# Patient Record
Sex: Male | Born: 1960 | Race: White | Hispanic: No | Marital: Single | Smoking: Never smoker
Health system: Southern US, Community
[De-identification: ages and names within clinical notes are randomized; demographics above are authoritative.]

## PROBLEM LIST (undated history)

## (undated) DIAGNOSIS — E119 Type 2 diabetes mellitus without complications: Secondary | ICD-10-CM

## (undated) DIAGNOSIS — H409 Unspecified glaucoma: Secondary | ICD-10-CM

## (undated) DIAGNOSIS — E785 Hyperlipidemia, unspecified: Secondary | ICD-10-CM

## (undated) DIAGNOSIS — I10 Essential (primary) hypertension: Secondary | ICD-10-CM

## (undated) HISTORY — PX: REPLACEMENT TOTAL KNEE BILATERAL: SUR1225

## (undated) HISTORY — PX: OTHER SURGICAL HISTORY: SHX169

## (undated) HISTORY — PX: TOTAL SHOULDER REPLACEMENT: SUR1217

---

## 2005-07-19 ENCOUNTER — Emergency Department: Payer: Self-pay | Admitting: Emergency Medicine

## 2017-02-18 ENCOUNTER — Inpatient Hospital Stay
Admission: EM | Admit: 2017-02-18 | Discharge: 2017-02-20 | DRG: 638 | Disposition: A | Discharge status: Home / Self Care | Payer: Medicare Other | Attending: Internal Medicine | Admitting: Internal Medicine

## 2017-02-18 ENCOUNTER — Encounter: Payer: Self-pay | Admitting: Emergency Medicine

## 2017-02-18 ENCOUNTER — Emergency Department: Payer: Medicare Other

## 2017-02-18 DIAGNOSIS — Z794 Long term (current) use of insulin: Secondary | ICD-10-CM

## 2017-02-18 DIAGNOSIS — E785 Hyperlipidemia, unspecified: Secondary | ICD-10-CM | POA: Diagnosis present

## 2017-02-18 DIAGNOSIS — Z96611 Presence of right artificial shoulder joint: Secondary | ICD-10-CM | POA: Diagnosis present

## 2017-02-18 DIAGNOSIS — I998 Other disorder of circulatory system: Secondary | ICD-10-CM | POA: Diagnosis present

## 2017-02-18 DIAGNOSIS — I10 Essential (primary) hypertension: Secondary | ICD-10-CM | POA: Diagnosis present

## 2017-02-18 DIAGNOSIS — L598 Other specified disorders of the skin and subcutaneous tissue related to radiation: Secondary | ICD-10-CM | POA: Diagnosis present

## 2017-02-18 DIAGNOSIS — T68XXXA Hypothermia, initial encounter: Secondary | ICD-10-CM | POA: Diagnosis present

## 2017-02-18 DIAGNOSIS — Y842 Radiological procedure and radiotherapy as the cause of abnormal reaction of the patient, or of later complication, without mention of misadventure at the time of the procedure: Secondary | ICD-10-CM | POA: Diagnosis present

## 2017-02-18 DIAGNOSIS — Z79899 Other long term (current) drug therapy: Secondary | ICD-10-CM

## 2017-02-18 DIAGNOSIS — R634 Abnormal weight loss: Secondary | ICD-10-CM | POA: Diagnosis present

## 2017-02-18 DIAGNOSIS — F1010 Alcohol abuse, uncomplicated: Secondary | ICD-10-CM | POA: Diagnosis present

## 2017-02-18 DIAGNOSIS — E876 Hypokalemia: Secondary | ICD-10-CM | POA: Diagnosis present

## 2017-02-18 DIAGNOSIS — G8929 Other chronic pain: Secondary | ICD-10-CM | POA: Diagnosis present

## 2017-02-18 DIAGNOSIS — I4581 Long QT syndrome: Secondary | ICD-10-CM | POA: Diagnosis present

## 2017-02-18 DIAGNOSIS — E11622 Type 2 diabetes mellitus with other skin ulcer: Secondary | ICD-10-CM | POA: Diagnosis present

## 2017-02-18 DIAGNOSIS — X58XXXA Exposure to other specified factors, initial encounter: Secondary | ICD-10-CM | POA: Diagnosis present

## 2017-02-18 DIAGNOSIS — L97919 Non-pressure chronic ulcer of unspecified part of right lower leg with unspecified severity: Secondary | ICD-10-CM | POA: Diagnosis present

## 2017-02-18 DIAGNOSIS — E162 Hypoglycemia, unspecified: Secondary | ICD-10-CM

## 2017-02-18 DIAGNOSIS — E11649 Type 2 diabetes mellitus with hypoglycemia without coma: Secondary | ICD-10-CM | POA: Diagnosis present

## 2017-02-18 DIAGNOSIS — Z96653 Presence of artificial knee joint, bilateral: Secondary | ICD-10-CM | POA: Diagnosis present

## 2017-02-18 DIAGNOSIS — H409 Unspecified glaucoma: Secondary | ICD-10-CM | POA: Diagnosis present

## 2017-02-18 HISTORY — DX: Unspecified glaucoma: H40.9

## 2017-02-18 HISTORY — DX: Hyperlipidemia, unspecified: E78.5

## 2017-02-18 HISTORY — DX: Essential (primary) hypertension: I10

## 2017-02-18 HISTORY — DX: Type 2 diabetes mellitus without complications: E11.9

## 2017-02-18 LAB — GLUCOSE, CAPILLARY
GLUCOSE-CAPILLARY: 138 mg/dL — AB (ref 65–99)
GLUCOSE-CAPILLARY: 70 mg/dL (ref 65–99)
GLUCOSE-CAPILLARY: 135 mg/dL — AB (ref 65–99)
GLUCOSE-CAPILLARY: 137 mg/dL — AB (ref 65–99)
GLUCOSE-CAPILLARY: 162 mg/dL — AB (ref 65–99)
GLUCOSE-CAPILLARY: 234 mg/dL — AB (ref 65–99)
Glucose-Capillary: 120 mg/dL — ABNORMAL HIGH (ref 65–99)
Glucose-Capillary: 124 mg/dL — ABNORMAL HIGH (ref 65–99)
Glucose-Capillary: 125 mg/dL — ABNORMAL HIGH (ref 65–99)
Glucose-Capillary: 151 mg/dL — ABNORMAL HIGH (ref 65–99)
Glucose-Capillary: 243 mg/dL — ABNORMAL HIGH (ref 65–99)
Glucose-Capillary: 255 mg/dL — ABNORMAL HIGH (ref 65–99)
Glucose-Capillary: 29 mg/dL — CL (ref 65–99)

## 2017-02-18 LAB — CBC
HEMATOCRIT: 50.1 % (ref 40.0–52.0)
Hemoglobin: 17.5 g/dL (ref 13.0–18.0)
MCH: 32 pg (ref 26.0–34.0)
MCHC: 35 g/dL (ref 32.0–36.0)
MCV: 91.4 fL (ref 80.0–100.0)
Platelets: 194 10*3/uL (ref 150–440)
RBC: 5.48 MIL/uL (ref 4.40–5.90)
RDW: 13.8 % (ref 11.5–14.5)
WBC: 9.8 10*3/uL (ref 3.8–10.6)

## 2017-02-18 LAB — URINE DRUG SCREEN, QUALITATIVE (ARMC ONLY)
Amphetamines, Ur Screen: NOT DETECTED
Barbiturates, Ur Screen: NOT DETECTED
Benzodiazepine, Ur Scrn: NOT DETECTED
CANNABINOID 50 NG, UR ~~LOC~~: NOT DETECTED
COCAINE METABOLITE, UR ~~LOC~~: NOT DETECTED
MDMA (ECSTASY) UR SCREEN: NOT DETECTED
Methadone Scn, Ur: NOT DETECTED
OPIATE, UR SCREEN: POSITIVE — AB
PHENCYCLIDINE (PCP) UR S: NOT DETECTED
Tricyclic, Ur Screen: POSITIVE — AB

## 2017-02-18 LAB — COMPREHENSIVE METABOLIC PANEL
ALBUMIN: 4.8 g/dL (ref 3.5–5.0)
ALT: 40 U/L (ref 17–63)
AST: 39 U/L (ref 15–41)
Alkaline Phosphatase: 84 U/L (ref 38–126)
Anion gap: 9 (ref 5–15)
BILIRUBIN TOTAL: 1.2 mg/dL (ref 0.3–1.2)
BUN: 10 mg/dL (ref 6–20)
CO2: 29 mmol/L (ref 22–32)
Calcium: 9.2 mg/dL (ref 8.9–10.3)
Chloride: 100 mmol/L — ABNORMAL LOW (ref 101–111)
Creatinine, Ser: 0.44 mg/dL — ABNORMAL LOW (ref 0.61–1.24)
GFR calc Af Amer: >60 mL/min (ref >60)
GFR calc non Af Amer: >60 mL/min (ref >60)
GLUCOSE: 63 mg/dL — AB (ref 65–99)
POTASSIUM: 2.9 mmol/L — AB (ref 3.5–5.1)
Sodium: 138 mmol/L (ref 135–145)
TOTAL PROTEIN: 8.5 g/dL — AB (ref 6.5–8.1)

## 2017-02-18 LAB — URINALYSIS, COMPLETE (UACMP) WITH MICROSCOPIC
BACTERIA UA: NONE SEEN
Bilirubin Urine: NEGATIVE
Glucose, UA: >=500 mg/dL — AB
Hgb urine dipstick: NEGATIVE
Ketones, ur: NEGATIVE mg/dL
Leukocytes, UA: NEGATIVE
Nitrite: NEGATIVE
Protein, ur: 100 mg/dL — AB
SPECIFIC GRAVITY, URINE: 1.009 (ref 1.005–1.030)
SQUAMOUS EPITHELIAL / LPF: NONE SEEN
pH: 7 (ref 5.0–8.0)

## 2017-02-18 LAB — BLOOD GAS, VENOUS
Acid-base deficit: 0 mmol/L (ref 0.0–2.0)
BICARBONATE: 26 mmol/L (ref 20.0–28.0)
O2 Saturation: 89 %
PH VEN: 7.36 (ref 7.250–7.430)
PO2 VEN: 59 mmHg — AB (ref 32.0–45.0)
Patient temperature: 37
pCO2, Ven: 46 mmHg (ref 44.0–60.0)

## 2017-02-18 LAB — LACTIC ACID, PLASMA: LACTIC ACID, VENOUS: 1.2 mmol/L (ref 0.5–1.9)

## 2017-02-18 LAB — TROPONIN I: Troponin I: <0.03 ng/mL (ref <0.03)

## 2017-02-18 LAB — ETHANOL

## 2017-02-18 MED ORDER — POTASSIUM CHLORIDE CRYS ER 20 MEQ PO TBCR
40.0000 meq | EXTENDED_RELEASE_TABLET | Freq: Once | ORAL | Status: AC
  Administered 2017-02-18: 40 meq via ORAL
  Filled 2017-02-18: qty 2

## 2017-02-18 MED ORDER — DEXTROSE 50 % IV SOLN
1.0000 | Freq: Once | INTRAVENOUS | Status: AC
  Administered 2017-02-18: 50 mL via INTRAVENOUS

## 2017-02-18 MED ORDER — COLLAGENASE 250 UNIT/GM EX OINT
TOPICAL_OINTMENT | Freq: Every day | CUTANEOUS | Status: DC
  Administered 2017-02-18 – 2017-02-19 (×2): via TOPICAL
  Filled 2017-02-18: qty 30

## 2017-02-18 MED ORDER — MAGNESIUM SULFATE 2 GM/50ML IV SOLN
2.0000 g | Freq: Once | INTRAVENOUS | Status: AC
  Administered 2017-02-18: 2 g via INTRAVENOUS

## 2017-02-18 MED ORDER — DEXTROSE 5 % IV SOLN
2.0000 g | Freq: Once | INTRAVENOUS | Status: AC
  Administered 2017-02-18: 2 g via INTRAVENOUS
  Filled 2017-02-18: qty 2

## 2017-02-18 MED ORDER — ACETAMINOPHEN 325 MG PO TABS
650.0000 mg | ORAL_TABLET | Freq: Four times a day (QID) | ORAL | Status: DC | PRN
  Administered 2017-02-18: 12:00:00 650 mg via ORAL
  Filled 2017-02-18: qty 2

## 2017-02-18 MED ORDER — VITAMIN B-1 100 MG PO TABS
100.0000 mg | ORAL_TABLET | Freq: Every day | ORAL | Status: DC
  Administered 2017-02-19 – 2017-02-20 (×2): 100 mg via ORAL
  Filled 2017-02-18 (×2): qty 1

## 2017-02-18 MED ORDER — DORZOLAMIDE HCL-TIMOLOL MAL PF 22.3-6.8 MG/ML OP SOLN
1.0000 [drp] | Freq: Two times a day (BID) | OPHTHALMIC | Status: DC
  Administered 2017-02-18 – 2017-02-20 (×5): 1 [drp] via OPHTHALMIC
  Filled 2017-02-18 (×6): qty 0.1

## 2017-02-18 MED ORDER — POTASSIUM CHLORIDE 2 MEQ/ML IV SOLN
30.0000 meq | Freq: Once | INTRAVENOUS | Status: AC
  Administered 2017-02-18: 30 meq via INTRAVENOUS
  Filled 2017-02-18: qty 15

## 2017-02-18 MED ORDER — LORAZEPAM 1 MG PO TABS
1.0000 mg | ORAL_TABLET | Freq: Four times a day (QID) | ORAL | Status: DC | PRN
  Administered 2017-02-18: 21:00:00 1 mg via ORAL
  Filled 2017-02-18: qty 1

## 2017-02-18 MED ORDER — LORAZEPAM 2 MG/ML IJ SOLN: 1.0000 mg | Freq: Four times a day (QID) | INTRAMUSCULAR | Status: DC | PRN

## 2017-02-18 MED ORDER — DEXTROSE 10 % IV SOLN
INTRAVENOUS | Status: DC
  Administered 2017-02-18: 07:00:00 via INTRAVENOUS

## 2017-02-18 MED ORDER — DEXTROSE 50 % IV SOLN
INTRAVENOUS | Status: AC
  Administered 2017-02-18: 50 mL via INTRAVENOUS
  Filled 2017-02-18: qty 50

## 2017-02-18 MED ORDER — THIAMINE HCL 100 MG/ML IJ SOLN
100.0000 mg | INTRAMUSCULAR | Status: AC
  Administered 2017-02-18: 100 mg via INTRAVENOUS

## 2017-02-18 MED ORDER — MAGNESIUM SULFATE 2 GM/50ML IV SOLN
INTRAVENOUS | Status: AC
  Filled 2017-02-18: qty 50

## 2017-02-18 MED ORDER — PANTOPRAZOLE SODIUM 40 MG PO TBEC
40.0000 mg | DELAYED_RELEASE_TABLET | Freq: Every day | ORAL | Status: DC
  Administered 2017-02-18 – 2017-02-20 (×3): 40 mg via ORAL
  Filled 2017-02-18 (×3): qty 1

## 2017-02-18 MED ORDER — THIAMINE HCL 100 MG/ML IJ SOLN
INTRAMUSCULAR | Status: AC
  Filled 2017-02-18: qty 2

## 2017-02-18 MED ORDER — DEXTROSE 5 % IV SOLN
2.0000 g | Freq: Two times a day (BID) | INTRAVENOUS | Status: DC
  Administered 2017-02-18: 2 g via INTRAVENOUS
  Filled 2017-02-18 (×3): qty 2

## 2017-02-18 MED ORDER — MORPHINE SULFATE 15 MG PO TABS
30.0000 mg | ORAL_TABLET | Freq: Two times a day (BID) | ORAL | Status: DC | PRN
  Administered 2017-02-18 – 2017-02-20 (×2): 30 mg via ORAL
  Filled 2017-02-18 (×2): qty 2

## 2017-02-18 MED ORDER — SENNOSIDES-DOCUSATE SODIUM 8.6-50 MG PO TABS
1.0000 | ORAL_TABLET | Freq: Every day | ORAL | Status: DC
  Administered 2017-02-19 – 2017-02-20 (×2): 1 via ORAL
  Filled 2017-02-18 (×2): qty 1

## 2017-02-18 MED ORDER — VANCOMYCIN HCL 500 MG IV SOLR
500.0000 mg | INTRAVENOUS | Status: AC
  Administered 2017-02-18: 13:00:00 500 mg via INTRAVENOUS
  Filled 2017-02-18: qty 500

## 2017-02-18 MED ORDER — PNEUMOCOCCAL VAC POLYVALENT 25 MCG/0.5ML IJ INJ: 0.5000 mL | INJECTION | INTRAMUSCULAR | Status: DC

## 2017-02-18 MED ORDER — THIAMINE HCL 100 MG/ML IJ SOLN: 100.0000 mg | Freq: Every day | INTRAMUSCULAR | Status: DC

## 2017-02-18 MED ORDER — FOLIC ACID 1 MG PO TABS
1.0000 mg | ORAL_TABLET | Freq: Every day | ORAL | Status: DC
  Administered 2017-02-18 – 2017-02-20 (×3): 1 mg via ORAL
  Filled 2017-02-18 (×3): qty 1

## 2017-02-18 MED ORDER — VANCOMYCIN HCL IN DEXTROSE 1-5 GM/200ML-% IV SOLN
1000.0000 mg | Freq: Three times a day (TID) | INTRAVENOUS | Status: DC
  Administered 2017-02-18 – 2017-02-19 (×2): 1000 mg via INTRAVENOUS
  Filled 2017-02-18 (×5): qty 200

## 2017-02-18 MED ORDER — ACETAMINOPHEN 650 MG RE SUPP: 650.0000 mg | Freq: Four times a day (QID) | RECTAL | Status: DC | PRN

## 2017-02-18 MED ORDER — OXYCODONE HCL 5 MG PO TABS
5.0000 mg | ORAL_TABLET | Freq: Four times a day (QID) | ORAL | Status: DC | PRN
  Administered 2017-02-18 – 2017-02-19 (×4): 5 mg via ORAL
  Filled 2017-02-18 (×4): qty 1

## 2017-02-18 MED ORDER — ENOXAPARIN SODIUM 40 MG/0.4ML ~~LOC~~ SOLN
40.0000 mg | SUBCUTANEOUS | Status: DC
  Administered 2017-02-18 – 2017-02-19 (×2): 40 mg via SUBCUTANEOUS
  Filled 2017-02-18 (×2): qty 0.4

## 2017-02-18 MED ORDER — ONDANSETRON HCL 4 MG/2ML IJ SOLN
4.0000 mg | Freq: Four times a day (QID) | INTRAMUSCULAR | Status: DC | PRN
  Administered 2017-02-18: 11:00:00 4 mg via INTRAVENOUS
  Filled 2017-02-18: qty 2

## 2017-02-18 MED ORDER — VANCOMYCIN HCL 500 MG IV SOLR
500.0000 mg | INTRAVENOUS | Status: DC
  Filled 2017-02-18: qty 500

## 2017-02-18 MED ORDER — ADULT MULTIVITAMIN W/MINERALS CH
1.0000 | ORAL_TABLET | Freq: Every day | ORAL | Status: DC
  Administered 2017-02-18 – 2017-02-20 (×3): 1 via ORAL
  Filled 2017-02-18 (×3): qty 1

## 2017-02-18 MED ORDER — VANCOMYCIN HCL IN DEXTROSE 1-5 GM/200ML-% IV SOLN
1000.0000 mg | Freq: Once | INTRAVENOUS | Status: AC
  Administered 2017-02-18: 11:00:00 1000 mg via INTRAVENOUS
  Filled 2017-02-18: qty 200

## 2017-02-18 NOTE — Progress Notes (Signed)
Complete dressing change done to right lower leg ulceration

## 2017-02-18 NOTE — ED Notes (Signed)
Patient denies pain and is resting comfortably.  

## 2017-02-18 NOTE — Consult Note (Signed)
WOC Nurse wound consult note Reason for Consult:1. Ischemic leg ulcer, with fat layer exposed  2. 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  Wound type:vascular/radionecrosis Pressure Injury POA: n/a Measurement: 6 cm x 3 cm x 0.3 cm  Wound bed: devitalized tissue Drainage (amount, consistency, odor) moderate purulent  Foul odor Periwound: edema, erythema, tenderness Dressing procedure/placement/frequency:[facility]  Not available on formulary.  Due to devitalized tissue, will implement enzymatic debridement.  Receiving IV Maxipime.   Cleanse wound to right lateral lower leg with NS.  Apply Santyl to wound bed.  Cover with NS moist gauze.  Cover with ABD pad and kelrix.  Change daily.  Will not follow at this time.  Please re-consult if needed.  Maple HudsonKaren Tressia Labrum RN BSN CWON Pager [phone]

## 2017-02-18 NOTE — Progress Notes (Signed)
Inpatient Diabetes Program Recommendations  AACE/ADA: New Consensus Statement on Inpatient Glycemic Control (2015)  Target Ranges:  Prepandial:   less than 140 mg/dL      Peak postprandial:   less than 180 mg/dL (1-2 hours)      Critically ill patients:  140 - 180 mg/dL   Lab Results  Component Value Date   GLUCAP 124 (H) 02/18/2017    Review of Glycemic Control: Results for Isaac Morrison, Isaac Morrison (MRN [id number]) as of 02/18/2017 09:39  Ref. Range 02/18/2017 05:54 02/18/2017 07:23 02/18/2017 07:24 02/18/2017 07:52 02/18/2017 09:01  Glucose-Capillary Latest Ref Range: 65 - 99 mg/dL 409138 (H) 26 (LL) 29 (LL) 151 (H) 124 (H)   Results for Isaac Morrison, [name] (MRN [id number]) as of 02/18/2017 14:18  Ref. Range 02/18/2017 09:25 02/18/2017 10:44 02/18/2017 12:12  Glucose-Capillary Latest Ref Range: 65 - 99 mg/dL 956120 (H) 70 213125 (H)    Diabetes history: Type 2 diabetes- Last A1C per chart review was 5.8% indicating average glucose 120 mg/dL Outpatient Diabetes medications: 70/30 66 units bid Current orders for Inpatient glycemic control:  D10 at 50 cc/hr with hourly blood glucose monitoring  Inpatient Diabetes Program Recommendations:    Attempted to speak with patient however he states he is in pain.  I was able to confirm that patient was on 70/30 66 units bid.  When I asked him if he ate yesterday, he stated "I don't know". Stepmother was at the bedside and stated that brother was unable to wake patient up on the couch this morning, so he called 911.  She states that he was in the hospital in February for low blood sugars, however I do not see any visits in "Care Everywhere"?  Based on patient's A1C and admitting diagnosis of "Hypoglycemia", he will need reduction in home insulin.  Further if patient's intake is not consistent, may do better with insulin that is not pre-mixed since these insulins include meal coverage and basal in one injection.  Will follow.  Beryl MeagerJenny Ardis Lawley, RN, BC-ADM Inpatient Diabetes Coordinator Pager  [phone] (8a-5p)

## 2017-02-18 NOTE — ED Provider Notes (Signed)
[facility] Emergency Department Provider Note _   First MD Initiated Contact with Patient 02/18/17 [phone]     (approximate)  I have reviewed the triage vital signs and the nursing notes.   HISTORY  Chief Complaint Hypoglycemia   HPI Isaac Morrison is a 56 y.o. male with history diabetes presents via EMS. Per EMS call was placed secondary to altered mental status. On their arrival the patient's glucose noted to be 31 and a such 2 Amp of D50 was administered glucose 150. EMS also noted that the patient's temperature on arrival was "90". Patient denies any complaints at this time. Patient stepped are noted to be 93.1 on arrival to the emergency department. Glucose on arrival 137   Past Medical History:  Diagnosis Date  . Diabetes mellitus without complication (HCC)     There are no active problems to display for this patient.   History reviewed. No pertinent surgical history.  Prior to Admission medications   Not on File    Allergies Patient has no known allergies.  History reviewed. No pertinent family history.  Social History Social History  Substance Use Topics  . Smoking status: Never Smoker  . Smokeless tobacco: Never Used  . Alcohol use No    Review of Systems Constitutional: No fever/chills Eyes: No visual changes. ENT: No sore throat. Cardiovascular: Denies chest pain. Respiratory: Denies shortness of breath. Gastrointestinal: No abdominal pain.  No nausea, no vomiting.  No diarrhea.  No constipation. Genitourinary: Negative for dysuria. Musculoskeletal: Negative for back pain. Skin: Negative for rash. Neurological: Negative for headaches, focal weakness or numbness. Endocrine:Positive for hypoglycemia  Hematological/Lymphatic:  10-point ROS otherwise negative.  ____________________________________________   PHYSICAL EXAM:  VITAL SIGNS: ED Triage Vitals [02/18/17 0600]  Enc Vitals Group     BP (!) 169/99     Pulse  Rate 77     Resp 17     Temp (!) 93.1 F (33.9 C)     Temp Source Rectal     SpO2 98 %     Weight 189 lb 8 oz (86 kg)     Height      Head Circumference      Peak Flow      Pain Score      Pain Loc      Pain Edu?      Excl. in GC?     Constitutional: Alert and oriented. Well appearing and in no acute distress. Eyes: Conjunctivae are normal. PERRL. EOMI. Head: Atraumatic. Ears:  Healthy appearing ear canals and TMs bilaterally Nose: No congestion/rhinnorhea. Mouth/Throat: Mucous membranes are moist.  Oropharynx non-erythematous. Neck: No stridor.  No meningeal signs.  No cervical spine tenderness to palpation. Cardiovascular: Normal rate, regular rhythm. Good peripheral circulation. Grossly normal heart sounds. Respiratory: Normal respiratory effort.  No retractions. Lungs CTAB. Gastrointestinal: Soft and nontender. No distention.  Musculoskeletal: No lower extremity tenderness nor edema. No gross deformities of extremities. Neurologic:  Normal speech and language. No gross focal neurologic deficits are appreciated.  Skin:  Skin is warm, dry and intact. No rash noted. Psychiatric: Mood and affect are normal. Speech and behavior are normal.  ____________________________________________   LABS (all labs ordered are listed, but only abnormal results are displayed)  Labs Reviewed  COMPREHENSIVE METABOLIC PANEL - Abnormal; Notable for the following:       Result Value   Potassium 2.9 (*)    Chloride 100 (*)    Glucose, Bld 63 (*)  Creatinine, Ser 0.44 (*)    Total Protein 8.5 (*)    All other components within normal limits  URINE DRUG SCREEN, QUALITATIVE ([facility] ONLY) - Abnormal; Notable for the following:    Tricyclic, Ur Screen POSITIVE (*)    Opiate, Ur Screen POSITIVE (*)    All other components within normal limits  URINALYSIS, COMPLETE (UACMP) WITH MICROSCOPIC - Abnormal; Notable for the following:    Color, Urine YELLOW (*)    APPearance CLEAR (*)    Glucose, UA  >=500 (*)    Protein, ur 100 (*)    All other components within normal limits  CULTURE, BLOOD (SINGLE)  CBC  TROPONIN I  ETHANOL  LACTIC ACID, PLASMA  LACTIC ACID, PLASMA  BLOOD GAS, VENOUS  CBG MONITORING, ED   ____________________________________________  EKG  ED ECG REPORT I, Clyde N Landyn Buckalew, the attending physician, personally viewed and interpreted this ECG.   Date: 02/18/2017  EKG Time: 6:02 AM  Rate: 87  Rhythm: Normal sinus rhythm  Axis: Normal  Intervals: Normal  ST&T Change: None  ____________________________________________  RADIOLOGY I, [place] N Ebone Alcivar, personally viewed and evaluated these images (plain radiographs) as part of my medical decision making, as well as reviewing the written report by the radiologist.  Dg Chest Port 1 View  Result Date: 02/18/2017 CLINICAL DATA:  Hypothermia EXAM: PORTABLE CHEST 1 VIEW COMPARISON:  None. FINDINGS: A single AP portable view of the chest demonstrates no focal airspace consolidation or alveolar edema. The lungs are grossly clear. There is no large effusion or pneumothorax. Cardiac and mediastinal contours appear unremarkable. IMPRESSION: No active disease. Electronically Signed   By: Ellery Plunk M.D.   On: 02/18/2017 06:36      Procedures   Critical Care performed:CRITICAL CARE Performed by: Darci Current   Total critical care time: 30 minutes  Critical care time was exclusive of separately billable procedures and treating other patients.  Critical care was necessary to treat or prevent imminent or life-threatening deterioration.  Critical care was time spent personally by me on the following activities: development of treatment plan with patient and/or surrogate as well as nursing, discussions with consultants, evaluation of patient's response to treatment, examination of patient, obtaining history from patient or surrogate, ordering and performing treatments and interventions, ordering and review  of laboratory studies, ordering and review of radiographic studies, pulse oximetry and re-evaluation of patient's condition.  ___________________   INITIAL IMPRESSION / ASSESSMENT AND PLAN / ED COURSE  Pertinent labs & imaging results that were available during my care of the patient were reviewed by me and considered in my medical decision making (see chart for details).  Glucose decreased again the 26 and a such an amp of D50 was given D10 normal saline at per hour started the patient current temperature is 95 degrees bear hugger in place      ____________________________________________  FINAL CLINICAL IMPRESSION(S) / ED DIAGNOSES  Final diagnoses:  Hypoglycemia  Hypothermia, initial encounter     MEDICATIONS GIVEN DURING THIS VISIT:  Medications - No data to display   NEW OUTPATIENT MEDICATIONS STARTED DURING THIS VISIT:  New Prescriptions   No medications on file    Modified Medications   No medications on file    Discontinued Medications   No medications on file     Note:  This document was prepared using Dragon voice recognition software and may include unintentional dictation errors.    Darci Current, MD 02/18/17 [phone]

## 2017-02-18 NOTE — H&P (Signed)
[facility] [place] [facility]   PATIENT NAME: Isaac Morrison    MR#:  [id number]  DATE OF BIRTH:  1961/09/27  DATE OF ADMISSION:  02/18/2017  PRIMARY CARE PHYSICIAN: Gilmer Mor   REQUESTING/REFERRING PHYSICIAN: Dr. Manson Passey  CHIEF COMPLAINT:   Chief Complaint  Patient presents with  . Hypoglycemia    HISTORY OF PRESENT ILLNESS:  Joffre Lucks  is a 56 y.o. male with a known history of Diabetes, chronic pain. Was unresponsive this morning and unable to be woken up by family. He was seen well in the evening last night. At 4:30 AM family so the TV and lites were on in the oven was on. They were unable to wake him up and called 911. Patient was found to be hypothermic and hypoglycemic in the ER and started on a D10 drip for recurrent low sugars. The patient is oriented at this time. 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 Is feeling thirsty and his leg hurts. Patient also drinks 6-7 beers per day. He was cold yesterday and was wearing an electric blanket. He also complains of some weight loss  PAST MEDICAL HISTORY:   Past Medical History:  Diagnosis Date  . Diabetes mellitus without complication (HCC)   . Glaucoma   . Hyperlipidemia   . Hypertension     PAST SURGICAL HISTORY:   Past Surgical History:  Procedure Laterality Date  . REPLACEMENT TOTAL KNEE BILATERAL Bilateral   . Skin grafts    . TOTAL SHOULDER REPLACEMENT Right     SOCIAL HISTORY:   Social History  Substance Use Topics  . Smoking status: Never Smoker  . Smokeless tobacco: Never Used  . Alcohol use 29.4 oz/week    49 Cans of beer per week    FAMILY HISTORY:   Family History  Problem Relation Age of Onset  . Alzheimer's disease Father     DRUG ALLERGIES:  No Known Allergies  REVIEW OF SYSTEMS:  CONSTITUTIONAL: No fever, Cold yesterday wearing an electric blanket. Positive for weight loss. Some fatigue. Some headache. EYES: No blurred or  double vision. Some right eye pain EARS, NOSE, AND THROAT: No tinnitus or ear pain. No sore throat RESPIRATORY: No cough, shortness of breath, wheezing or hemoptysis.  CARDIOVASCULAR: No chest pain, orthopnea, edema.  GASTROINTESTINAL: No nausea, vomiting, diarrhea or abdominal pain. No blood in bowel movements GENITOURINARY: No dysuria, hematuria.  ENDOCRINE: No polyuria, nocturia,  HEMATOLOGY: No anemia, easy bruising or bleeding SKIN: No rash or lesion. MUSCULOSKELETAL: Right leg pain, bilateral knee pain NEUROLOGIC: No tingling, numbness, weakness.  PSYCHIATRY: No anxiety or depression.   MEDICATIONS AT HOME:   Prior to Admission medications   Medication Sig Start Date End Date Taking? Authorizing Provider  benazepril (LOTENSIN) 20 MG tablet Take 20 mg by mouth daily. 04/08/13  Yes Historical Provider, MD  dorzolamide-timolol (COSOPT) 22.3-6.8 MG/ML ophthalmic solution Place 1 drop into both eyes 2 (two) times daily. 01/16/17  Yes Historical Provider, MD  fenofibrate (TRICOR) 145 MG tablet Take 145 mg by mouth daily.   Yes Historical Provider, MD  hydrochlorothiazide (HYDRODIURIL) 25 MG tablet Take 25 mg by mouth daily. 04/08/13  Yes Historical Provider, MD  insulin NPH-regular Human (HUMULIN 70/30) (70-30) 100 UNIT/ML injection Inject 66 Units into the skin 2 (two) times daily with a meal. 04/25/16  Yes Historical Provider, MD  morphine (MSIR) 30 MG tablet Take 60 mg by mouth 2 (two) times daily as needed for pain.  01/29/17  Yes Historical Provider, MD  Omega-3 Fatty Acids (FISH OIL) 1000 MG CAPS Take 2,000 mg by mouth 4 (four) times daily.   Yes Historical Provider, MD  omeprazole (PRILOSEC) 20 MG capsule Take 20 mg by mouth daily. 12/12/16  Yes Historical Provider, MD  pravastatin (PRAVACHOL) 80 MG tablet Take 80 mg by mouth at bedtime. 12/04/16  Yes Historical Provider, MD  senna-docusate (SENOKOT-S) 8.6-50 MG tablet Take 1 tablet by mouth daily. 10/15/16 10/15/17 Yes Historical Provider,  MD  tadalafil (CIALIS) 20 MG tablet Take 20 mg by mouth daily as needed. 09/08/13  Yes Historical Provider, MD      VITAL SIGNS:  Blood pressure (!) 146/99, pulse 77, temperature (!) 94.8 F (34.9 C), resp. rate 15, weight 86 kg (189 lb 8 oz), SpO2 99 %.  PHYSICAL EXAMINATION:  GENERAL:  56 y.o.-year-old patient lying in the bed with no acute distress.  EYES: Pupils equal, round, reactive to light and accommodation. No scleral icterus. Extraocular muscles intact.  HEENT: Head atraumatic, normocephalic. Oropharynx and nasopharynx clear.  NECK:  Supple, no jugular venous distention. No thyroid enlargement, no tenderness.  LUNGS: Normal breath sounds bilaterally, no wheezing, rales,rhonchi or crepitation. No use of accessory muscles of respiration.  CARDIOVASCULAR: S1, S2 normal. No murmurs, rubs, or gallops.  ABDOMEN: Soft, nontender, nondistended. Bowel sounds present. No organomegaly or mass.  EXTREMITIES: No pedal edema, cyanosis, or clubbing.  NEUROLOGIC: Cranial nerves II through XII are intact. Muscle strength 5/5 in all extremities. Sensation intact. Gait not checked.  PSYCHIATRIC: The patient is alert and oriented x 3.  SKIN: Right leg large ulceration with foul-smelling greenish discharge  LABORATORY PANEL:   CBC  Recent Labs Lab 02/18/17 0600  WBC 9.8  HGB 17.5  HCT 50.1  PLT 194   ------------------------------------------------------------------------------------------------------------------  Chemistries   Recent Labs Lab 02/18/17 0600  NA 138  K 2.9*  CL 100*  CO2 29  GLUCOSE 63*  BUN 10  CREATININE 0.44*  CALCIUM 9.2  AST 39  ALT 40  ALKPHOS 84  BILITOT 1.2   ------------------------------------------------------------------------------------------------------------------  Cardiac Enzymes  Recent Labs Lab 02/18/17 0600  TROPONINI <0.03    ------------------------------------------------------------------------------------------------------------------  RADIOLOGY:  Dg Chest Port 1 View  Result Date: 02/18/2017 CLINICAL DATA:  Hypothermia EXAM: PORTABLE CHEST 1 VIEW COMPARISON:  None. FINDINGS: A single AP portable view of the chest demonstrates no focal airspace consolidation or alveolar edema. The lungs are grossly clear. There is no large effusion or pneumothorax. Cardiac and mediastinal contours appear unremarkable. IMPRESSION: No active disease. Electronically Signed   By: Ellery Plunk M.D.   On: 02/18/2017 06:36    EKG:   Normal sinus rhythm 87 bpm  IMPRESSION AND PLAN:   1. Severe hypoglycemia. Patient states he only takes 7030 insulin 60 units twice a day. Check a hemoglobin A1c. Currently on D10 drip. Check fingersticks every 1. 2. Hypothermia. Rule out infection get blood cultures 2. Empiric antibiotics for right leg wound infection. Send off a wound culture. 3. Hypokalemia. Replace potassium orally and IV. Replace magnesium also. 4. Alcohol abuse. Must stop alcohol. CIWA protocol 5. Glaucoma continue eyedrops 6. Essential hypertension hold blood pressure medication at this time 7. Hyperlipidemia unspecified how cholesterol medication at this time 8. Chronic pain decreased pain medication dose 9. Right lower extremity wound. Cover at this time. Send a culture. Empiric antibiotics.   All the records are reviewed and case discussed with ED provider. Management plans discussed with the patient, family and they are  in agreement.  CODE STATUS: Full code  TOTAL TIME TAKING CARE OF THIS PATIENT: 55 minutes.    Alford HighlandWIETING, Deforrest Bogle M.D on 02/18/2017 at 7:59 AM  Between 7am to 6pm - Pager - [phone]  After 6pm call admission pager [phone]  [facility] Office  [phone]  CC: Primary care physician; Gilmer Moronald Fleming

## 2017-02-18 NOTE — ED Notes (Signed)
Bear Hugger and Rectal thermometer probe initiated.

## 2017-02-18 NOTE — ED Notes (Signed)
AAOx3.  Skin warm and dry.  NAD 

## 2017-02-18 NOTE — ED Notes (Signed)
bair hugger removed 

## 2017-02-18 NOTE — ED Triage Notes (Addendum)
Pt arrived to ED by EMS from home where he resides with brother. EMS reports pt found by brother with AMS, CBG on arrival 231, after 2 AMP's of D50 CBG 150. EMS reports pt has "cognitive disabilities," unknown per EMS. Pts family in route to ED. Pt rectal temp 93.72F.

## 2017-02-19 LAB — GLUCOSE, CAPILLARY
GLUCOSE-CAPILLARY: 26 mg/dL — AB (ref 65–99)
GLUCOSE-CAPILLARY: 187 mg/dL — AB (ref 65–99)
Glucose-Capillary: 149 mg/dL — ABNORMAL HIGH (ref 65–99)
Glucose-Capillary: 186 mg/dL — ABNORMAL HIGH (ref 65–99)
Glucose-Capillary: 221 mg/dL — ABNORMAL HIGH (ref 65–99)
Glucose-Capillary: 170 mg/dL — ABNORMAL HIGH (ref 65–99)
Glucose-Capillary: 129 mg/dL — ABNORMAL HIGH (ref 65–99)

## 2017-02-19 LAB — HIV ANTIBODY (ROUTINE TESTING W REFLEX): HIV Screen 4th Generation wRfx: NONREACTIVE

## 2017-02-19 LAB — BASIC METABOLIC PANEL
Anion gap: 6 (ref 5–15)
BUN: 11 mg/dL (ref 6–20)
CO2: 24 mmol/L (ref 22–32)
Calcium: 9 mg/dL (ref 8.9–10.3)
Chloride: 105 mmol/L (ref 101–111)
Creatinine, Ser: 0.69 mg/dL (ref 0.61–1.24)
GFR calc Af Amer: >60 mL/min (ref >60)
GLUCOSE: 188 mg/dL — AB (ref 65–99)
POTASSIUM: 4.2 mmol/L (ref 3.5–5.1)
Sodium: 135 mmol/L (ref 135–145)

## 2017-02-19 LAB — HEMOGLOBIN A1C
HEMOGLOBIN A1C: 5.5 % (ref 4.8–5.6)
MEAN PLASMA GLUCOSE: 111 mg/dL

## 2017-02-19 LAB — MAGNESIUM: MAGNESIUM: 2.2 mg/dL (ref 1.7–2.4)

## 2017-02-19 MED ORDER — INSULIN ASPART 100 UNIT/ML ~~LOC~~ SOLN: 0.0000 [IU] | Freq: Every day | SUBCUTANEOUS | Status: DC

## 2017-02-19 MED ORDER — DICLOXACILLIN SODIUM 500 MG PO CAPS
500.0000 mg | ORAL_CAPSULE | Freq: Three times a day (TID) | ORAL | Status: DC
  Administered 2017-02-19 – 2017-02-20 (×3): 500 mg via ORAL
  Filled 2017-02-19 (×4): qty 1

## 2017-02-19 MED ORDER — ENSURE ENLIVE PO LIQD
237.0000 mL | Freq: Two times a day (BID) | ORAL | Status: DC
  Administered 2017-02-20: 237 mL via ORAL

## 2017-02-19 MED ORDER — INSULIN ASPART 100 UNIT/ML ~~LOC~~ SOLN
0.0000 [IU] | Freq: Three times a day (TID) | SUBCUTANEOUS | Status: DC
  Administered 2017-02-19: 2 [IU] via SUBCUTANEOUS
  Filled 2017-02-19: qty 2

## 2017-02-19 NOTE — Progress Notes (Signed)
Responded to bed alarm. Pt. Educated Reason asked to call before walking, effects of hypoglycemia. Asked to Call before getting up and walking. Patient Stated. I'm not going to" When asked the reason. He said "cause Im going home, I'm walking on out of here if they don't let me. I explained to patient he was a fall risk if he were to fall he would be here even longer patient still refuse to call be getting up out of bed.

## 2017-02-19 NOTE — Progress Notes (Signed)
Patient ID: Isaac Morrison, male   DOB: 1961-07-15, 56 y.o.   MRN: [id number]  [facility] PROGRESS NOTE  Isaac Morrison WUJ:[id number] DOB: 08-02-1961 DOA: 02/18/2017 PCP: No PCP Per Patient  HPI/Subjective: Patient felt fine and wanted to go home. Patient's hemoglobin A1c was 5.5. Obviously the patient does not need 66 units twice a day of 70/30 insulin. So far just watching his sugars off insulin, I'm not quite sure what yet to prescribe.  Objective: Vitals:   02/19/17 0359 02/19/17 1337  BP: (!) 113/59 122/71  Pulse: 90 81  Resp: (!) 22 18  Temp: 98.7 F (37.1 C) 98.8 F (37.1 C)    Filed Weights   02/18/17 0600 02/18/17 1007  Weight: 86 kg (189 lb 8 oz) 85.2 kg (187 lb 14.4 oz)    ROS: Review of Systems  Constitutional: Negative for chills and fever.  Eyes: Negative for blurred vision.  Respiratory: Negative for cough and shortness of breath.   Cardiovascular: Negative for chest pain.  Gastrointestinal: Negative for abdominal pain, constipation, diarrhea, nausea and vomiting.  Genitourinary: Negative for dysuria.  Musculoskeletal: Negative for joint pain.  Neurological: Negative for dizziness and headaches.   Exam: Physical Exam  Constitutional: He is oriented to person, place, and time.  HENT:  Nose: No mucosal edema.  Mouth/Throat: No oropharyngeal exudate or posterior oropharyngeal edema.  Eyes: Conjunctivae, EOM and lids are normal. Pupils are equal, round, and reactive to light.  Neck: No JVD present. Carotid bruit is not present. No edema present. No thyroid mass and no thyromegaly present.  Cardiovascular: S1 normal and S2 normal.  Exam reveals no gallop.   No murmur heard. Pulses:      Dorsalis pedis pulses are 2+ on the right side, and 2+ on the left side.  Respiratory: No respiratory distress. He has no wheezes. He has no rhonchi. He has no rales.  GI: Soft. Bowel sounds are normal. There is no tenderness.  Musculoskeletal:       Right ankle: He  exhibits no swelling.       Left ankle: He exhibits no swelling.  Lymphadenopathy:    He has no cervical adenopathy.  Neurological: He is alert and oriented to person, place, and time. No cranial nerve deficit.  Skin: Skin is warm. Nails show no clubbing.  Large ulcer left leg with some drainage  Psychiatric: He has a normal mood and affect.      Data Reviewed: Basic Metabolic Panel:  Recent Labs Lab 02/18/17 0600 02/19/17 0435  NA 138 135  K 2.9* 4.2  CL 100* 105  CO2 29 24  GLUCOSE 63* 188*  BUN 10 11  CREATININE 0.44* 0.69  CALCIUM 9.2 9.0  MG  --  2.2   Liver Function Tests:  Recent Labs Lab 02/18/17 0600  AST 39  ALT 40  ALKPHOS 84  BILITOT 1.2  PROT 8.5*  ALBUMIN 4.8   CBC:  Recent Labs Lab 02/18/17 0600  WBC 9.8  HGB 17.5  HCT 50.1  MCV 91.4  PLT 194   Cardiac Enzymes:  Recent Labs Lab 02/18/17 0600  TROPONINI <0.03    CBG:  Recent Labs Lab 02/18/17 2156 02/18/17 2353 02/19/17 0401 02/19/17 0737 02/19/17 1153  GLUCAP 234* 255* 187* 149* 186*    Recent Results (from the past 240 hour(s))  CULTURE, BLOOD (ROUTINE X 2) w Reflex to ID Panel     Status: None (Preliminary result)   Collection Time: 02/18/17  6:13 AM  Result Value  Ref Range Status   Specimen Description BLOOD L AC  Final   Special Requests BOTTLES DRAWN AEROBIC AND ANAEROBIC BCAV  Final   Culture NO GROWTH < 24 HOURS  Final   Report Status PENDING  Incomplete  CULTURE, BLOOD (ROUTINE X 2) w Reflex to ID Panel     Status: None (Preliminary result)   Collection Time: 02/18/17  6:13 AM  Result Value Ref Range Status   Specimen Description BLOOD R AC  Final   Special Requests BOTTLES DRAWN AEROBIC AND ANAEROBIC BCAV  Final   Culture NO GROWTH < 24 HOURS  Final   Report Status PENDING  Incomplete  Aerobic/Anaerobic Culture (surgical/deep wound)     Status: None (Preliminary result)   Collection Time: 02/18/17 11:31 AM  Result Value Ref Range Status   Specimen  Description LEG RIGHT LOWER LEG  Final   Special Requests NONE  Final   Gram Stain   Final    ABUNDANT WBC PRESENT,BOTH PMN AND MONONUCLEAR ABUNDANT GRAM VARIABLE ROD ABUNDANT GRAM POSITIVE COCCI IN PAIRS Performed at [facility], [address] [place], [place] [address]    Culture PENDING  Incomplete   Report Status PENDING  Incomplete     Studies: Dg Chest Port 1 View  Result Date: 02/18/2017 CLINICAL DATA:  Hypothermia EXAM: PORTABLE CHEST 1 VIEW COMPARISON:  None. FINDINGS: A single AP portable view of the chest demonstrates no focal airspace consolidation or alveolar edema. The lungs are grossly clear. There is no large effusion or pneumothorax. Cardiac and mediastinal contours appear unremarkable. IMPRESSION: No active disease. Electronically Signed   By: Ellery Plunk M.D.   On: 02/18/2017 06:36    Scheduled Meds: . collagenase   Topical Daily  . dicloxacillin  500 mg Oral Q8H  . dorzolamidel-timolol  1 drop Both Eyes BID  . enoxaparin (LOVENOX) injection  40 mg Subcutaneous Q24H  . feeding supplement (ENSURE ENLIVE)  237 mL Oral BID WC  . folic acid  1 mg Oral Daily  . multivitamin with minerals  1 tablet Oral Daily  . pantoprazole  40 mg Oral Daily  . pneumococcal 23 valent vaccine  0.5 mL Intramuscular Tomorrow-1000  . senna-docusate  1 tablet Oral Daily  . thiamine  100 mg Oral Daily   Or  . thiamine  100 mg Intravenous Daily    Assessment/Plan:  1. Hypoglycemia which was persistent required D10 drip. Right now sugars are acceptable off insulin. Hemoglobin A1c 5.5. Obviously he does not need 66 units of 70/30 insulin twice a day. He potentially can use a small dose of Lantus with short acting insulin prior to meals. But I want to watch a few more sugars first. Potential disposition tomorrow. 2. Hypothermia resolved 3. Hypokalemia improved with replacement 4. Alcohol abuse. Must stop alcohol. No signs of withdrawal today 5. Essential hypertension. Current  medications still on hold 6. Hyperlipidemia unspecified. Hold cholesterol medication 7. Chronic pain. Continue decreased pain medication dose 8. Right lower extremity wound. Change IV antibiotics to dicloxacillin.  Code Status:     Code Status Orders        Start     Ordered   02/18/17 0754  Full code  Continuous     02/18/17 0754    Code Status History    Date Active Date Inactive Code Status Order ID Comments User Context   This patient has a current code status but no historical code status.     Family Communication: Brother on  phone Disposition Plan: Home tomorrow  Antibiotics:  dicloxacillin  Time spent: 25minutes  Alford HighlandWIETING, Jessee Mezera  [employer]

## 2017-02-19 NOTE — Progress Notes (Signed)
Initial Nutrition Assessment  DOCUMENTATION CODES:   Not applicable  INTERVENTION:  Provide Ensure Enlive po BID, each supplement provides 350 kcal and 20 grams of protein.   Continue multivitamin with minerals daily.  NUTRITION DIAGNOSIS:   Increased nutrient needs related to wound healing as evidenced by estimated needs.  GOAL:   Patient will meet greater than or equal to 90% of their needs  MONITOR:   PO intake, Supplement acceptance, Labs, Weight trends, I & O's  REASON FOR ASSESSMENT:   Malnutrition Screening Tool    ASSESSMENT:   56 year old male with PMHx of DM type 2, HTN, HLD who presents after being unresponsive and unable to be woken up by family. Per chart patient drinks 6-7 beers per day.    Spoke with patient at bedside. He was eating his breakfast on assessment. Patient seemed to be alert and oriented, but question if patient is a good historian as he was unable to provide many details. Patient reports his appetite is "improving" but when asked when his appetite was poor, he reports it was not poor PTA. Patient reports he eats 2-3 meals per day. Reports a typical meal may be spaghetti. He reports he has been trying to lose some weight lately so he has been eating less than normal lately, but did not provide any further details.   Patient reports UBW 200 lbs and he has lost weight in the past 6 months. This would be a weight loss of 12.1 lbs (6.1% body weight) over 6 months, which is not significant for time frame.  Medications reviewed and include: folic acid 1 mg daily, MVI daily, pantoprazole, senna, thiamine 100 mg daily, vancomycin.  Labs reviewed: CBG 26-255 since admission yesterday.   Nutrition-Focused physical exam completed. Findings are no fat depletion, no muscle depletion, and no edema.   Discussed with RN.   Patient does not meet criteria for malnutrition at this time. Patient is at risk for malnutrition due to EtOH abuse - will continue to  monitor during admission.  Diet Order:  Diet heart healthy/carb modified Room service appropriate? Yes; Fluid consistency: Thin  Skin:  Wound (see comment) (diabetic ulcer right leg)  Last BM:  02/18/2017  Height:   Ht Readings from Last 1 Encounters:  02/18/17 5\' 8"  (1.727 m)    Weight:   Wt Readings from Last 1 Encounters:  02/18/17 187 lb 14.4 oz (85.2 kg)    Ideal Body Weight:  70 kg  BMI:  Body mass index is 28.57 kg/m.  Estimated Nutritional Needs:   Kcal:  2000-2340 (MSJ x 1.2-1.4)  Protein:  85-100 grams (1-1.2 grams/kg)  Fluid:  2-2.3 L/day  EDUCATION NEEDS:   Education needs no appropriate at this time  Helane RimaLeanne Shunsuke Granzow, MS, RD, LDN Pager: [phone] After Hours Pager: [phone]

## 2017-02-19 NOTE — Progress Notes (Signed)
Pharmacy Antibiotic Note  Isaac Morrison is a 56 y.o. male admitted on 02/18/2017 with wound infection.  Pharmacy has been consulted for vancomycin and cefepime dosing.  Plan: Continue cefepime 2 g IV q12h  Patient is currently receiving vancomycin 1000 mg IV q8h. Goal vancomycin trough 15-20 mcg/mL for wound Vancomycin trough scheduled for 3/8 @ 1730 which is prior to 5th dose and should represent steady state  Height: 5\' 8"  (172.7 cm) Weight: 187 lb 14.4 oz (85.2 kg) IBW/kg (Calculated) : 68.4  Temp (24hrs), Avg:98.2 F (36.8 C), Min:97.6 F (36.4 C), Max:98.7 F (37.1 C)   Recent Labs Lab 02/18/17 0600 02/18/17 0613 02/19/17 0435  WBC 9.8  --   --   CREATININE 0.44*  --  0.69  LATICACIDVEN  --  1.2  --     Estimated Creatinine Clearance: 112.1 mL/min (by C-G formula based on SCr of 0.69 mg/dL).    No Known Allergies  Antimicrobials this admission: cefepime 3/7 >>  vancomycin 3/7 >>   Dose adjustments this admission:  Microbiology results: 3/7 BCx: No growth < 24 hours  Thank you for allowing pharmacy to be a part of this patient's care.  Cindi CarbonMary M Leiann Sporer, PharmD, BCPS Clinical Pharmacist 02/19/2017 9:19 AM

## 2017-02-19 NOTE — Evaluation (Signed)
Physical Therapy Evaluation Patient Details Name: Isaac Morrison MRN: [id number] DOB: Nov 10, 1961 Today's Date: 02/19/2017   History of Present Illness  56 y/o male here with hypoglycemia.  He has been dealing with a R LE ulcer with wound care needs.   Clinical Impression  Pt did well with PT and despite being impulsive and eager to get out of the hospital he showed good safety, confidence and speed with ambulation around the nurses' station and negotiation of stairs.  He reports that his R LE limp has been his normal since age 5 and does not need (nor is he interested in) further PT intervention.  Pt safe to go home from PT stand point.     Follow Up Recommendations No PT follow up    Equipment Recommendations       Recommendations for Other Services       Precautions / Restrictions Restrictions Weight Bearing Restrictions: No      Mobility  Bed Mobility Overal bed mobility: Independent                Transfers Overall transfer level: Independent Equipment used: None             General transfer comment: Pt able to essentially jump out of bed and maintain balance, etc w/o issue  Ambulation/Gait Ambulation/Gait assistance: Independent Ambulation Distance (Feet): 250 Feet Assistive device: None       General Gait Details: Pt with baseline limp with R LE, but ultimately is safe, confidence and at his baseline regarding ambulation.   Stairs Stairs: Yes Stairs assistance: Independent Stair Management: One rail Right Number of Stairs: 8 General stair comments: Pt negotiates up/down steps w/o issue  Wheelchair Mobility    Modified Rankin (Stroke Patients Only)       Balance Overall balance assessment: No apparent balance deficits (not formally assessed)                                           Pertinent Vitals/Pain Pain Assessment: No/denies pain    Home Living Family/patient expects to be discharged to:: Private  residence Living Arrangements: Other relatives     Home Access: Stairs to enter Entrance Stairs-Rails: Right Entrance Stairs-Number of Steps: 6          Prior Function Level of Independence: Independent         Comments: Pt reports he regularly gets out of the house a few times a week     Hand Dominance        Extremity/Trunk Assessment   Upper Extremity Assessment Upper Extremity Assessment: Overall WFL for tasks assessed    Lower Extremity Assessment Lower Extremity Assessment: Overall WFL for tasks assessed;RLE deficits/detail (R LE atrophied, grossly weak but functional) RLE Deficits / Details: reports that he had had R LE weakness, etc since age 28       Communication   Communication: No difficulties  Cognition Arousal/Alertness: Awake/alert Behavior During Therapy: Impulsive Overall Cognitive Status: Within Functional Limits for tasks assessed                      General Comments      Exercises     Assessment/Plan    PT Assessment Patent does not need any further PT services  PT Problem List         PT Treatment Interventions  PT Goals (Current goals can be found in the Care Plan section)  Acute Rehab PT Goals Patient Stated Goal: get home right now PT Goal Formulation: All assessment and education complete, DC therapy    Frequency     Barriers to discharge        Co-evaluation               End of Session Equipment Utilized During Treatment: Gait belt Activity Tolerance: Patient tolerated treatment well Patient left: with bed alarm set;with call bell/phone within reach        Functional Assessment Tool Used: AM-PAC 6 Clicks Basic Mobility Functional Limitation: Mobility: Walking and moving around Mobility: Walking and Moving Around Current Status (G8978): 0 percent impaired, limited or restricted Mobility:[phone] Walking and Moving Around Goal Status [phone](G8979): 0 percent impaired, limited or restricted Mobility:  Walking and Moving Around Discharge Status [phone](G8980): 0 percent impaired, limited or restricted    Time: [id number] PT Time Calculation (min) (ACUTE ONLY): 14 min   Charges:   PT Evaluation $PT Eval Low Complexity: 1 Procedure     PT G Codes:   PT G-Codes **NOT FOR INPATIENT CLASS** Functional Assessment Tool Used: AM-PAC 6 Clicks Basic Mobility Functional Limitation: Mobility: Walking and moving around Mobility: Walking and Moving Around Current Status ([id number](G8978): 0 percent impaired, limited or restricted Mobility: Walking and Moving Around Goal Status ([id number](G8979): 0 percent impaired, limited or restricted Mobility: Walking and Moving Around Discharge Status ([id number](G8980): 0 percent impaired, limited or restricted     Malachi ProGalen R Ernie Sagrero, DPT 02/19/2017, 12:37 PM

## 2017-02-20 LAB — GLUCOSE, CAPILLARY: Glucose-Capillary: 92 mg/dL (ref 65–99)

## 2017-02-20 MED ORDER — ENSURE ENLIVE PO LIQD: 237.0000 mL | Freq: Two times a day (BID) | ORAL | 0 refills | Status: AC

## 2017-02-20 MED ORDER — THIAMINE HCL 100 MG PO TABS: 100.0000 mg | ORAL_TABLET | Freq: Every day | ORAL | 0 refills | Status: DC

## 2017-02-20 MED ORDER — DICLOXACILLIN SODIUM 500 MG PO CAPS: 500.0000 mg | ORAL_CAPSULE | Freq: Three times a day (TID) | ORAL | 0 refills | Status: DC

## 2017-02-20 MED ORDER — COLLAGENASE 250 UNIT/GM EX OINT: TOPICAL_OINTMENT | Freq: Every day | CUTANEOUS | 0 refills | Status: DC

## 2017-02-20 MED ORDER — MORPHINE SULFATE 30 MG PO TABS: 30.0000 mg | ORAL_TABLET | Freq: Two times a day (BID) | ORAL | 0 refills | Status: DC | PRN

## 2017-02-20 MED ORDER — INSULIN ASPART 100 UNIT/ML ~~LOC~~ SOLN: SUBCUTANEOUS | 0 refills | Status: DC

## 2017-02-20 NOTE — Care Management Important Message (Signed)
Important Message  Patient Details  Name: Rebeca AlertBilly Rosenberry MRN: [id number] Date of Birth: [birth year]   Medicare Important Message Given:  Yes    Gwenette GreetBrenda S Janeva Peaster, RN 02/20/2017, 8:39 AM

## 2017-02-20 NOTE — Progress Notes (Signed)
Attempted to redress pt wound this am but pt refused. Pt reported that staff did not use the correct medicine he told them so he would wait until he goes home to redress the wound. No signs of distress noted.

## 2017-02-20 NOTE — Discharge Summary (Signed)
[facility] - [place] at [facility]   PATIENT NAME: Isaac Morrison    MR#:  [id number]  DATE OF BIRTH:  06-Apr-1961  DATE OF ADMISSION:  02/18/2017 ADMITTING PHYSICIAN: Alford Highland, MD  DATE OF DISCHARGE: 02/20/2017  9:30 AM  PRIMARY CARE PHYSICIAN: Craig Staggers   ADMISSION DIAGNOSIS:  Hypoglycemia [E16.2] Hypothermia, initial encounter [T68.XXXA]  DISCHARGE DIAGNOSIS:  Active Problems:   Hypoglycemia   SECONDARY DIAGNOSIS:   Past Medical History:  Diagnosis Date  . Diabetes mellitus without complication (HCC)   . Glaucoma   . Hyperlipidemia   . Hypertension     HOSPITAL COURSE:   1. Hypoglycemia. This was persistent. He required D10 drip initially. Hemoglobin A1c 5.5. The patient does not need 66 units of 70/30 insulin twice a day.  I sent the patient home on 4 units of NovoLog prior to meals. If the patient goes back to drinking alcohol he may have higher sugars and needs some Lantus insulin. 2. Hypothermia this has resolved 3. Hypokalemia improved with replacement 4. Alcohol abuse. Patient must stop alcohol. No signs of withdrawal today. 5. Essential hypertension. Can go back on lisinopril as outpatient 6. Hyperlipidemia unspecified can restart cholesterol medication 7. Chronic pain. I recommended decreasing his pain medication to half dose. 8. Right lower extremity wound with some drainage. The patient was placed on IV vancomycin and cefepime initially. I changed the antibiotics over dicloxacillin upon discharge home. Santyl ointment and keep wound covered. Will need follow-up on wound culture results as outpatient  DISCHARGE CONDITIONS:   Satisfactory  CONSULTS OBTAINED:   none  DRUG ALLERGIES:  No Known Allergies  DISCHARGE MEDICATIONS:   Discharge Medication List as of 02/20/2017  9:08 AM    START taking these medications   Details  collagenase (SANTYL) ointment Apply topically daily., Starting Fri 02/20/2017, Print    dicloxacillin  (DYNAPEN) 500 MG capsule Take 1 capsule (500 mg total) by mouth every 8 (eight) hours., Starting Fri 02/20/2017, Print    feeding supplement, ENSURE ENLIVE, (ENSURE ENLIVE) LIQD Take 237 mLs by mouth 2 (two) times daily with a meal., Starting Fri 02/20/2017, Print    thiamine 100 MG tablet Take 1 tablet (100 mg total) by mouth daily., Starting Fri 02/20/2017, Print      CONTINUE these medications which have CHANGED   Details  insulin aspart (NOVOLOG) 100 UNIT/ML injection 4 units subcutaneous injection prior to meals. (food must be in front of you), Print    morphine (MSIR) 30 MG tablet Take 1 tablet (30 mg total) by mouth 2 (two) times daily as needed., Starting Fri 02/20/2017, No Print      CONTINUE these medications which have NOT CHANGED   Details  benazepril (LOTENSIN) 20 MG tablet Take 20 mg by mouth daily., Starting Fri 04/08/2013, Historical Med    dorzolamide-timolol (COSOPT) 22.3-6.8 MG/ML ophthalmic solution Place 1 drop into both eyes 2 (two) times daily., Starting Fri 01/16/2017, Historical Med    fenofibrate (TRICOR) 145 MG tablet Take 145 mg by mouth daily., Historical Med    Omega-3 Fatty Acids (FISH OIL) 1000 MG CAPS Take 2,000 mg by mouth 4 (four) times daily., Historical Med    omeprazole (PRILOSEC) 20 MG capsule Take 20 mg by mouth daily., Starting Fri 12/12/2016, Historical Med    pravastatin (PRAVACHOL) 80 MG tablet Take 80 mg by mouth at bedtime., Starting Thu 12/04/2016, Historical Med    senna-docusate (SENOKOT-S) 8.6-50 MG tablet Take 1 tablet by mouth daily., Starting Wed 10/15/2016, Until  Thu 10/15/2017, Historical Med    tadalafil (CIALIS) 20 MG tablet Take 20 mg by mouth daily as needed., Starting Thu 09/08/2013, Historical Med      STOP taking these medications     hydrochlorothiazide (HYDRODIURIL) 25 MG tablet      insulin NPH-regular Human (HUMULIN 70/30) (70-30) 100 UNIT/ML injection          DISCHARGE INSTRUCTIONS:   Follow-up with PMD one week  If  you experience worsening of your admission symptoms, develop shortness of breath, life threatening emergency, suicidal or homicidal thoughts you must seek medical attention immediately by calling 911 or calling your MD immediately  if symptoms less severe.  You Must read complete instructions/literature along with all the possible adverse reactions/side effects for all the Medicines you take and that have been prescribed to you. Take any new Medicines after you have completely understood and accept all the possible adverse reactions/side effects.   Please note  You were cared for by a hospitalist during your hospital stay. If you have any questions about your discharge medications or the care you received while you were in the hospital after you are discharged, you can call the unit and asked to speak with the hospitalist on call if the hospitalist that took care of you is not available. Once you are discharged, your primary care physician will handle any further medical issues. Please note that NO REFILLS for any discharge medications will be authorized once you are discharged, as it is imperative that you return to your primary care physician (or establish a relationship with a primary care physician if you do not have one) for your aftercare needs so that they can reassess your need for medications and monitor your lab values.    Today   CHIEF COMPLAINT:   Chief Complaint  Patient presents with  . Hypoglycemia    HISTORY OF PRESENT ILLNESS:  Isaac Morrison  is a 56 y.o. male brought in with altered mental status and found to have a low sugar   VITAL SIGNS:  Blood pressure (!) 147/80, pulse 83, temperature 98.2 F (36.8 C), temperature source Oral, resp. rate (!) 22, height 5\' 8"  (1.727 m), weight 85.2 kg (187 lb 14.4 oz), SpO2 97 %.    PHYSICAL EXAMINATION:  GENERAL:  56 y.o.-year-old patient lying in the bed with no acute distress.  EYES: Pupils equal, round, reactive to light and  accommodation. No scleral icterus. Extraocular muscles intact.  HEENT: Head atraumatic, normocephalic. Oropharynx and nasopharynx clear.  NECK:  Supple, no jugular venous distention. No thyroid enlargement, no tenderness.  LUNGS: Normal breath sounds bilaterally, no wheezing, rales,rhonchi or crepitation. No use of accessory muscles of respiration.  CARDIOVASCULAR: S1, S2 normal. No murmurs, rubs, or gallops.  ABDOMEN: Soft, non-tender, non-distended. Bowel sounds present. No organomegaly or mass.  EXTREMITIES: No pedal edema, cyanosis, or clubbing.  NEUROLOGIC: Cranial nerves II through XII are intact. Muscle strength 5/5 in all extremities. Sensation intact. Gait not checked.  PSYCHIATRIC: The patient is alert and oriented x 3.  SKIN: Large ulcer right leg with greenish discharge  DATA REVIEW:   CBC  Recent Labs Lab 02/18/17 0600  WBC 9.8  HGB 17.5  HCT 50.1  PLT 194    Chemistries   Recent Labs Lab 02/18/17 0600 02/19/17 0435  NA 138 135  K 2.9* 4.2  CL 100* 105  CO2 29 24  GLUCOSE 63* 188*  BUN 10 11  CREATININE 0.44* 0.69  CALCIUM 9.2 9.0  MG  --  2.2  AST 39  --   ALT 40  --   ALKPHOS 84  --   BILITOT 1.2  --     Cardiac Enzymes  Recent Labs Lab 02/18/17 0600  TROPONINI <0.03    Microbiology Results  Results for orders placed or performed during the hospital encounter of 02/18/17  CULTURE, BLOOD (ROUTINE X 2) w Reflex to ID Panel     Status: None (Preliminary result)   Collection Time: 02/18/17  6:13 AM  Result Value Ref Range Status   Specimen Description BLOOD L AC  Final   Special Requests BOTTLES DRAWN AEROBIC AND ANAEROBIC BCAV  Final   Culture NO GROWTH 2 DAYS  Final   Report Status PENDING  Incomplete  CULTURE, BLOOD (ROUTINE X 2) w Reflex to ID Panel     Status: None (Preliminary result)   Collection Time: 02/18/17  6:13 AM  Result Value Ref Range Status   Specimen Description BLOOD R AC  Final   Special Requests BOTTLES DRAWN AEROBIC  AND ANAEROBIC BCAV  Final   Culture NO GROWTH 2 DAYS  Final   Report Status PENDING  Incomplete  Aerobic/Anaerobic Culture (surgical/deep wound)     Status: None (Preliminary result)   Collection Time: 02/18/17 11:31 AM  Result Value Ref Range Status   Specimen Description LEG RIGHT LOWER LEG  Final   Special Requests NONE  Final   Gram Stain   Final    ABUNDANT WBC PRESENT,BOTH PMN AND MONONUCLEAR ABUNDANT GRAM VARIABLE ROD ABUNDANT GRAM POSITIVE COCCI IN PAIRS    Culture   Final    MODERATE STAPHYLOCOCCUS AUREUS SUSCEPTIBILITIES TO FOLLOW WITHIN MIXED CULTURE HOLDING FOR POSSIBLE ANAEROBE Performed at [facility], [address] [place], [place] [address]    Report Status PENDING  Incomplete      Management plans discussed with the patient, family and they are in agreement.  CODE STATUS:  Code Status History    Date Active Date Inactive Code Status Order ID Comments User Context   02/18/2017  7:54 AM 02/20/2017 12:46 PM Full Code [id number]  Alford Highlandichard Antoinetta Berrones, MD ED      TOTAL TIME TAKING CARE OF THIS PATIENT: 35 minutes.    Alford HighlandWIETING, Kasondra Junod M.D on 02/20/2017 at 3:40 PM  Between 7am to 6pm - Pager - [phone]  After 6pm go to [email or web address] - Social research officer, governmentpassword [email or web address] [email or web address]  [facility] Office  [phone]  CC: Primary care physician; Craig Staggerson Fleming

## 2017-02-20 NOTE — Progress Notes (Signed)
Pt being discharged home, discharge instructions reviewed with pt and niece, states understanding, pt with no complaints at discharge, refused wheelchiar, pt ambulated to exit

## 2017-02-20 NOTE — Discharge Instructions (Signed)
Hypoglycemia ° Hypoglycemia is when the sugar (glucose) level in the blood is too low. Symptoms of low blood sugar may include: °· Feeling: °¨ Hungry. °¨ Worried or nervous (anxious). °¨ Sweaty and clammy. °¨ Confused. °¨ Dizzy. °¨ Sleepy. °¨ Sick to your stomach (nauseous). °· Having: °¨ A fast heartbeat. °¨ A headache. °¨ A change in your vision. °¨ Jerky movements that you cannot control (seizure). °¨ Nightmares. °¨ Tingling or no feeling (numbness) around the mouth, lips, or tongue. °· Having trouble with: °¨ Talking. °¨ Paying attention (concentrating). °¨ Moving (coordination). °¨ Sleeping. °· Shaking. °· Passing out (fainting). °· Getting upset easily (irritability). °Low blood sugar can happen to people who have diabetes and people who do not have diabetes. Low blood sugar can happen quickly, and it can be an emergency. °Treating Low Blood Sugar  °Low blood sugar is often treated by eating or drinking something sugary right away. If you can think clearly and swallow safely, follow the 15:15 rule: °· Take 15 grams of a fast-acting carb (carbohydrate). Some fast-acting carbs are: °¨ 1 tube of glucose gel. °¨ 3 sugar tablets (glucose pills). °¨ 6-8 pieces of hard candy. °¨ 4 oz (120 mL) of fruit juice. °¨ 4 oz (120 mL) of regular (not diet) soda. °· Check your blood sugar 15 minutes after you take the carb. °· If your blood sugar is still at or below 70 mg/dL (3.9 mmol/L), take 15 grams of a carb again. °· If your blood sugar does not go above 70 mg/dL (3.9 mmol/L) after 3 tries, get help right away. °· After your blood sugar goes back to normal, eat a meal or a snack within 1 hour. °Treating Very Low Blood Sugar  °If your blood sugar is at or below 54 mg/dL (3 mmol/L), you have very low blood sugar (severe hypoglycemia). This is an emergency. Do not wait to see if the symptoms will go away. Get medical help right away. Call your local emergency services (911 in the [place] Do not drive yourself to the  hospital. °If you have very low blood sugar and you cannot eat or drink, you may need a glucagon shot (injection). A family member or friend should learn how to check your blood sugar and how to give you a glucagon shot. Ask your doctor if you need to have a glucagon shot kit at home. °Follow these instructions at home: °General instructions °· Avoid any diets that cause you to not eat enough food. Talk with your doctor before you start any new diet. °· Take over-the-counter and prescription medicines only as told by your doctor. °· Limit alcohol to no more than 1 drink per day for nonpregnant women and 2 drinks per day for men. One drink equals 12 oz of beer, 5 oz of wine, or 1½ oz of hard liquor. °· Keep all follow-up visits as told by your doctor. This is important. °If You Have Diabetes:  °· Make sure you know the symptoms of low blood sugar. °· Always keep a source of sugar with you, such as: °¨ Sugar. °¨ Sugar tablets. °¨ Glucose gel. °¨ Fruit juice. °¨ Regular soda (not diet soda). °¨ Milk. °¨ Hard candy. °¨ Honey. °· Take your medicines as told. °· Follow your exercise and meal plan. °¨ Eat on time. Do not skip meals. °¨ Follow your sick day plan when you cannot eat or drink normally. Make this plan ahead of time with your doctor. °· Check your blood sugar as   often as told by your doctor. Always check before and after exercise. °· Share your diabetes care plan with: °¨ Your work or school. °¨ People you live with. °· Check your pee (urine) for ketones: °¨ When you are sick. °¨ As told by your doctor. °· Carry a card or wear jewelry that says you have diabetes. °If You Have Low Blood Sugar From Other Causes:  °· Check your blood sugar as often as told by your doctor. °· Follow instructions from your doctor about what you cannot eat or drink. °Contact a doctor if: °· You have trouble keeping your blood sugar in your target range. °· You have low blood sugar often. °Get help right away if: °· You still have  symptoms after you eat or drink something sugary. °· Your blood sugar is at or below 54 mg/dL (3 mmol/L). °· You have jerky movements that you cannot control. °· You pass out. °These symptoms may be an emergency. Do not wait to see if the symptoms will go away. Get medical help right away. Call your local emergency services (911 in the [place] Do not drive yourself to the hospital.  °This information is not intended to replace advice given to you by your health care provider. Make sure you discuss any questions you have with your health care provider. °Document Released: 02/25/2010 Document Revised: 05/08/2016 Document Reviewed: 01/04/2016 °Elsevier Interactive Patient Education © 2017 [employer] ° °

## 2017-02-23 LAB — AEROBIC/ANAEROBIC CULTURE W GRAM STAIN (SURGICAL/DEEP WOUND)

## 2017-02-23 LAB — AEROBIC/ANAEROBIC CULTURE (SURGICAL/DEEP WOUND)

## 2017-02-23 LAB — CULTURE, BLOOD (ROUTINE X 2)
CULTURE: NO GROWTH
Culture: NO GROWTH

## 2019-05-24 ENCOUNTER — Other Ambulatory Visit: Payer: Self-pay

## 2019-05-24 ENCOUNTER — Encounter: Payer: Self-pay | Admitting: Physical Therapy

## 2019-05-24 ENCOUNTER — Ambulatory Visit: Payer: Medicare Other | Attending: Physical Medicine & Rehabilitation | Admitting: Physical Therapy

## 2019-05-24 DIAGNOSIS — M25562 Pain in left knee: Secondary | ICD-10-CM | POA: Diagnosis present

## 2019-05-24 DIAGNOSIS — G8929 Other chronic pain: Secondary | ICD-10-CM | POA: Diagnosis present

## 2019-05-24 DIAGNOSIS — R2689 Other abnormalities of gait and mobility: Secondary | ICD-10-CM | POA: Diagnosis not present

## 2019-05-24 DIAGNOSIS — M545 Low back pain, unspecified: Secondary | ICD-10-CM

## 2019-05-24 DIAGNOSIS — R293 Abnormal posture: Secondary | ICD-10-CM

## 2019-05-24 DIAGNOSIS — M25651 Stiffness of right hip, not elsewhere classified: Secondary | ICD-10-CM

## 2019-05-24 DIAGNOSIS — R2681 Unsteadiness on feet: Secondary | ICD-10-CM | POA: Diagnosis present

## 2019-05-24 DIAGNOSIS — M6281 Muscle weakness (generalized): Secondary | ICD-10-CM | POA: Diagnosis present

## 2019-05-24 DIAGNOSIS — Z9181 History of falling: Secondary | ICD-10-CM | POA: Diagnosis present

## 2019-05-25 NOTE — Therapy (Signed)
[facility] [address] [place], [place], [address] Phone: [phone]   Fax:  [phone]  Physical Therapy Evaluation  Patient Details  Name: Isaac Morrison MRN: [id number] Date of Birth: 11-Jan-1961 Referring Provider (PT): Nelida MeuseBryan Kozak, [place]   Encounter Date: 05/24/2019   CLINIC OPERATION CHANGES: Outpatient Neuro Rehab is open at lower capacity following universal masking, social distancing, and patient screening.  The patient's COVID risk of complications score is 1.  PT End of Session - 05/25/19 0943    Visit Number  1    Number of Visits  25    Date for PT Re-Evaluation  08/19/19    Authorization Type  [employer] Medicare    Authorization Time Period  Copays, oop, deductables & coinsurance has been waived 04/25/2019 - 09/14/2019    PT Start Time  1355    PT Stop Time  1439    PT Time Calculation (min)  44 min    Equipment Utilized During Treatment  Gait belt    Activity Tolerance  Patient tolerated treatment well    Behavior During Therapy  WFL for tasks assessed/performed       Past Medical History:  Diagnosis Date  . Diabetes mellitus without complication (HCC)   . Glaucoma   . Hyperlipidemia   . Hypertension     Past Surgical History:  Procedure Laterality Date  . REPLACEMENT TOTAL KNEE BILATERAL Bilateral   . Skin grafts    . TOTAL SHOULDER REPLACEMENT Right     There were no vitals filed for this visit.   Subjective Assessment - 05/24/19 1359    Subjective  This 57yo was refered to PT for Microprocessor Knee Prosthesis on 05/05/2019 from [facility] [place]. 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 He had bone cancer at 58yo and he had recurrent wounds on right leg. In 2019 a wound that would not heal partly due to radation resulted in need for amputation. He received prosthesis on 04/21/2019.      Pertinent History  TFA, HTN, Glaucoma, DM, alcohol abuse,     Limitations   Lifting;Standing;Walking;House hold activities    Patient Stated Goals  To use prosthesis to walk better in community. Get on exercise bike.     Currently in Pain?  Yes    Pain Score  5    in last week, worst 8/10 & best 5/10   Pain Location  Back    Pain Orientation  Left;Lower    Pain Descriptors / Indicators  Aching    Pain Type  Chronic pain    Pain Onset  More than a month ago    Pain Frequency  Constant    Aggravating Factors   sitting too long,     Pain Relieving Factors  pain meds.     Multiple Pain Sites  Yes    Pain Score  4   in last week, worst 7/10 & best 3/10   Pain Location  Knee    Pain Orientation  Left    Pain Descriptors / Indicators  Aching    Pain Type  Chronic pain;Other (Comment)   arthritis   Pain Onset  More than a month ago    Pain Frequency  Constant    Aggravating Factors   standing or walking too long, hopping with prosthesis    Pain Relieving Factors  pain meds         [facility] PT Assessment - 05/24/19 1400      Assessment  Medical Diagnosis  Left Transfemoral Amputation    Referring Provider (PT)  Nelida MeuseBryan Kozak, [place]    Onset Date/Surgical Date  04/21/19   Prosthesis delivery   Hand Dominance  Right    Prior Therapy  none      Precautions   Precautions  Fall      Balance Screen   Has the patient fallen in the past 6 months  Yes    How many times?  ~10 times since amputation with 1 time since prosthesis    Has the patient had a decrease in activity level because of a fear of falling?   No    Is the patient reluctant to leave their home because of a fear of falling?   No      Home Environment   Living Environment  Private residence    Living Arrangements  Other relatives   brother   Type of Home  Mobile home    Home Access  Stairs to enter    Entrance Stairs-Number of Steps  4    Entrance Stairs-Rails  None    Home Layout  One level    Home Equipment  Crutches;Walker - 2 wheels;Cane - single point      Prior Function   Level of  Independence  Independent;Independent with household mobility without device;Independent with community mobility without device    Vocation  On disability    Leisure  fishing, bowling, hunting      Posture/Postural Control   Posture/Postural Control  Postural limitations    Postural Limitations  Rounded Shoulders;Forward head;Flexed trunk;Weight shift left      ROM / Strength   AROM / PROM / Strength  AROM;Strength      AROM   Overall AROM   Deficits    Overall AROM Comments  Right hip extension ~-10* standing,  left knee extension -15*      Strength   Overall Strength  Deficits    Strength Assessment Site  Hip;Knee;Ankle    Right Hip Flexion  4/5    Right Hip Extension  3-/5    Right Hip ABduction  3+/5    Left Hip Flexion  5/5    Right/Left Knee  Left    Left Knee Flexion  4/5    Left Knee Extension  4/5    Left Ankle Dorsiflexion  4/5      Transfers   Transfers  Sit to Stand;Stand to Sit    Sit to Stand  5: Supervision;With upper extremity assist;With armrests;From chair/3-in-1   needs to touch crutch or back of legs against chair   Sit to Stand Details (indicate cue type and reason)  Minimal prosthesis engagement    Stand to Sit  5: Supervision;With upper extremity assist;With armrests;To chair/3-in-1   uses back of legs against chair   Stand to Sit Details  Minimal prosthesis engagement      Ambulation/Gait   Ambulation/Gait  Yes    Ambulation/Gait Assistance  4: Min assist;2: Max assist;5: Supervision   MaxA for balance loss, MinA with scanning,    Ambulation/Gait Assistance Details  Minimal to no prosthetic knee flexion in terminal stance / swing,  excessive UE weight bearing on crutch & partial weight on prosthesis    Ambulation Distance (Feet)  200 Feet    Assistive device  L Forearm Crutch;Prosthesis    Gait Pattern  Step-through pattern;Decreased arm swing - right;Decreased step length - left;Decreased stance time - right;Decreased stride length;Decreased  hip/knee flexion - right;Decreased weight  shift to right;Right circumduction;Right hip hike;Right flexed knee in stance;Antalgic;Lateral hip instability;Trunk flexed;Abducted- right;Poor foot clearance - right    Ambulation Surface  Indoor;Level    Gait velocity  1.71 ft/sec    Stairs  Yes    Stairs Assistance  5: Supervision    Stair Management Technique  One rail Left;With cane;Step to pattern;Forwards   modified technique with no prostheis engagement descending   Number of Stairs  4      Standardized Balance Assessment   Standardized Balance Assessment  Berg Balance Test;Dynamic Gait Index      Berg Balance Test   Sit to Stand  Needs minimal aid to stand or to stabilize    Standing Unsupported  Able to stand 2 minutes with supervision    Sitting with Back Unsupported but Feet Supported on Floor or Stool  Able to sit safely and securely 2 minutes    Stand to Sit  Controls descent by using hands    Transfers  Able to transfer safely, definite need of hands    Standing Unsupported with Eyes Closed  Unable to keep eyes closed 3 seconds but stays steady    Standing Unsupported with Feet Together  Needs help to attain position but able to stand for 30 seconds with feet together    From Standing, Reach Forward with Outstretched Arm  Reaches forward but needs supervision    From Standing Position, Pick up Object from Floor  Able to pick up shoe, needs supervision    From Standing Position, Turn to Look Behind Over each Shoulder  Needs supervision when turning    Turn 360 Degrees  Needs assistance while turning    Standing Unsupported, Alternately Place Feet on Step/Stool  Needs assistance to keep from falling or unable to try    Standing Unsupported, One Foot in Front  Needs help to step but can hold 15 seconds    Standing on One Leg  Tries to lift leg/unable to hold 3 seconds but remains standing independently    Total Score  23      Dynamic Gait Index   Level Surface  Moderate Impairment     Change in Gait Speed  Severe Impairment    Gait with Horizontal Head Turns  Severe Impairment    Gait with Vertical Head Turns  Severe Impairment    Gait and Pivot Turn  Severe Impairment    Step Over Obstacle  Severe Impairment    Step Around Obstacles  Severe Impairment    Steps  Moderate Impairment    Total Score  2      Functional Gait  Assessment   Gait assessed   Yes    Gait Level Surface  Cannot walk 20 ft without assistance, severe gait deviations or imbalance deviates greater than 15 in outside of the 12 in walkway width or reaches and touches the wall    Change in Gait Speed  Cannot change speeds, deviates greater than 15 in outside 12 in walkway width, or loses balance and has to reach for wall or be caught.    Gait with Horizontal Head Turns  Performs head turns with moderate changes in gait velocity, slows down, deviates 10-15 in outside 12 in walkway width but recovers, can continue to walk.    Gait with Vertical Head Turns  Performs task with severe disruption of gait (eg, staggers 15 in outside 12 in walkway width, loses balance, stops, reaches for wall).    Gait and Pivot Turn  Cannot turn safely, requires assistance to turn and stop.    Step Over Obstacle  Cannot perform without assistance.    Gait with Narrow Base of Support  Ambulates less than 4 steps heel to toe or cannot perform without assistance.    Gait with Eyes Closed  Cannot walk 20 ft without assistance, severe gait deviations or imbalance, deviates greater than 15 in outside 12 in walkway width or will not attempt task.    Ambulating Backwards  Cannot walk 20 ft without assistance, severe gait deviations or imbalance, deviates greater than 15 in outside 12 in walkway width or will not attempt task.    Steps  Two feet to a stair, must use rail.    Total Score  1      Prosthetics Assessment - 05/24/19 1400      Prosthetics   Prosthetic Care Dependent with  Skin check;Residual limb care;Care of non-amputated  limb;Prosthetic cleaning;Ply sock cleaning;Correct ply sock adjustment;Proper wear schedule/adjustment;Proper weight-bearing schedule/adjustment    Donning prosthesis   Supervision    Doffing prosthesis   Supervision    Current prosthetic wear tolerance (days/week)   daily    Current prosthetic wear tolerance (#hours/day)   4-6 hours    Current prosthetic weight-bearing tolerance (hours/day)   Patient tolerated standing 5 minutes with partial weight on prosthesis with no c/o limb discomfort. He has LBP & left knee pain that increases with overactivity.     Edema  none    Residual limb condition   No open areas, red spots from "heat rash", >3" distal redunant tissue without bone, conical/cylinderical shape,  hair growth, normal temperature    K code/activity level with prosthetic use   K3 potential for full community with variable cadence,  Transfemoral Microprocessor Prosthesis: Orion3 knee, ischial containment socket, silicon liner with shuttle pin lock               Objective measurements completed on examination: See above findings.      [facility] Adult PT Treatment/Exercise - 05/24/19 1400      Prosthetics   Prosthetic Care Comments   use of antiperspirant on limb at night for sweat managment.  Wear 3-4 hrs 2x/day.     Education Provided  Skin check;Residual limb care;Prosthetic cleaning;Correct ply sock adjustment;Proper Donning;Proper wear schedule/adjustment;Other (comment)   see prosthetic care comments   Person(s) Educated  Patient    Education Method  Explanation;Demonstration;Tactile cues;Verbal cues    Education Method  Verbalized understanding;Verbal cues required;Needs further instruction               PT Short Term Goals - 05/25/19 1207      PT SHORT TERM GOAL #1   Title  Patient demonstrates proper donning & verbalizes proper cleaning of prosthesis. (All STGs Target Date: 07/01/2019)    Time  1    Period  Months    Status  New    Target Date  07/01/19       PT SHORT TERM GOAL #2   Title  Patient tolerates prosthesis wear >8 hrs total/day without skin or limb pain issues.     Time  1    Period  Months    Status  New    Target Date  07/01/19      PT SHORT TERM GOAL #3   Title  Patient reaches 7" anteriorly & to floor without loss of balance without UE support with supervision.     Time  1    Period  Months    Status  New    Target Date  07/01/19      PT SHORT TERM GOAL #4   Title  Patient ambulates 400' with bilateral forearm crutches & prosthesis engaging prosthetic knee >50% of steps with supervision.     Time  1    Period  Months    Status  New    Target Date  07/01/19      PT SHORT TERM GOAL #5   Title  Patient negotiates ramps, curbs with 2 crutches & stairs with 1 rail/1 crutch step-to with prosthesis with supervision.     Time  1    Period  Months    Status  New    Target Date  07/01/19      PT SHORT TERM GOAL #6   Title  Patient descends stairs with 2 rails engaging prosthesis with step-to pattern.     Time  1    Period  Months    Status  New    Target Date  07/01/19        PT Long Term Goals - 05/25/19 1200      PT LONG TERM GOAL #1   Title  Patient verbalizes & demonstrates proper prosthetic care to enable safe use of prosthesis.  (All LTGs Target Date: 08/19/2019)    Time  3    Period  Months    Status  New    Target Date  08/19/19      PT LONG TERM GOAL #2   Title  Patient tolerates prosthesis wear >90% of awake hours without skin or limb pain issues to enable function throughout his day.     Time  3    Period  Months    Status  New    Target Date  08/19/19      PT LONG TERM GOAL #3   Title  Patient reports chronic low back & left knee pain increases <2 increments on 0-10 scale with standing & gait activities.     Time  3    Period  Months    Status  New    Target Date  08/19/19      PT LONG TERM GOAL #4   Title  Berg Balance >45/56 to indicate lower fall risk.      Time  3    Period  Months     Status  New    Target Date  08/19/19      PT LONG TERM GOAL #5   Title  Functional Gait Assessment with prosthesis only >/= 15/30 to indicate lower fall risk.     Time  3    Period  Months    Status  New    Target Date  08/19/19      PT LONG TERM GOAL #6   Title  Patient ambulates 1000' outdoors including grass with cane or less and prosthesis modified independent to enable community mobility.     Time  3    Period  Months    Status  New    Target Date  08/19/19      PT LONG TERM GOAL #7   Title  Patient negotiates ramps, curbs and stairs with single rail and cane or less with prosthesis modified independent for community access.     Time  3    Period  Months    Status  New    Target Date  08/19/19  Plan - 05/25/19 0949    Clinical Impression Statement  Patient underwent a right Transfemoral Amputation on 01/05/2019 due to nonhealing recurring wound and he recieved his first prosthesis on 04/21/2019.  Patient is dependent in use, wear & care of his new prosthesis which is microprocessor.  He wears prosthesis daily for 4-6 hours but has small rash that appears to be heat rash from nonbreathing liner.  Berg Balance 23/56 indicates high fall risk and dependency in ADLs. He has prosthetic gait with significant deviations including catching foot with MaxA to prevent a fall. Gait Velocity of 1.71 ft/sec, Dynamic Gait Index 2/24 and Functional Gait Assessment 1/30 indicate high fall risk and gait dependency.  Patient would benefit from skilled PT to improve mobility & safety.     Personal Factors and Comorbidities  Comorbidity 3+;Fitness;Past/Current Experience;Time since onset of injury/illness/exacerbation    Comorbidities  TFA, HTN, Glaucoma, DM, alcohol abuse, left knee replacement, bone CA right lower leg 58yo, 2007 right rotator cuff tear. OA    Examination-Activity Limitations  Bend;Carry;Lift;Locomotion Level;Squat;Stairs;Stand;Transfers    Examination-Participation  Restrictions  Community Activity;Yard Work    Conservation officer, historic buildings  Evolving/Moderate complexity    Clinical Decision Making  Moderate    Rehab Potential  Good    PT Frequency  2x / week    PT Duration  12 weeks    PT Treatment/Interventions  ADLs/Self Care Home Management;DME Instruction;Gait training;Stair training;Functional mobility training;Therapeutic activities;Therapeutic exercise;Balance training;Neuromuscular re-education;Patient/family education;Prosthetic Training;Vestibular    PT Next Visit Plan  review prosthetic care, HEP at sink, prosthetic gait with bilateral forearm crutches    Consulted and Agree with Plan of Care  Patient       Patient will benefit from skilled therapeutic intervention in order to improve the following deficits and impairments:  Abnormal gait, Decreased activity tolerance, Decreased balance, Decreased endurance, Decreased knowledge of use of DME, Decreased mobility, Decreased range of motion, Decreased strength, Dizziness, Impaired flexibility, Postural dysfunction, Prosthetic Dependency, Pain  Visit Diagnosis: Other abnormalities of gait and mobility  Unsteadiness on feet  Abnormal posture  Chronic left-sided low back pain without sciatica  Chronic pain of left knee  Stiffness of right hip, not elsewhere classified  History of falling  Muscle weakness (generalized)     Problem List Patient Active Problem List   Diagnosis Date Noted  . Hypoglycemia 02/18/2017    Farren Nelles PT, DPT 05/25/2019, 12:18 PM  [facility] [address] [place], [place], [address] Phone: [phone]   Fax:  [phone]  Name: Isaac Morrison MRN: [id number] Date of Birth: December 06, 1961

## 2019-06-07 ENCOUNTER — Encounter: Payer: Self-pay | Admitting: Physical Therapy

## 2019-06-07 ENCOUNTER — Other Ambulatory Visit: Payer: Self-pay

## 2019-06-07 ENCOUNTER — Ambulatory Visit: Payer: Medicare Other | Admitting: Physical Therapy

## 2019-06-07 DIAGNOSIS — R2689 Other abnormalities of gait and mobility: Secondary | ICD-10-CM

## 2019-06-07 DIAGNOSIS — R293 Abnormal posture: Secondary | ICD-10-CM

## 2019-06-07 DIAGNOSIS — R2681 Unsteadiness on feet: Secondary | ICD-10-CM

## 2019-06-07 NOTE — Patient Instructions (Signed)

## 2019-06-07 NOTE — Therapy (Signed)
[facility] [address] [place], [place], [address] Phone: [phone]   Fax:  [phone]  Physical Therapy Treatment  Patient Details  Name: Isaac Morrison MRN: [id number] Date of Birth: August 01, 1961 Referring Provider (PT): Nelida MeuseBryan Kozak, [place]   Encounter Date: 06/07/2019  PT End of Session - 06/07/19 [id number]    Visit Number  2    Number of Visits  25    Date for PT Re-Evaluation  08/19/19    Authorization Type  UHC Medicare    Authorization Time Period  Copays, oop, deductables & coinsurance has been waived 04/25/2019 - 09/14/2019    PT Start Time  0900    PT Stop Time  0947    PT Time Calculation (min)  47 min    Equipment Utilized During Treatment  Gait belt    Activity Tolerance  Patient tolerated treatment well    Behavior During Therapy  [facility] for tasks assessed/performed       Past Medical History:  Diagnosis Date  . Diabetes mellitus without complication (HCC)   . Glaucoma   . Hyperlipidemia   . Hypertension     Past Surgical History:  Procedure Laterality Date  . REPLACEMENT TOTAL KNEE BILATERAL Bilateral   . Skin grafts    . TOTAL SHOULDER REPLACEMENT Right     There were no vitals filed for this visit.  Subjective Assessment - 06/07/19 0901    Subjective  No new complaints. No falls to report.    Pertinent History  TFA, HTN, Glaucoma, DM, alcohol abuse,     Limitations  Lifting;Standing;Walking;House hold activities    Patient Stated Goals  To use prosthesis to walk better in community. Get on exercise bike.     Currently in Pain?  Yes    Pain Score  5     Pain Location  Back    Pain Orientation  Left;Lower    Pain Descriptors / Indicators  Aching;Sore    Pain Type  Chronic pain    Pain Onset  More than a month ago    Pain Frequency  Constant    Aggravating Factors   sitting too long    Pain Relieving Factors  pain medication    Pain Score  5    Pain Location  Knee    Pain Orientation  Left     Pain Descriptors / Indicators  Aching    Pain Type  Chronic pain   OA   Pain Onset  More than a month ago    Aggravating Factors   standing or walking too long, hopping before getting prothesis    Pain Relieving Factors  pain meds           [facility] Adult PT Treatment/Exercise - 06/07/19 0904      Transfers   Transfers  Sit to Stand;Stand to Sit    Sit to Stand  5: Supervision;With upper extremity assist;With armrests;From chair/3-in-1    Stand to Sit  5: Supervision;With upper extremity assist;With armrests;To chair/3-in-1      Ambulation/Gait   Ambulation/Gait  Yes    Ambulation/Gait Assistance  4: Min guard;5: Supervision    Ambulation/Gait Assistance Details  pt to clinci with single forearm crutch, used this with barriers/sink HEP at supervision level. added second crutch for gait training on track with empahsis placed on prosthetic knee flexion with swing phase and engagement of hydraulics of prosthsis, min guard to min assist for balance.      Ambulation Distance (Feet)  345 Feet   x1 bil forearm crutches; around gym with single crutch   Assistive device  L Forearm Crutch;Prosthesis;Lofstrands    Gait Pattern  Step-through pattern;Decreased arm swing - right;Decreased step length - left;Decreased stance time - right;Decreased stride length;Decreased hip/knee flexion - right;Decreased weight shift to right;Right circumduction;Right hip hike;Right flexed knee in stance;Antalgic;Lateral hip instability;Trunk flexed;Abducted- right;Poor foot clearance - right    Ambulation Surface  Level;Indoor    Stairs  Yes    Stairs Assistance  5: Supervision;4: Min guard;4: Min assist    Stairs Assistance Details (indicate cue type and reason)  1 reps with left rail/crutch with supervision, remainder cues on weight shifting; block practice of using prosthesis and riding hydraulics down to lower left LE with bil UE support, min assist downgrading to min guard assist for balance.     Stair  Management Technique  One rail Left;Two rails;Step to pattern;Forwards;With crutches    Number of Stairs  4   bloc practice   Pre-Gait Activities  in parallel bars working on engaging prosthetic hydrualics- with 4 inch step in center and declines on both sides, bloc practice of stepping on step with left foot and riding prosthesis down. cues/faciliation needed on technique, weight shifting and pelvic movements to assist with this process.       Neuro Re-ed    Neuro Re-ed Details   pt educated on and issued sink HEP for balance and proprioception.       Prosthetics   Prosthetic Care Comments   using antiperspirant for sweat management.     Current prosthetic wear tolerance (days/week)   daily    Current prosthetic wear tolerance (#hours/day)   10-12 hours, drying as needed    Residual limb condition   reports no issues. states the heat rash is gone "i didnt' even see it the last time"    Education Provided  Skin check;Residual limb care;Correct ply sock adjustment;Proper Donning;Proper wear schedule/adjustment    Person(s) Educated  Patient    Education Method  Explanation;Demonstration;Verbal cues    Education Method  Verbalized understanding;Returned demonstration;Verbal cues required;Needs further instruction          PT Education - 06/07/19 0913    Education Details  sink HEP for balance and proprioception    Person(s) Educated  Patient    Methods  Explanation;Demonstration;Verbal cues;Handout    Comprehension  Verbalized understanding;Returned demonstration       PT Short Term Goals - 05/25/19 1207      PT SHORT TERM GOAL #1   Title  Patient demonstrates proper donning & verbalizes proper cleaning of prosthesis. (All STGs Target Date: 07/01/2019)    Time  1    Period  Months    Status  New    Target Date  07/01/19      PT SHORT TERM GOAL #2   Title  Patient tolerates prosthesis wear >8 hrs total/day without skin or limb pain issues.     Time  1    Period  Months     Status  New    Target Date  07/01/19      PT SHORT TERM GOAL #3   Title  Patient reaches 7" anteriorly & to floor without loss of balance without UE support with supervision.     Time  1    Period  Months    Status  New    Target Date  07/01/19      PT SHORT TERM GOAL #4   Title  Patient ambulates 400' with bilateral forearm crutches & prosthesis engaging prosthetic knee >50% of steps with supervision.     Time  1    Period  Months    Status  New    Target Date  07/01/19      PT SHORT TERM GOAL #5   Title  Patient negotiates ramps, curbs with 2 crutches & stairs with 1 rail/1 crutch step-to with prosthesis with supervision.     Time  1    Period  Months    Status  New    Target Date  07/01/19      PT SHORT TERM GOAL #6   Title  Patient descends stairs with 2 rails engaging prosthesis with step-to pattern.     Time  1    Period  Months    Status  New    Target Date  07/01/19        PT Long Term Goals - 05/25/19 1200      PT LONG TERM GOAL #1   Title  Patient verbalizes & demonstrates proper prosthetic care to enable safe use of prosthesis.  (All LTGs Target Date: 08/19/2019)    Time  3    Period  Months    Status  New    Target Date  08/19/19      PT LONG TERM GOAL #2   Title  Patient tolerates prosthesis wear >90% of awake hours without skin or limb pain issues to enable function throughout his day.     Time  3    Period  Months    Status  New    Target Date  08/19/19      PT LONG TERM GOAL #3   Title  Patient reports chronic low back & left knee pain increases <2 increments on 0-10 scale with standing & gait activities.     Time  3    Period  Months    Status  New    Target Date  08/19/19      PT LONG TERM GOAL #4   Title  Berg Balance >45/56 to indicate lower fall risk.      Time  3    Period  Months    Status  New    Target Date  08/19/19      PT LONG TERM GOAL #5   Title  Functional Gait Assessment with prosthesis only >/= 15/30 to indicate lower  fall risk.     Time  3    Period  Months    Status  New    Target Date  08/19/19      PT LONG TERM GOAL #6   Title  Patient ambulates 1000' outdoors including grass with cane or less and prosthesis modified independent to enable community mobility.     Time  3    Period  Months    Status  New    Target Date  08/19/19      PT LONG TERM GOAL #7   Title  Patient negotiates ramps, curbs and stairs with single rail and cane or less with prosthesis modified independent for community access.     Time  3    Period  Months    Status  New    Target Date  08/19/19            Plan - 06/07/19 0904    Clinical Impression Statement  Today's skilled session initially focused on education on and issuing of the sink  HEP for balance/propriception. Remainder of session focused on gait and barriers with emphasis on engagement of hydraulics of prosthesis. Pt will need further work on this.    Personal Factors and Comorbidities  Comorbidity 3+;Fitness;Past/Current Experience;Time since onset of injury/illness/exacerbation    Comorbidities  TFA, HTN, Glaucoma, DM, alcohol abuse, left knee replacement, bone CA right lower leg 58yo, 2007 right rotator cuff tear. OA    Examination-Activity Limitations  Bend;Carry;Lift;Locomotion Level;Squat;Stairs;Stand;Transfers    Examination-Participation Restrictions  Community Activity;Yard Work    Conservation officer, historic buildingstability/Clinical Decision Making  Evolving/Moderate complexity    Rehab Potential  Good    PT Frequency  2x / week    PT Duration  12 weeks    PT Treatment/Interventions  ADLs/Self Care Home Management;DME Instruction;Gait training;Stair training;Functional mobility training;Therapeutic activities;Therapeutic exercise;Balance training;Neuromuscular re-education;Patient/family education;Prosthetic Training;Vestibular    PT Next Visit Plan  prosthetic gait with bilateral forearm crutches woirking on increased prosthetic knee flexion, standing balance activities, stairs  using prosthesis to lower pt down    Consulted and Agree with Plan of Care  Patient       Patient will benefit from skilled therapeutic intervention in order to improve the following deficits and impairments:  Abnormal gait, Decreased activity tolerance, Decreased balance, Decreased endurance, Decreased knowledge of use of DME, Decreased mobility, Decreased range of motion, Decreased strength, Dizziness, Impaired flexibility, Postural dysfunction, Prosthetic Dependency, Pain  Visit Diagnosis: 1. Other abnormalities of gait and mobility   2. Unsteadiness on feet   3. Abnormal posture        Problem List Patient Active Problem List   Diagnosis Date Noted  . Hypoglycemia 02/18/2017    Sallyanne KusterKathy Clarkson Rosselli, PTA, [facility] [address], [address] [place], [place] [address] [phone] 06/07/19, 3:01 PM   Name: Isaac Morrison MRN: [id number] Date of Birth: 1961/10/25

## 2019-06-09 ENCOUNTER — Encounter: Payer: Self-pay | Admitting: Physical Therapy

## 2019-06-09 ENCOUNTER — Ambulatory Visit: Payer: Medicare Other | Admitting: Physical Therapy

## 2019-06-09 ENCOUNTER — Other Ambulatory Visit: Payer: Self-pay

## 2019-06-09 DIAGNOSIS — R2681 Unsteadiness on feet: Secondary | ICD-10-CM

## 2019-06-09 DIAGNOSIS — R2689 Other abnormalities of gait and mobility: Secondary | ICD-10-CM

## 2019-06-09 DIAGNOSIS — R293 Abnormal posture: Secondary | ICD-10-CM

## 2019-06-10 NOTE — Therapy (Signed)
[facility] [address] [place], [place], [address] Phone: [phone]   Fax:  [phone]  Physical Therapy Treatment  Patient Details  Name: Isaac Morrison MRN: [id number] Date of Birth: January 23, 1961 Referring Provider (PT): Nelida MeuseBryan Kozak, [place]   Encounter Date: 06/09/2019  PT End of Session - 06/09/19 0905    Visit Number  3    Number of Visits  25    Date for PT Re-Evaluation  08/19/19    Authorization Type  UHC Medicare    Authorization Time Period  Copays, oop, deductables & coinsurance has been waived 04/25/2019 - 09/14/2019    PT Start Time  0902    PT Stop Time  0948    PT Time Calculation (min)  46 min    Equipment Utilized During Treatment  Gait belt    Activity Tolerance  Patient tolerated treatment well    Behavior During Therapy  [facility] for tasks assessed/performed       Past Medical History:  Diagnosis Date  . Diabetes mellitus without complication (HCC)   . Glaucoma   . Hyperlipidemia   . Hypertension     Past Surgical History:  Procedure Laterality Date  . REPLACEMENT TOTAL KNEE BILATERAL Bilateral   . Skin grafts    . TOTAL SHOULDER REPLACEMENT Right     There were no vitals filed for this visit.  Subjective Assessment - 06/09/19 0903    Subjective  No new complaints. No falls to report. Was planning to bring his other forearm crutch, however it broke last night.    Pertinent History  TFA, HTN, Glaucoma, DM, alcohol abuse,     Limitations  Lifting;Standing;Walking;House hold activities    Patient Stated Goals  To use prosthesis to walk better in community. Get on exercise bike.     Currently in Pain?  No/denies    Pain Score  7     Pain Location  Back    Pain Orientation  Left;Lower    Pain Descriptors / Indicators  Aching;Sore    Pain Type  Chronic pain    Pain Onset  More than a month ago    Pain Frequency  Constant    Aggravating Factors   sitting too long    Pain Relieving Factors   pain medication    Pain Score  5    Pain Location  Knee    Pain Orientation  Left    Pain Descriptors / Indicators  Aching    Pain Type  Chronic pain    Pain Onset  More than a month ago    Pain Frequency  Constant    Aggravating Factors   standing or walking too long, hopping on one leg prior ot getting prosthesis    Pain Relieving Factors  pain meds            [facility] Adult PT Treatment/Exercise - 06/09/19 0906      Transfers   Transfers  Sit to Stand;Stand to Sit    Sit to Stand  5: Supervision;With upper extremity assist;With armrests;From chair/3-in-1    Stand to Sit  5: Supervision;With upper extremity assist;With armrests;To chair/3-in-1    Number of Reps  1 set;Other reps (comment)   5 reps   Comments  working on equal LE weight bearing and engagement of prosthetic knee hydraulics with transfers. increased difficulty getting knee to flex, then with quick flexion when it did start to bend despite having weight on limb/prosthesis.  Ambulation/Gait   Ambulation/Gait  Yes    Ambulation/Gait Assistance  4: Min guard;5: Supervision    Ambulation/Gait Assistance Details  into/out of clinic with single crutch. use of bil forearm crutches in session to work on engagement of prosthetich knee hydrualics with swing phase. pt needing to purposly catch his toes on floor to get knee to bend. noted to be tight when engaged in other tasks as well. call placed to [employer] with answering service picking up call.     Ambulation Distance (Feet)  230 Feet   x2, plus around gym   Assistive device  L Forearm Crutch;Prosthesis;Lofstrands    Gait Pattern  Step-through pattern;Decreased arm swing - right;Decreased step length - left;Decreased stance time - right;Decreased stride length;Decreased hip/knee flexion - right;Decreased weight shift to right;Right circumduction;Right hip hike;Right flexed knee in stance;Antalgic;Lateral hip instability;Trunk flexed;Abducted- right;Poor foot clearance -  right    Ambulation Surface  Level;Indoor      Exercises   Exercises  Knee/Hip      Knee/Hip Exercises: Aerobic   Other Aerobic  Scifit UE/LE'S level 4 with goal >/= 60 rpm for 10 minutes for strengthening and activity tolerance      Prosthetics   Prosthetic Care Comments   pt's prosthetic knee hydraulics appear to be too tight at this time, causing pt to have to exert increased effort to get knee to bend. PTA called [employer] with no answer (service picked up). Pt to try calling again later today.     Current prosthetic wear tolerance (days/week)   daily    Current prosthetic wear tolerance (#hours/day)   10-12 hours, drying as needed    Residual limb condition   no issues per pt report    Education Provided  Residual limb care;Correct ply sock adjustment;Proper weight-bearing schedule/adjustment    Person(s) Educated  Patient    Education Method  Explanation;Demonstration;Verbal cues    Education Method  Verbalized understanding;Tactile cues required;Verbal cues required;Needs further instruction    Donning Prosthesis  Supervision    Doffing Prosthesis  Supervision          PT Short Term Goals - 05/25/19 1207      PT SHORT TERM GOAL #1   Title  Patient demonstrates proper donning & verbalizes proper cleaning of prosthesis. (All STGs Target Date: 07/01/2019)    Time  1    Period  Months    Status  New    Target Date  07/01/19      PT SHORT TERM GOAL #2   Title  Patient tolerates prosthesis wear >8 hrs total/day without skin or limb pain issues.     Time  1    Period  Months    Status  New    Target Date  07/01/19      PT SHORT TERM GOAL #3   Title  Patient reaches 7" anteriorly & to floor without loss of balance without UE support with supervision.     Time  1    Period  Months    Status  New    Target Date  07/01/19      PT SHORT TERM GOAL #4   Title  Patient ambulates 400' with bilateral forearm crutches & prosthesis engaging prosthetic knee >50% of steps with  supervision.     Time  1    Period  Months    Status  New    Target Date  07/01/19      PT SHORT TERM GOAL #5  Title  Patient negotiates ramps, curbs with 2 crutches & stairs with 1 rail/1 crutch step-to with prosthesis with supervision.     Time  1    Period  Months    Status  New    Target Date  07/01/19      PT SHORT TERM GOAL #6   Title  Patient descends stairs with 2 rails engaging prosthesis with step-to pattern.     Time  1    Period  Months    Status  New    Target Date  07/01/19        PT Long Term Goals - 05/25/19 1200      PT LONG TERM GOAL #1   Title  Patient verbalizes & demonstrates proper prosthetic care to enable safe use of prosthesis.  (All LTGs Target Date: 08/19/2019)    Time  3    Period  Months    Status  New    Target Date  08/19/19      PT LONG TERM GOAL #2   Title  Patient tolerates prosthesis wear >90% of awake hours without skin or limb pain issues to enable function throughout his day.     Time  3    Period  Months    Status  New    Target Date  08/19/19      PT LONG TERM GOAL #3   Title  Patient reports chronic low back & left knee pain increases <2 increments on 0-10 scale with standing & gait activities.     Time  3    Period  Months    Status  New    Target Date  08/19/19      PT LONG TERM GOAL #4   Title  Berg Balance >45/56 to indicate lower fall risk.      Time  3    Period  Months    Status  New    Target Date  08/19/19      PT LONG TERM GOAL #5   Title  Functional Gait Assessment with prosthesis only >/= 15/30 to indicate lower fall risk.     Time  3    Period  Months    Status  New    Target Date  08/19/19      PT LONG TERM GOAL #6   Title  Patient ambulates 1000' outdoors including grass with cane or less and prosthesis modified independent to enable community mobility.     Time  3    Period  Months    Status  New    Target Date  08/19/19      PT LONG TERM GOAL #7   Title  Patient negotiates ramps, curbs and  stairs with single rail and cane or less with prosthesis modified independent for community access.     Time  3    Period  Months    Status  New    Target Date  08/19/19            Plan - 06/09/19 0905    Clinical Impression Statement  Today's skilled session continued to address prosthetic education, gait with empahsis on prosthetic knee flexion and education on use of recumbant bike to work on strengthening/activity tolerance. Pt is to call prosthetist for adjustment. The pt is progressing toward goals and should benefit from continued PT to progress toward unmet goals.    Personal Factors and Comorbidities  Comorbidity 3+;Fitness;Past/Current Experience;Time since onset of injury/illness/exacerbation  Comorbidities  TFA, HTN, Glaucoma, DM, alcohol abuse, left knee replacement, bone CA right lower leg 58yo, 2007 right rotator cuff tear. OA    Examination-Activity Limitations  Bend;Carry;Lift;Locomotion Level;Squat;Stairs;Stand;Transfers    Examination-Participation Restrictions  Community Activity;Yard Work    Conservation officer, historic buildingstability/Clinical Decision Making  Evolving/Moderate complexity    Rehab Potential  Good    PT Frequency  2x / week    PT Duration  12 weeks    PT Treatment/Interventions  ADLs/Self Care Home Management;DME Instruction;Gait training;Stair training;Functional mobility training;Therapeutic activities;Therapeutic exercise;Balance training;Neuromuscular re-education;Patient/family education;Prosthetic Training;Vestibular    PT Next Visit Plan  prosthetic gait with bilateral forearm crutches woirking on increased prosthetic knee flexion, standing balance activities, stairs using prosthesis to lower pt down    Consulted and Agree with Plan of Care  Patient       Patient will benefit from skilled therapeutic intervention in order to improve the following deficits and impairments:  Abnormal gait, Decreased activity tolerance, Decreased balance, Decreased endurance, Decreased knowledge  of use of DME, Decreased mobility, Decreased range of motion, Decreased strength, Dizziness, Impaired flexibility, Postural dysfunction, Prosthetic Dependency, Pain  Visit Diagnosis: 1. Unsteadiness on feet   2. Other abnormalities of gait and mobility   3. Abnormal posture        Problem List Patient Active Problem List   Diagnosis Date Noted  . Hypoglycemia 02/18/2017    Sallyanne KusterKathy Herndon Grill, PTA, [facility] [address], [address] [place], [place] [address] [phone] 06/10/19, 12:50 PM   Name: Isaac AlertBilly Morrison MRN: [id number] Date of Birth: [birth year]

## 2019-06-14 ENCOUNTER — Other Ambulatory Visit: Payer: Self-pay

## 2019-06-14 ENCOUNTER — Encounter: Payer: Self-pay | Admitting: Physical Therapy

## 2019-06-14 ENCOUNTER — Ambulatory Visit: Payer: Medicare Other | Admitting: Physical Therapy

## 2019-06-14 DIAGNOSIS — R293 Abnormal posture: Secondary | ICD-10-CM

## 2019-06-14 DIAGNOSIS — R2689 Other abnormalities of gait and mobility: Secondary | ICD-10-CM

## 2019-06-14 DIAGNOSIS — R2681 Unsteadiness on feet: Secondary | ICD-10-CM

## 2019-06-14 NOTE — Patient Instructions (Signed)
Stand at [employer] (on your right).  Keep hips fairly close to counter top (like 2" at most) and cane in right hand.    Stand with sound limb forward step. Draw imaginary line in front of prosthesis & place right foot over the line.  Step 1: Start with weight on prosthesis. Weight shift forward to right leg keeping right knee straight without locking it (don't squat!!). Prosthetic knee should bend and keep prosthetic toe on ground.  Repeat step 1 for 10 reps.  Step 2: Bring prosthesis forward with pelvic rotation & hip flexion (moving leg straight forward-not out to the side-think don't hit the counter top) "placing heel" on floor. Repeat step 1 + step 2 for 10 reps  Step 3: Weight shift forward so pelvis (hips) over prosthesis & right knee is flexing (keep leg relaxed). Maintain right toe on ground. Repeat step 1 + 2 + 3 for 10 reps  Step 4: Step right foot forward stepping over imaginary line (pass the toe of your left foot with the heel of your right foot) in front of prosthesis. Repeat step 1 + 2 + 3 + 4 for 10 reps  *Keep an eye on your foot placement and adjust back to starting position as needed.

## 2019-06-14 NOTE — Therapy (Signed)
[facility] [address] [place], [place], [address] Phone: [phone]   Fax:  [phone]  Physical Therapy Treatment  Patient Details  Name: Isaac Morrison MRN: [id number] Date of Birth: March 23, 1961 Referring Provider (PT): Nelida MeuseBryan Kozak, [place]   Encounter Date: 06/14/2019  PT End of Session - 06/14/19 1206    Visit Number  4    Number of Visits  25    Date for PT Re-Evaluation  08/19/19    Authorization Type  UHC Medicare    Authorization Time Period  Copays, oop, deductables & coinsurance has been waived 04/25/2019 - 09/14/2019    PT Start Time  1200    PT Stop Time  1248    PT Time Calculation (min)  48 min    Equipment Utilized During Treatment  Gait belt    Activity Tolerance  Patient tolerated treatment well    Behavior During Therapy  [facility] for tasks assessed/performed       Past Medical History:  Diagnosis Date  . Diabetes mellitus without complication (HCC)   . Glaucoma   . Hyperlipidemia   . Hypertension     Past Surgical History:  Procedure Laterality Date  . REPLACEMENT TOTAL KNEE BILATERAL Bilateral   . Skin grafts    . TOTAL SHOULDER REPLACEMENT Right     There were no vitals filed for this visit.  Subjective Assessment - 06/14/19 1202    Subjective  See's Brett CanalesSteve on Thursday to have apdjustments made.    Pertinent History  TFA, HTN, Glaucoma, DM, alcohol abuse,     Limitations  Lifting;Standing;Walking;House hold activities    Patient Stated Goals  To use prosthesis to walk better in community. Get on exercise bike.     Currently in Pain?  Yes    Pain Score  7     Pain Location  Back    Pain Orientation  Left;Lower    Pain Descriptors / Indicators  Aching;Sore    Pain Type  Chronic pain    Pain Onset  More than a month ago    Aggravating Factors   sitting too long, immobility    Pain Relieving Factors  pain medication    Pain Score  5    Pain Location  Generalized   knee and shoulder   Pain Orientation  Left;Right   right shoulder, left knee   Pain Descriptors / Indicators  Aching;Sore    Pain Type  Chronic pain    Pain Onset  More than a month ago    Pain Frequency  Constant    Aggravating Factors   standing or walking too long, hopping on one leg prior to getting prosthesis          [facility] Adult PT Treatment/Exercise - 06/14/19 1207      Transfers   Transfers  Sit to Stand;Stand to Sit    Sit to Stand  5: Supervision;With upper extremity assist;With armrests;From chair/3-in-1    Stand to Sit  5: Supervision;With upper extremity assist;With armrests;To chair/3-in-1      Ambulation/Gait   Ambulation/Gait  Yes    Ambulation/Gait Assistance  4: Min guard;5: Supervision    Ambulation/Gait Assistance Details  in session: initiated gait on track with pt continuing to have difficulty advancing prosthesis. Height checked with prosthesis short. removed other shoe with less difficulty noted with prosthesis advancement. pt still with issues flexing knee for swing phase. at counter had pt work on 4 step proccess for proper flexion of prosthetic  knee. Issued this to HEP. worked on carryover with remainder of gait on track.     Ambulation Distance (Feet)  230 Feet   x1, 115 x1, plus around gym with activity   Assistive device  L Forearm Crutch;Prosthesis;Lofstrands    Gait Pattern  Step-through pattern;Decreased arm swing - right;Decreased step length - left;Decreased stance time - right;Decreased stride length;Decreased hip/knee flexion - right;Decreased weight shift to right;Right circumduction;Right hip hike;Right flexed knee in stance;Antalgic;Lateral hip instability;Trunk flexed;Abducted- right;Poor foot clearance - right    Ambulation Surface  Level;Indoor      Prosthetics   Prosthetic Care Comments   see's Brett CanalesSteve at [facility] on Thursday.     Current prosthetic wear tolerance (days/week)   daily    Current prosthetic wear tolerance (#hours/day)   10-12 hours, drying as needed     Residual limb condition   no issues per pt report    Education Provided  Residual limb care;Proper weight-bearing schedule/adjustment   new HEP for prosthetic flexion with gait   Person(s) Educated  Patient    Education Method  Explanation;Demonstration;Tactile cues;Verbal cues;Handout    Education Method  Verbalized understanding;Tactile cues required;Returned demonstration;Verbal cues required;Needs further instruction    Donning Prosthesis  Supervision    Doffing Prosthesis  Supervision             PT Education - 06/14/19 1715    Education Details  new HEP at counter for engaging prosthetic knee hydrualics wtih gait.    Person(s) Educated  Patient    Methods  Explanation;Demonstration;Tactile cues;Verbal cues;Handout    Comprehension  Verbalized understanding;Returned demonstration;Verbal cues required;Tactile cues required;Need further instruction       PT Short Term Goals - 05/25/19 1207      PT SHORT TERM GOAL #1   Title  Patient demonstrates proper donning & verbalizes proper cleaning of prosthesis. (All STGs Target Date: 07/01/2019)    Time  1    Period  Months    Status  New    Target Date  07/01/19      PT SHORT TERM GOAL #2   Title  Patient tolerates prosthesis wear >8 hrs total/day without skin or limb pain issues.     Time  1    Period  Months    Status  New    Target Date  07/01/19      PT SHORT TERM GOAL #3   Title  Patient reaches 7" anteriorly & to floor without loss of balance without UE support with supervision.     Time  1    Period  Months    Status  New    Target Date  07/01/19      PT SHORT TERM GOAL #4   Title  Patient ambulates 400' with bilateral forearm crutches & prosthesis engaging prosthetic knee >50% of steps with supervision.     Time  1    Period  Months    Status  New    Target Date  07/01/19      PT SHORT TERM GOAL #5   Title  Patient negotiates ramps, curbs with 2 crutches & stairs with 1 rail/1 crutch step-to with  prosthesis with supervision.     Time  1    Period  Months    Status  New    Target Date  07/01/19      PT SHORT TERM GOAL #6   Title  Patient descends stairs with 2 rails engaging prosthesis with step-to pattern.  Time  1    Period  Months    Status  New    Target Date  07/01/19        PT Long Term Goals - 05/25/19 1200      PT LONG TERM GOAL #1   Title  Patient verbalizes & demonstrates proper prosthetic care to enable safe use of prosthesis.  (All LTGs Target Date: 08/19/2019)    Time  3    Period  Months    Status  New    Target Date  08/19/19      PT LONG TERM GOAL #2   Title  Patient tolerates prosthesis wear >90% of awake hours without skin or limb pain issues to enable function throughout his day.     Time  3    Period  Months    Status  New    Target Date  08/19/19      PT LONG TERM GOAL #3   Title  Patient reports chronic low back & left knee pain increases <2 increments on 0-10 scale with standing & gait activities.     Time  3    Period  Months    Status  New    Target Date  08/19/19      PT LONG TERM GOAL #4   Title  Berg Balance >45/56 to indicate lower fall risk.      Time  3    Period  Months    Status  New    Target Date  08/19/19      PT LONG TERM GOAL #5   Title  Functional Gait Assessment with prosthesis only >/= 15/30 to indicate lower fall risk.     Time  3    Period  Months    Status  New    Target Date  08/19/19      PT LONG TERM GOAL #6   Title  Patient ambulates 1000' outdoors including grass with cane or less and prosthesis modified independent to enable community mobility.     Time  3    Period  Months    Status  New    Target Date  08/19/19      PT LONG TERM GOAL #7   Title  Patient negotiates ramps, curbs and stairs with single rail and cane or less with prosthesis modified independent for community access.     Time  3    Period  Months    Status  New    Target Date  08/19/19            Plan - 06/14/19 1206     Clinical Impression Statement  Today's skilled session continued to address use of prosthesis and crutches with gait with emphasis on knee flexion with swing phase and extension in stance phase. Improved technique noted after ex's at counter which were issued to HEP. The pt is progressing toward goals and should benefit from continued PT to progress toward unmet goals.    Personal Factors and Comorbidities  Comorbidity 3+;Fitness;Past/Current Experience;Time since onset of injury/illness/exacerbation    Comorbidities  TFA, HTN, Glaucoma, DM, alcohol abuse, left knee replacement, bone CA right lower leg 58yo, 2007 right rotator cuff tear. OA    Examination-Activity Limitations  Bend;Carry;Lift;Locomotion Level;Squat;Stairs;Stand;Transfers    Examination-Participation Restrictions  Community Activity;Yard Work    Stability/Clinical Decision Making  Evolving/Moderate complexity    Rehab Potential  Good    PT Frequency  2x / week    PT Duration  12 weeks    PT Treatment/Interventions  ADLs/Self Care Home Management;DME Instruction;Gait training;Stair training;Functional mobility training;Therapeutic activities;Therapeutic exercise;Balance training;Neuromuscular re-education;Patient/family education;Prosthetic Training;Vestibular    PT Next Visit Plan  prosthetic gait with bilateral forearm crutches woirking on increased prosthetic knee flexion, standing balance activities, stairs using prosthesis to lower pt down    Consulted and Agree with Plan of Care  Patient       Patient will benefit from skilled therapeutic intervention in order to improve the following deficits and impairments:  Abnormal gait, Decreased activity tolerance, Decreased balance, Decreased endurance, Decreased knowledge of use of DME, Decreased mobility, Decreased range of motion, Decreased strength, Dizziness, Impaired flexibility, Postural dysfunction, Prosthetic Dependency, Pain  Visit Diagnosis: 1. Unsteadiness on feet   2.  Other abnormalities of gait and mobility   3. Abnormal posture        Problem List Patient Active Problem List   Diagnosis Date Noted  . Hypoglycemia 02/18/2017    Willow Ora, PTA, [facility] [address] [place], [place] [address] [phone] 06/14/19, 5:17 PM   Name: Isaac Morrison MRN: [id number] Date of Birth: Jan 14, 1961

## 2019-06-28 ENCOUNTER — Encounter: Payer: Self-pay | Admitting: Physical Therapy

## 2019-06-28 ENCOUNTER — Other Ambulatory Visit: Payer: Self-pay

## 2019-06-28 ENCOUNTER — Ambulatory Visit: Payer: Medicare Other | Attending: Physical Medicine & Rehabilitation | Admitting: Physical Therapy

## 2019-06-28 DIAGNOSIS — G8929 Other chronic pain: Secondary | ICD-10-CM | POA: Insufficient documentation

## 2019-06-28 DIAGNOSIS — R2689 Other abnormalities of gait and mobility: Secondary | ICD-10-CM | POA: Insufficient documentation

## 2019-06-28 DIAGNOSIS — R2681 Unsteadiness on feet: Secondary | ICD-10-CM | POA: Diagnosis not present

## 2019-06-28 DIAGNOSIS — R293 Abnormal posture: Secondary | ICD-10-CM | POA: Diagnosis present

## 2019-06-28 DIAGNOSIS — M25651 Stiffness of right hip, not elsewhere classified: Secondary | ICD-10-CM | POA: Diagnosis present

## 2019-06-28 DIAGNOSIS — M25562 Pain in left knee: Secondary | ICD-10-CM | POA: Insufficient documentation

## 2019-06-28 DIAGNOSIS — M545 Low back pain: Secondary | ICD-10-CM | POA: Insufficient documentation

## 2019-06-28 NOTE — Therapy (Addendum)
[facility] [address] [place], [place], [address] Phone: [phone]   Fax:  [phone]  Physical Therapy Treatment  Patient Details  Name: Isaac Morrison MRN: [id number] Date of Birth: May 07, 1961 Referring Provider (PT): Carmin [place]rat, [place]   Encounter Date: 06/28/2019   CLINIC OPERATION CHANGES: Outpatient Neuro Rehab is open at lower capacity following universal masking, social distancing, and patient screening.   PT End of Session - 06/28/19 1004    Visit Number  5    Number of Visits  25    Date for PT Re-Evaluation  08/19/19    Authorization Type  UHC Medicare    Authorization Time Period  Copays, oop, deductables & coinsurance has been waived 04/25/2019 - 09/14/2019    PT Start Time  1001    PT Stop Time  1045    PT Time Calculation (min)  44 min    Equipment Utilized During Treatment  Gait belt    Activity Tolerance  Patient tolerated treatment well    Behavior During Therapy  WFL for tasks assessed/performed       Past Medical History:  Diagnosis Date  . Diabetes mellitus without complication ([place])   . Glaucoma   . Hyperlipidemia   . Hypertension     Past Surgical History:  Procedure Laterality Date  . REPLACEMENT TOTAL KNEE BILATERAL Bilateral   . Skin grafts    . TOTAL SHOULDER REPLACEMENT Right     There were no vitals filed for this visit.  Subjective Assessment - 06/28/19 1004    Subjective  No new complaints. No falls to report.    Pertinent History  TFA, HTN, Glaucoma, DM, alcohol abuse,     Limitations  Lifting;Standing;Walking;House hold activities    Patient Stated Goals  To use prosthesis to walk better in community. Get on exercise bike.             [facility] Adult PT Treatment/Exercise - 06/28/19 1005      Transfers   Transfers  Sit to Stand;Stand to Sit    Sit to Stand  5: Supervision;With upper extremity assist;With armrests;From chair/3-in-1    Stand to Sit  5:  Supervision;With upper extremity assist;With armrests;To chair/3-in-1      Ambulation/Gait   Ambulation/Gait  Yes    Ambulation/Gait Assistance  4: Min guard;5: Supervision    Ambulation/Gait Assistance Details  pt bending knee >/=80% of steps with cues for weight shifting and step length with gait.     Ambulation Distance (Feet)  625 Feet   x1, plus around gym with activity   Assistive device  L Forearm Crutch;Prosthesis;Lofstrands    Gait Pattern  Step-through pattern;Decreased arm swing - right;Decreased step length - left;Decreased stance time - right;Decreased stride length;Decreased hip/knee flexion - right;Decreased weight shift to right;Right circumduction;Right hip hike;Right flexed knee in stance;Antalgic;Lateral hip instability;Trunk flexed;Abducted- right;Poor foot clearance - right    Ambulation Surface  Level;Indoor    Stairs  Yes    Stairs Assistance  5: Supervision;4: Min guard    Stairs Assistance Details (indicate cue type and reason)  cues for increased weight shifting and prosthetic foot placement with descending as he tends to place it too far forward on step.     Stair Management Technique  One rail Right;One rail Left;Step to pattern;Forwards;With crutches    Number of Stairs  4    Height of Stairs  6    Ramp  Other (comment)   min guard assist   Ramp  Details (indicate cue type and reason)  with prosthesis/bil forearm crutches- bloc practice working on sequencing and engagement of prosthetic hydrualic knee. cues for step length, crutch placement/sequencing and weight shiting.     Curb  5: Supervision    Curb Details (indicate cue type and reason)  with prosthesis/bil forearm crutches, cues on sequencing and technique      Therapeutic Activites    Therapeutic Activities  Lifting;Other Therapeutic Activities    Lifting  pt able to engage knee with cues to pick up light object from floor x 2 reps with supervision/min guard assist.  cues on technique to engage prosthe     Other Therapeutic Activities  standing with no UE support- pt able to reach >/=8 inches out of base of support without loss of balance, with supervision.       Prosthetics   Prosthetic Care Comments   Richardson Landry made adjustments to prosthesis, not the computer. Feels better since seeing him.     Current prosthetic wear tolerance (days/week)   daily    Current prosthetic wear tolerance (#hours/day)   10-12 hours, drying as needed    Residual limb condition   no issues per pt report    Education Provided  Residual limb care;Proper weight-bearing schedule/adjustment    Person(s) Educated  Patient    Education Method  Explanation;Demonstration;Verbal cues    Education Method  Verbalized understanding;Returned demonstration;Verbal cues required;Needs further instruction    Donning Prosthesis  Supervision    Doffing Prosthesis  Supervision            PT Short Term Goals - 06/28/19 1006      PT SHORT TERM GOAL #1   Title  Patient demonstrates proper donning & verbalizes proper cleaning of prosthesis. (All STGs Target Date: 07/01/2019)    Baseline  06/28/19: met today    Status  Achieved    Target Date  07/01/19      PT SHORT TERM GOAL #2   Title  Patient tolerates prosthesis wear >8 hrs total/day without skin or limb pain issues.     Baseline  06/28/2019: met to date (wearing up to 10-12 hours total a day)    Status  Achieved    Target Date  07/01/19      PT SHORT TERM GOAL #3   Title  Patient reaches 7" anteriorly & to floor without loss of balance without UE support with supervision.     Baseline  06/28/19: met today in session    Time  --    Period  --    Status  Achieved    Target Date  07/01/19      PT SHORT TERM GOAL #4   Title  Patient ambulates 400' with bilateral forearm crutches & prosthesis engaging prosthetic knee >50% of steps with supervision.     Baseline  06/28/19: met today in session    Time  --    Period  --    Status  Achieved    Target Date  07/01/19      PT  SHORT TERM GOAL #5   Title  Patient negotiates ramps, curbs with 2 crutches & stairs with 1 rail/1 crutch step-to with prosthesis with supervision.     Baseline  06/28/19: met today in session    Time  --    Period  --    Status  Achieved    Target Date  07/01/19      PT SHORT TERM GOAL #6   Title  Patient descends stairs with 2 rails engaging prosthesis with step-to pattern.     Baseline  06/28/19: met today in session with rail/crutch    Time  --    Period  --    Status  Achieved    Target Date  07/01/19        PT Long Term Goals - 05/25/19 1200      PT LONG TERM GOAL #1   Title  Patient verbalizes & demonstrates proper prosthetic care to enable safe use of prosthesis.  (All LTGs Target Date: 08/19/2019)    Time  3    Period  Months    Status  New    Target Date  08/19/19      PT LONG TERM GOAL #2   Title  Patient tolerates prosthesis wear >90% of awake hours without skin or limb pain issues to enable function throughout his day.     Time  3    Period  Months    Status  New    Target Date  08/19/19      PT LONG TERM GOAL #3   Title  Patient reports chronic low back & left knee pain increases <2 increments on 0-10 scale with standing & gait activities.     Time  3    Period  Months    Status  New    Target Date  08/19/19      PT LONG TERM GOAL #4   Title  Berg Balance >45/56 to indicate lower fall risk.      Time  3    Period  Months    Status  New    Target Date  08/19/19      PT LONG TERM GOAL #5   Title  Functional Gait Assessment with prosthesis only >/= 15/30 to indicate lower fall risk.     Time  3    Period  Months    Status  New    Target Date  08/19/19      PT LONG TERM GOAL #6   Title  Patient ambulates 1000' outdoors including grass with cane or less and prosthesis modified independent to enable community mobility.     Time  3    Period  Months    Status  New    Target Date  08/19/19      PT LONG TERM GOAL #7   Title  Patient negotiates ramps,  curbs and stairs with single rail and cane or less with prosthesis modified independent for community access.     Time  3    Period  Months    Status  New    Target Date  08/19/19            Plan - 06/28/19 1005    Clinical Impression Statement  Today's skilled session focused on progress toward STGs with all goals met. Pt continues to need cues to fully weight shifting and engage micoproccessor components of prosthesis. The pt will benefit from contiued PT to progress toward unmet goals.    Personal Factors and Comorbidities  Comorbidity 3+;Fitness;Past/Current Experience;Time since onset of injury/illness/exacerbation    Comorbidities  TFA, HTN, Glaucoma, DM, alcohol abuse, left knee replacement, bone CA right lower leg 58yo, 2007 right rotator cuff tear. OA    Examination-Activity Limitations  Bend;Carry;Lift;Locomotion Level;Squat;Stairs;Stand;Transfers    Examination-Participation Restrictions  Community Activity;Yard Work    [employer]  Evolving/Moderate complexity    Rehab Potential  Good  PT Frequency  2x / week    PT Duration  12 weeks    PT Treatment/Interventions  ADLs/Self Care Home Management;DME Instruction;Gait training;Stair training;Functional mobility training;Therapeutic activities;Therapeutic exercise;Balance training;Neuromuscular re-education;Patient/family education;Prosthetic Training;Vestibular    PT Next Visit Plan  continue with ramp/stairs with using prosthesis to lower; work on turning with prosthesis; work on lifting with prosthesis engaged    Consulted and Agree with Plan of Care  Patient       Patient will benefit from skilled therapeutic intervention in order to improve the following deficits and impairments:  Abnormal gait, Decreased activity tolerance, Decreased balance, Decreased endurance, Decreased knowledge of use of DME, Decreased mobility, Decreased range of motion, Decreased strength, Dizziness, Impaired flexibility,  Postural dysfunction, Prosthetic Dependency, Pain  Visit Diagnosis: 1. Unsteadiness on feet   2. Other abnormalities of gait and mobility   3. Abnormal posture        Problem List Patient Active Problem List   Diagnosis Date Noted  . Hypoglycemia 02/18/2017    Willow Ora, PTA, [facility] [address], [place], [place] [address] [phone] 06/28/19, 12:08 PM   Name: Kevante Lunt MRN: [id number] Date of Birth: 1961/03/31   PT Short Term Goals - 06/28/19 1225      PT SHORT TERM GOAL #1   Title  Patient reports low back pain & left knee pain increases </= 3 increments on 0-10 scale with standing & gait activities with prosthesis. (All STGs Target Date: 07/29/2019)    Time  1    Period  Months    Status  New    Target Date  07/29/19      PT SHORT TERM GOAL #2   Title  Patient tolerates prosthesis wear >14 hrs total/day without skin or limb pain issues.    Time  1    Period  Months    Status  Revised    Target Date  07/28/19      PT SHORT TERM GOAL #3   Title  Patient lifts 10# crate to/from floor engaging prosthesis with supervision.    Time  1    Period  Months    Status  New    Target Date  07/28/19      PT SHORT TERM GOAL #4   Title  Patient ambulates 500' outdoors on paved surfaces with cane & prosthesis engaging prosthetic knee >50% of steps with supervision.    Time  1    Period  Months    Status  Revised    Target Date  07/28/19      PT SHORT TERM GOAL #5   Title  Patient negotiates ramps, curbs with 2 cane & stairs with 1 rail/1 cane step-to with prosthesis with supervision.    Time  1    Period  Months    Status  Revised    Target Date  07/28/19      PT SHORT TERM GOAL #6   Title  Patient descends stairs with 2 rails engaging prosthesis with alternating pattern.    Time  1    Period  Months    Status  Revised    Target Date  07/28/19      Jamey Reas, PT, DPT PT Specializing in [place] 06/28/19 12:34 PM Phone:  [phone]  Fax:  [phone] [facility] [address] [place], [place] [address]

## 2019-06-30 ENCOUNTER — Ambulatory Visit: Payer: Medicare Other | Admitting: Physical Therapy

## 2019-06-30 ENCOUNTER — Encounter: Payer: Self-pay | Admitting: Physical Therapy

## 2019-06-30 ENCOUNTER — Other Ambulatory Visit: Payer: Self-pay

## 2019-06-30 DIAGNOSIS — R2681 Unsteadiness on feet: Secondary | ICD-10-CM | POA: Diagnosis not present

## 2019-06-30 DIAGNOSIS — R2689 Other abnormalities of gait and mobility: Secondary | ICD-10-CM

## 2019-06-30 DIAGNOSIS — R293 Abnormal posture: Secondary | ICD-10-CM

## 2019-07-01 NOTE — Therapy (Signed)
[facility] [address] [place], [place], [address] Phone: [phone]   Fax:  [phone]  Physical Therapy Treatment  Patient Details  Name: Isaac Morrison MRN: [id number] Date of Birth: [birth year] Referring Provider (PT): Nelida MeuseBryan Kozak, [place]   Encounter Date: 06/30/2019   CLINIC OPERATION CHANGES: Outpatient Neuro Rehab is open at lower capacity following universal masking, social distancing, and patient screening.   PT End of Session - 06/30/19 1103    Visit Number  6    Number of Visits  25    Date for PT Re-Evaluation  08/19/19    Authorization Type  [employer]HC Medicare    Authorization Time Period  Copays, oop, deductables & coinsurance has been waived 04/25/2019 - 09/14/2019    PT Start Time  1100    PT Stop Time  1145    PT Time Calculation (min)  45 min    Equipment Utilized During Treatment  Gait belt    Activity Tolerance  Patient tolerated treatment well    Behavior During Therapy  WFL for tasks assessed/performed       Past Medical History:  Diagnosis Date  . Diabetes mellitus without complication (HCC)   . Glaucoma   . Hyperlipidemia   . Hypertension     Past Surgical History:  Procedure Laterality Date  . REPLACEMENT TOTAL KNEE BILATERAL Bilateral   . Skin grafts    . TOTAL SHOULDER REPLACEMENT Right     There were no vitals filed for this visit.  Subjective Assessment - 06/30/19 1102    Subjective  No new complaints. No falls to report.    Pertinent History  TFA, HTN, Glaucoma, DM, alcohol abuse,     Limitations  Lifting;Standing;Walking;House hold activities    Patient Stated Goals  To use prosthesis to walk better in community. Get on exercise bike.     Currently in Pain?  Yes    Pain Score  5     Pain Location  Back    Pain Orientation  Left;Lower    Pain Descriptors / Indicators  Aching;Sore    Pain Type  Chronic pain    Pain Onset  More than a month ago    Pain Frequency  Constant    Aggravating Factors   sitting too log, immobility    Pain Relieving Factors  pain medication    Pain Score  4    Pain Location  Shoulder    Pain Orientation  Right    Pain Descriptors / Indicators  Aching;Sore    Pain Type  Chronic pain    Pain Onset  More than a month ago    Pain Frequency  Constant    Aggravating Factors   certain movements    Pain Relieving Factors  pain meds            [facility] Adult PT Treatment/Exercise - 06/30/19 1104      Transfers   Transfers  Sit to Stand;Stand to Sit    Sit to Stand  5: Supervision;With upper extremity assist;With armrests;From chair/3-in-1    Stand to Sit  5: Supervision;With upper extremity assist;With armrests;To chair/3-in-1      Ambulation/Gait   Ambulation/Gait  Yes    Ambulation/Gait Assistance  4: Min guard;5: Supervision    Ambulation/Gait Assistance Details  use of bil forearm crutches in session- no balance issues with gait outdoors with reminder cues to flex prosthetic knee with swing phase. same cues on less needed basis with gait indoors on  level surfaces as well.     Ambulation Distance (Feet)  500 Feet   x1 in/outdoors; around gym with activities   Assistive device  L Forearm Crutch;Prosthesis;Lofstrands    Gait Pattern  Step-through pattern;Decreased arm swing - right;Decreased step length - left;Decreased stance time - right;Decreased stride length;Decreased hip/knee flexion - right;Decreased weight shift to right;Right circumduction;Right hip hike;Right flexed knee in stance;Antalgic;Lateral hip instability;Trunk flexed;Abducted- right;Poor foot clearance - right    Ambulation Surface  Level;Unlevel;Indoor;Outdoor;Paved;Gravel;Grass    Ramp  Other (comment)    Ramp Details (indicate cue type and reason)  with prosthesis/bil forearm crutches: cues for technique and step length. cues for weight shifting to assist with engaging prosthetic knee with descending ramp. worked on this in [employer] practice.     Gait Comments  use of  treadmill to work on consistent gait speed with engagement of prosthetic knee. bil UE support with min guard assist except for mod assist with prosthetic knee buckling for one episode to assist with balance recovery. on treadmill for total of 8-9 minutes with pt self adjusting speed (max speed 1.5 mph), self pausing (to readjust prosthesis due to rotation) and self starting/stopping treadmill. pt able to step on/off correctly after instruction by PTA.       Therapeutic Activites    Therapeutic Activities  Lifting    Lifting  began with mini squats with UE support, to no UE support. progressing to lifting 10# crate from/to floor for 10 reps, then 7 reps. cues on stance position, technique and how to engage prosthetic knee.                 PT Short Term Goals - 06/28/19 1225      PT SHORT TERM GOAL #1   Title  Patient reports low back pain & left knee pain increases </= 3 increments on 0-10 scale with standing & gait activities with prosthesis. (All STGs Target Date: 07/29/2019)    Time  1    Period  Months    Status  New    Target Date  07/29/19      PT SHORT TERM GOAL #2   Title  Patient tolerates prosthesis wear >14 hrs total/day without skin or limb pain issues.    Time  1    Period  Months    Status  Revised    Target Date  07/28/19      PT SHORT TERM GOAL #3   Title  Patient lifts 10# crate to/from floor engaging prosthesis with supervision.    Time  1    Period  Months    Status  New    Target Date  07/28/19      PT SHORT TERM GOAL #4   Title  Patient ambulates 500' outdoors on paved surfaces with cane & prosthesis engaging prosthetic knee >50% of steps with supervision.    Time  1    Period  Months    Status  Revised    Target Date  07/28/19      PT SHORT TERM GOAL #5   Title  Patient negotiates ramps, curbs with 2 cane & stairs with 1 rail/1 cane step-to with prosthesis with supervision.    Time  1    Period  Months    Status  Revised    Target Date  07/28/19       PT SHORT TERM GOAL #6   Title  Patient descends stairs with 2 rails engaging prosthesis with alternating pattern.  Time  1    Period  Months    Status  Revised    Target Date  07/28/19        PT Long Term Goals - 05/25/19 1200      PT LONG TERM GOAL #1   Title  Patient verbalizes & demonstrates proper prosthetic care to enable safe use of prosthesis.  (All LTGs Target Date: 08/19/2019)    Time  3    Period  Months    Status  New    Target Date  08/19/19      PT LONG TERM GOAL #2   Title  Patient tolerates prosthesis wear >90% of awake hours without skin or limb pain issues to enable function throughout his day.     Time  3    Period  Months    Status  New    Target Date  08/19/19      PT LONG TERM GOAL #3   Title  Patient reports chronic low back & left knee pain increases <2 increments on 0-10 scale with standing & gait activities.     Time  3    Period  Months    Status  New    Target Date  08/19/19      PT LONG TERM GOAL #4   Title  Berg Balance >45/56 to indicate lower fall risk.      Time  3    Period  Months    Status  New    Target Date  08/19/19      PT LONG TERM GOAL #5   Title  Functional Gait Assessment with prosthesis only >/= 15/30 to indicate lower fall risk.     Time  3    Period  Months    Status  New    Target Date  08/19/19      PT LONG TERM GOAL #6   Title  Patient ambulates 1000' outdoors including grass with cane or less and prosthesis modified independent to enable community mobility.     Time  3    Period  Months    Status  New    Target Date  08/19/19      PT LONG TERM GOAL #7   Title  Patient negotiates ramps, curbs and stairs with single rail and cane or less with prosthesis modified independent for community access.     Time  3    Period  Months    Status  New    Target Date  08/19/19            Plan - 06/30/19 1103    Clinical Impression Statement  Today's skilled session continued to focus on use of prosthesis  with gait and balance activities. Educated pt on use of treadmill with prosthesis with on episode of knee buckling when prosthesis rotated in. No additional issues once prosthesis re-aligned. The pt is making steady progress toward goals and should benefit from continued PT to progress toward unmet goals.    Personal Factors and Comorbidities  Comorbidity 3+;Fitness;Past/Current Experience;Time since onset of injury/illness/exacerbation    Comorbidities  TFA, HTN, Glaucoma, DM, alcohol abuse, left knee replacement, bone CA right lower leg 58yo, 2007 right rotator cuff tear. OA    Examination-Activity Limitations  Bend;Carry;Lift;Locomotion Level;Squat;Stairs;Stand;Transfers    Examination-Participation Restrictions  Community Activity;Yard Work    Stability/Clinical Decision Making  Evolving/Moderate complexity    Rehab Potential  Good    PT Frequency  2x / week  PT Duration  12 weeks    PT Treatment/Interventions  ADLs/Self Care Home Management;DME Instruction;Gait training;Stair training;Functional mobility training;Therapeutic activities;Therapeutic exercise;Balance training;Neuromuscular re-education;Patient/family education;Prosthetic Training;Vestibular    PT Next Visit Plan  continue with ramp/stairs with using prosthesis to lower; work on turning with prosthesis; work on lifting with prosthesis engaged    Consulted and Agree with Plan of Care  Patient       Patient will benefit from skilled therapeutic intervention in order to improve the following deficits and impairments:  Abnormal gait, Decreased activity tolerance, Decreased balance, Decreased endurance, Decreased knowledge of use of DME, Decreased mobility, Decreased range of motion, Decreased strength, Dizziness, Impaired flexibility, Postural dysfunction, Prosthetic Dependency, Pain  Visit Diagnosis: 1. Unsteadiness on feet   2. Other abnormalities of gait and mobility   3. Abnormal posture        Problem List Patient  Active Problem List   Diagnosis Date Noted  . Hypoglycemia 02/18/2017    Sallyanne KusterKathy Eastyn Skalla, PTA, [facility] [address], [address] [place], [place] [address] [phone] 07/01/19, 1:11 PM   Name: Isaac Morrison MRN: [id number] Date of Birth: 1961-10-20

## 2019-07-05 ENCOUNTER — Encounter: Payer: Self-pay | Admitting: Physical Therapy

## 2019-07-05 ENCOUNTER — Ambulatory Visit: Payer: Medicare Other | Admitting: Physical Therapy

## 2019-07-05 ENCOUNTER — Other Ambulatory Visit: Payer: Self-pay

## 2019-07-05 DIAGNOSIS — R293 Abnormal posture: Secondary | ICD-10-CM

## 2019-07-05 DIAGNOSIS — R2681 Unsteadiness on feet: Secondary | ICD-10-CM | POA: Diagnosis not present

## 2019-07-05 DIAGNOSIS — G8929 Other chronic pain: Secondary | ICD-10-CM

## 2019-07-05 DIAGNOSIS — M25651 Stiffness of right hip, not elsewhere classified: Secondary | ICD-10-CM

## 2019-07-05 DIAGNOSIS — M545 Low back pain, unspecified: Secondary | ICD-10-CM

## 2019-07-05 DIAGNOSIS — M25562 Pain in left knee: Secondary | ICD-10-CM

## 2019-07-05 DIAGNOSIS — R2689 Other abnormalities of gait and mobility: Secondary | ICD-10-CM

## 2019-07-05 NOTE — Therapy (Signed)
[facility] [address] [place], [place], [address] Phone: [phone]   Fax:  [phone]  Physical Therapy Treatment  Patient Details  Name: Isaac Morrison MRN: [id number] Date of Birth: 1961-02-13 Referring Provider (PT): Nelida MeuseBryan Kozak, [place]   Encounter Date: 07/05/2019   CLINIC OPERATION CHANGES: Outpatient Neuro Rehab is open at lower capacity following universal masking, social distancing, and patient screening.  The patient's COVID risk of complications score is 1.   PT End of Session - 07/05/19 1049    Visit Number  7    Number of Visits  25    Date for PT Re-Evaluation  08/19/19    Authorization Type  [employer]HC Medicare    Authorization Time Period  Copays, oop, deductables & coinsurance has been waived 04/25/2019 - 09/14/2019    PT Start Time  1002    PT Stop Time  1044    PT Time Calculation (min)  42 min    Equipment Utilized During Treatment  Gait belt    Activity Tolerance  Patient tolerated treatment well    Behavior During Therapy  WFL for tasks assessed/performed       Past Medical History:  Diagnosis Date  . Diabetes mellitus without complication (HCC)   . Glaucoma   . Hyperlipidemia   . Hypertension     Past Surgical History:  Procedure Laterality Date  . REPLACEMENT TOTAL KNEE BILATERAL Bilateral   . Skin grafts    . TOTAL SHOULDER REPLACEMENT Right     There were no vitals filed for this visit.  Subjective Assessment - 07/05/19 1004    Subjective  No falls. Isaac has been working on his walking with single crutch and also on lifting things.    Pertinent History  TFA, HTN, Glaucoma, DM, alcohol abuse,     Limitations  Lifting;Standing;Walking;House hold activities    Patient Stated Goals  To use prosthesis to walk better in community. Get on exercise bike.     Currently in Pain?  Yes    Pain Score  4     Pain Location  Back    Pain Orientation  Left    Pain Descriptors / Indicators   Aching;Tightness    Pain Type  Chronic pain    Pain Onset  More than a month ago    Pain Frequency  Constant    Aggravating Factors   standing too long , walking too long & sitting too long    Pain Relieving Factors  meds & resting    Pain Score  4    Pain Location  Knee    Pain Orientation  Left    Pain Descriptors / Indicators  Aching    Pain Type  Chronic pain    Pain Onset  More than a month ago    Pain Frequency  Constant    Aggravating Factors   overworking / overuse    Pain Relieving Factors  resting it                       [facility] Adult PT Treatment/Exercise - 07/05/19 1004      Transfers   Transfers  Sit to Stand;Stand to Sit    Sit to Stand  5: Supervision;With upper extremity assist;With armrests;From chair/3-in-1    Sit to Stand Details  Visual cues for safe use of DME/AE;Verbal cues for safe use of DME/AE    Stand to Sit  5: Supervision;With upper extremity assist;With armrests;To  chair/3-in-1    Stand to Sit Details (indicate cue type and reason)  Visual cues for safe use of DME/AE;Verbal cues for safe use of DME/AE      Ambulation/Gait   Ambulation/Gait  Yes    Ambulation/Gait Assistance  5: Supervision    Ambulation/Gait Assistance Details  visual, tactile & verbal cues on step width, wt shift over prosthesis (cue to pt is belly button shifts to prosthetic big toe) and upright posture.     Ambulation Distance (Feet)  500 Feet    Assistive device  L Forearm Crutch;Prosthesis;Straight cane    Gait Pattern  Step-through pattern;Decreased arm swing - right;Decreased step length - left;Decreased stance time - right;Decreased stride length;Decreased hip/knee flexion - right;Decreased weight shift to right;Right circumduction;Right hip hike;Right flexed knee in stance;Antalgic;Lateral hip instability;Trunk flexed;Abducted- right;Poor foot clearance - right    Ambulation Surface  Level;Indoor    Ramp  --    Pre-Gait Activities  parallel bars with mirror for  visual feedback, tactile & verbal cues on step width (not abducting), wt shift over prosthesis and upright posture.     Gait Comments  use of treadmill up to 1.6mph for 2min to work on fluency of gait.       High Level Balance   High Level Balance Activities  Turns   turning 90* rt & lt from stationary position   High Level Balance Comments  counter support intermittently: demo, verbal & tactile cues on pelvic motion, wt shift & technique with turning with TFA prosthesis      Therapeutic Activites    Therapeutic Activities  Lifting    Lifting  using stand to sit technique to lower towards ground & sit to stand to raise back up.  Lifting towel from floor and progressed to 5# crate with modifications to foot position to accomodate box      Prosthetics   Prosthetic Care Comments   need to check liner for slippage and dry limb/liner.     Current prosthetic wear tolerance (days/week)   daily    Current prosthetic wear tolerance (#hours/day)   10-12 hours, drying as needed    Residual limb condition   no issues per pt report    Education Provided  Skin check;Residual limb care    Person(s) Educated  Patient    Education Method  Explanation;Verbal cues    Education Method  Verbalized understanding    Donning Prosthesis  Supervision    Doffing Prosthesis  Supervision               PT Short Term Goals - 06/28/19 1225      PT SHORT TERM GOAL #1   Title  Patient reports low back pain & left knee pain increases </= 3 increments on 0-10 scale with standing & gait activities with prosthesis. (All STGs Target Date: 07/29/2019)    Time  1    Period  Months    Status  New    Target Date  07/29/19      PT SHORT TERM GOAL #2   Title  Patient tolerates prosthesis wear >14 hrs total/day without skin or limb pain issues.    Time  1    Period  Months    Status  Revised    Target Date  07/28/19      PT SHORT TERM GOAL #3   Title  Patient lifts 10# crate to/from floor engaging prosthesis  with supervision.    Time  1    Period  Months    Status  New    Target Date  07/28/19      PT SHORT TERM GOAL #4   Title  Patient ambulates 500' outdoors on paved surfaces with cane & prosthesis engaging prosthetic knee >50% of steps with supervision.    Time  1    Period  Months    Status  Revised    Target Date  07/28/19      PT SHORT TERM GOAL #5   Title  Patient negotiates ramps, curbs with 2 cane & stairs with 1 rail/1 cane step-to with prosthesis with supervision.    Time  1    Period  Months    Status  Revised    Target Date  07/28/19      PT SHORT TERM GOAL #6   Title  Patient descends stairs with 2 rails engaging prosthesis with alternating pattern.    Time  1    Period  Months    Status  Revised    Target Date  07/28/19        PT Long Term Goals - 05/25/19 1200      PT LONG TERM GOAL #1   Title  Patient verbalizes & demonstrates proper prosthetic care to enable safe use of prosthesis.  (All LTGs Target Date: 08/19/2019)    Time  3    Period  Months    Status  New    Target Date  08/19/19      PT LONG TERM GOAL #2   Title  Patient tolerates prosthesis wear >90% of awake hours without skin or limb pain issues to enable function throughout his day.     Time  3    Period  Months    Status  New    Target Date  08/19/19      PT LONG TERM GOAL #3   Title  Patient reports chronic low back & left knee pain increases <2 increments on 0-10 scale with standing & gait activities.     Time  3    Period  Months    Status  New    Target Date  08/19/19      PT LONG TERM GOAL #4   Title  Berg Balance >45/56 to indicate lower fall risk.      Time  3    Period  Months    Status  New    Target Date  08/19/19      PT LONG TERM GOAL #5   Title  Functional Gait Assessment with prosthesis only >/= 15/30 to indicate lower fall risk.     Time  3    Period  Months    Status  New    Target Date  08/19/19      PT LONG TERM GOAL #6   Title  Patient ambulates 1000'  outdoors including grass with cane or less and prosthesis modified independent to enable community mobility.     Time  3    Period  Months    Status  New    Target Date  08/19/19      PT LONG TERM GOAL #7   Title  Patient negotiates ramps, curbs and stairs with single rail and cane or less with prosthesis modified independent for community access.     Time  3    Period  Months    Status  New    Target Date  08/19/19  Plan - 07/05/19 1238    Clinical Impression Statement  Today's skilled session focused on prosthetic gait decreasing abduction, improving wt shift over prosthesis and progressed to cane. Patient improved ability to engage prosthesis for lifting.    Personal Factors and Comorbidities  Comorbidity 3+;Fitness;Past/Current Experience;Time since onset of injury/illness/exacerbation    Comorbidities  TFA, HTN, Glaucoma, DM, alcohol abuse, left knee replacement, bone CA right lower leg 58yo, 2007 right rotator cuff tear. OA    Examination-Activity Limitations  Bend;Carry;Lift;Locomotion Level;Squat;Stairs;Stand;Transfers    Examination-Participation Restrictions  Community Activity;Yard Work    [employer] officer  Evolving/Moderate complexity    Rehab Potential  Good    PT Frequency  2x / week    PT Duration  12 weeks    PT Treatment/Interventions  ADLs/Self Care Home Management;DME Instruction;Gait training;Stair training;Functional mobility training;Therapeutic activities;Therapeutic exercise;Balance training;Neuromuscular re-education;Patient/family education;Prosthetic Training;Vestibular    PT Next Visit Plan  continue with ramp/stairs with using prosthesis to lower; work on turning with prosthesis; work on lifting with prosthesis engaged    Consulted and Agree with Plan of Care  Patient       Patient will benefit from skilled therapeutic intervention in order to improve the following deficits and impairments:  Abnormal gait, Decreased activity  tolerance, Decreased balance, Decreased endurance, Decreased knowledge of use of DME, Decreased mobility, Decreased range of motion, Decreased strength, Dizziness, Impaired flexibility, Postural dysfunction, Prosthetic Dependency, Pain  Visit Diagnosis: 1. Other abnormalities of gait and mobility   2. Unsteadiness on feet   3. Abnormal posture   4. Chronic left-sided low back pain without sciatica   5. Chronic pain of left knee   6. Stiffness of right hip, not elsewhere classified        Problem List Patient Active Problem List   Diagnosis Date Noted  . Hypoglycemia 02/18/2017    Isaac Morrison PT, DPT 07/05/2019, 12:42 PM  [facility] [address] [place], [place], [address] Phone: [phone]   Fax:  [phone]  Name: Kole Hilyard MRN: [id number] Date of Birth: 1961-12-15

## 2019-07-07 ENCOUNTER — Encounter: Payer: Self-pay | Admitting: Physical Therapy

## 2019-07-07 ENCOUNTER — Other Ambulatory Visit: Payer: Self-pay

## 2019-07-07 ENCOUNTER — Ambulatory Visit: Payer: Medicare Other | Admitting: Physical Therapy

## 2019-07-07 DIAGNOSIS — R2681 Unsteadiness on feet: Secondary | ICD-10-CM | POA: Diagnosis not present

## 2019-07-07 DIAGNOSIS — R2689 Other abnormalities of gait and mobility: Secondary | ICD-10-CM

## 2019-07-07 DIAGNOSIS — R293 Abnormal posture: Secondary | ICD-10-CM

## 2019-07-07 NOTE — Therapy (Signed)
[facility] [address] [place], [place], [address] Phone: [phone]   Fax:  [phone]  Physical Therapy Treatment  Patient Details  Name: Isaac Morrison MRN: [id number] Date of Birth: 05/24/1961 Referring Provider (PT): Nelida MeuseBryan Kozak, [place]   Encounter Date: 07/07/2019   CLINIC OPERATION CHANGES: Outpatient Neuro Rehab is open at lower capacity following universal masking, social distancing, and patient screening.   PT End of Session - 07/07/19 1004    Visit Number  8    Number of Visits  25    Date for PT Re-Evaluation  08/19/19    Authorization Type  [employer] Medicare    Authorization Time Period  Copays, oop, deductables & coinsurance has been waived 04/25/2019 - 09/14/2019    PT Start Time  1000    PT Stop Time  1039    PT Time Calculation (min)  39 min    Equipment Utilized During Treatment  Gait belt    Activity Tolerance  Patient tolerated treatment well    Behavior During Therapy  WFL for tasks assessed/performed       Past Medical History:  Diagnosis Date  . Diabetes mellitus without complication (HCC)   . Glaucoma   . Hyperlipidemia   . Hypertension     Past Surgical History:  Procedure Laterality Date  . REPLACEMENT TOTAL KNEE BILATERAL Bilateral   . Skin grafts    . TOTAL SHOULDER REPLACEMENT Right     There were no vitals filed for this visit.  Subjective Assessment - 07/07/19 1002    Subjective  No falls. Using cane today when arriving to therapy, crutch is in car. No change in pain.    Pertinent History  TFA, HTN, Glaucoma, DM, alcohol abuse,     Patient Stated Goals  To use prosthesis to walk better in community. Get on exercise bike.     Pain Score  4     Pain Location  Back    Pain Orientation  Left    Pain Descriptors / Indicators  Aching;Tightness    Pain Type  Chronic pain    Pain Onset  More than a month ago    Pain Frequency  Constant    Aggravating Factors   standing too long,  walking too long and sitting too long    Pain Relieving Factors  meds and resting    Pain Score  4    Pain Location  Knee    Pain Orientation  Left    Pain Descriptors / Indicators  Aching    Pain Type  Chronic pain    Pain Onset  More than a month ago    Pain Frequency  Constant    Aggravating Factors   over use/increased activity    Pain Relieving Factors  resting it           [facility] Adult PT Treatment/Exercise - 07/07/19 1005      Transfers   Transfers  Sit to Stand;Stand to Sit    Sit to Stand  5: Supervision;With upper extremity assist;With armrests;From chair/3-in-1    Stand to Sit  5: Supervision;With upper extremity assist;With armrests;To chair/3-in-1      Ambulation/Gait   Ambulation/Gait  Yes    Ambulation/Gait Assistance  5: Supervision;4: Min guard;4: Min assist    Ambulation/Gait Assistance Details  supervision with use of cane around gym with cues to keep prosthesis in more and engage knee with swing phase with every step; min guard to min assist for ~  12 feet x2 no AD. pt noted to have prosthesis more abducted when not using AD.     Ambulation Distance (Feet)  --   around gym   Assistive device  Straight cane;Prosthesis    Gait Pattern  Step-through pattern;Decreased arm swing - right;Decreased step length - left;Decreased stance time - right;Decreased stride length;Decreased hip/knee flexion - right;Decreased weight shift to right;Right circumduction;Right hip hike;Right flexed knee in stance;Antalgic;Lateral hip instability;Trunk flexed;Abducted- right;Poor foot clearance - right    Ambulation Surface  Level;Indoor    Stairs  Yes    Stairs Assistance  5: Supervision;4: Min guard    Stairs Assistance Details (indicate cue type and reason)  cues for prosthetic foot placement when riding prosthesis down, for weight shifting and sequencing with cane/rail.     Stair Management Technique  One rail Right;One rail Left;Step to pattern;Forwards;With cane    Number of  Stairs  4   bloc practice   Height of Stairs  6    Ramp  Other (comment)   min guard assist   Ramp Details (indicate cue type and reason)  bloc practice working to engage prosthetic knee with cues for weight shifting, step length and sequencing with cane.       High Level Balance   High Level Balance Activities  Turns;Side stepping;Backward walking;Tandem walking    High Level Balance Comments  along counter top: walking, turning 180 degrees and going back into walking for several laps with cues for step placement with turns, min guard assist; at counter- side stepping, backward walking and forward tandem gait with cane/prosthesis for 3-4 laps each, min guard to min assist for balance. cues on form and technique.       Therapeutic Activites    Therapeutic Activities  Lifting    Lifting  with staggered stance, prosthesis forward- lifting 5# box from/to floor, 5 reps for 3 sets with cues for weight shifting, use of prosthetic knee; then in wide stance performing modified mini squats to lift 5# crate for 2 sets of 5 reps. min guard assist with cues on techniqe and weight shifting.       Prosthetics   Current prosthetic wear tolerance (days/week)   daily    Current prosthetic wear tolerance (#hours/day)   10-12 hours, drying as needed    Residual limb condition   no issues per pt report    Education Provided  Residual limb care;Other (comment);Correct ply sock adjustment   need to use more socks due to rotation of socket   Person(s) Educated  Patient    Education Method  Explanation;Demonstration;Verbal cues    Education Method  Verbalized understanding;Verbal cues required;Needs further instruction    Donning Prosthesis  Supervision    Doffing Prosthesis  Supervision          PT Short Term Goals - 06/28/19 1225      PT SHORT TERM GOAL #1   Title  Patient reports low back pain & left knee pain increases </= 3 increments on 0-10 scale with standing & gait activities with prosthesis.  (All STGs Target Date: 07/29/2019)    Time  1    Period  Months    Status  New    Target Date  07/29/19      PT SHORT TERM GOAL #2   Title  Patient tolerates prosthesis wear >14 hrs total/day without skin or limb pain issues.    Time  1    Period  Months    Status  Revised  Target Date  07/28/19      PT SHORT TERM GOAL #3   Title  Patient lifts 10# crate to/from floor engaging prosthesis with supervision.    Time  1    Period  Months    Status  New    Target Date  07/28/19      PT SHORT TERM GOAL #4   Title  Patient ambulates 500' outdoors on paved surfaces with cane & prosthesis engaging prosthetic knee >50% of steps with supervision.    Time  1    Period  Months    Status  Revised    Target Date  07/28/19      PT SHORT TERM GOAL #5   Title  Patient negotiates ramps, curbs with 2 cane & stairs with 1 rail/1 cane step-to with prosthesis with supervision.    Time  1    Period  Months    Status  Revised    Target Date  07/28/19      PT SHORT TERM GOAL #6   Title  Patient descends stairs with 2 rails engaging prosthesis with alternating pattern.    Time  1    Period  Months    Status  Revised    Target Date  07/28/19        PT Long Term Goals - 05/25/19 1200      PT LONG TERM GOAL #1   Title  Patient verbalizes & demonstrates proper prosthetic care to enable safe use of prosthesis.  (All LTGs Target Date: 08/19/2019)    Time  3    Period  Months    Status  New    Target Date  08/19/19      PT LONG TERM GOAL #2   Title  Patient tolerates prosthesis wear >90% of awake hours without skin or limb pain issues to enable function throughout his day.     Time  3    Period  Months    Status  New    Target Date  08/19/19      PT LONG TERM GOAL #3   Title  Patient reports chronic low back & left knee pain increases <2 increments on 0-10 scale with standing & gait activities.     Time  3    Period  Months    Status  New    Target Date  08/19/19      PT LONG TERM  GOAL #4   Title  Berg Balance >45/56 to indicate lower fall risk.      Time  3    Period  Months    Status  New    Target Date  08/19/19      PT LONG TERM GOAL #5   Title  Functional Gait Assessment with prosthesis only >/= 15/30 to indicate lower fall risk.     Time  3    Period  Months    Status  New    Target Date  08/19/19      PT LONG TERM GOAL #6   Title  Patient ambulates 1000' outdoors including grass with cane or less and prosthesis modified independent to enable community mobility.     Time  3    Period  Months    Status  New    Target Date  08/19/19      PT LONG TERM GOAL #7   Title  Patient negotiates ramps, curbs and stairs with single rail and cane or less with prosthesis  modified independent for community access.     Time  3    Period  Months    Status  New    Target Date  08/19/19            Plan - 07/07/19 1004    Clinical Impression Statement  Today's skilled session continued to focus on use of prosthesis with gait/barriers/activities with emphasis on engagement of micro proccessor of knee. Pt needs cues for form and technique. The pt is progressing toward goals and should benefit from continued PT to progress toward unmet goals.    Personal Factors and Comorbidities  Comorbidity 3+;Fitness;Past/Current Experience;Time since onset of injury/illness/exacerbation    Comorbidities  TFA, HTN, Glaucoma, DM, alcohol abuse, left knee replacement, bone CA right lower leg 58yo, 2007 right rotator cuff tear. OA    Examination-Activity Limitations  Bend;Carry;Lift;Locomotion Level;Squat;Stairs;Stand;Transfers    Examination-Participation Restrictions  Community Activity;Yard Work    Conservation officer, historic buildingstability/Clinical Decision Making  Evolving/Moderate complexity    Rehab Potential  Good    PT Frequency  2x / week    PT Duration  12 weeks    PT Treatment/Interventions  ADLs/Self Care Home Management;DME Instruction;Gait training;Stair training;Functional mobility  training;Therapeutic activities;Therapeutic exercise;Balance training;Neuromuscular re-education;Patient/family education;Prosthetic Training;Vestibular    PT Next Visit Plan  continue with ramp/stairs with using prosthesis to lower; work on turning with prosthesis; work on lifting with prosthesis engaged    Consulted and Agree with Plan of Care  Patient       Patient will benefit from skilled therapeutic intervention in order to improve the following deficits and impairments:  Abnormal gait, Decreased activity tolerance, Decreased balance, Decreased endurance, Decreased knowledge of use of DME, Decreased mobility, Decreased range of motion, Decreased strength, Dizziness, Impaired flexibility, Postural dysfunction, Prosthetic Dependency, Pain  Visit Diagnosis: 1. Other abnormalities of gait and mobility   2. Unsteadiness on feet   3. Abnormal posture        Problem List Patient Active Problem List   Diagnosis Date Noted  . Hypoglycemia 02/18/2017    Sallyanne KusterKathy Taraann Olthoff, PTA, [facility] [address], [address] [place], [place] [address] [phone] 07/07/19, 10:52 AM   Name: Isaac AlertBilly Morrison MRN: [id number] Date of Birth: 1961/04/21

## 2019-07-11 ENCOUNTER — Ambulatory Visit: Payer: Medicare Other | Admitting: Physical Therapy

## 2019-07-12 ENCOUNTER — Ambulatory Visit: Payer: Medicare Other | Admitting: Physical Therapy

## 2019-07-13 ENCOUNTER — Other Ambulatory Visit: Payer: Self-pay

## 2019-07-13 ENCOUNTER — Encounter: Payer: Self-pay | Admitting: Physical Therapy

## 2019-07-13 ENCOUNTER — Ambulatory Visit: Payer: Medicare Other | Admitting: Physical Therapy

## 2019-07-13 DIAGNOSIS — G8929 Other chronic pain: Secondary | ICD-10-CM

## 2019-07-13 DIAGNOSIS — R293 Abnormal posture: Secondary | ICD-10-CM

## 2019-07-13 DIAGNOSIS — R2681 Unsteadiness on feet: Secondary | ICD-10-CM

## 2019-07-13 DIAGNOSIS — M545 Low back pain, unspecified: Secondary | ICD-10-CM

## 2019-07-13 DIAGNOSIS — R2689 Other abnormalities of gait and mobility: Secondary | ICD-10-CM

## 2019-07-13 DIAGNOSIS — M25651 Stiffness of right hip, not elsewhere classified: Secondary | ICD-10-CM

## 2019-07-13 NOTE — Therapy (Signed)
[facility] [address] [place], [place], [address] Phone: [phone]   Fax:  [phone]  Physical Therapy Treatment  Patient Details  Name: Isaac AlertBilly Wolden MRN: [id number] Date of Birth: Dec 14, 1961 Referring Provider (PT): Nelida MeuseBryan Kozak, [place]   Encounter Date: 07/13/2019   CLINIC OPERATION CHANGES: Outpatient Neuro Rehab is open at lower capacity following universal masking, social distancing, and patient screening.  The patient's COVID risk of complications score is 1.   PT End of Session - 07/13/19 2213    Visit Number  9    Number of Visits  25    Date for PT Re-Evaluation  08/19/19    Authorization Type  [employer] Medicare    Authorization Time Period  Copays, oop, deductables & coinsurance has been waived 04/25/2019 - 09/14/2019    PT Start Time  1302    PT Stop Time  1345    PT Time Calculation (min)  43 min    Equipment Utilized During Treatment  Gait belt    Activity Tolerance  Patient tolerated treatment well    Behavior During Therapy  WFL for tasks assessed/performed       Past Medical History:  Diagnosis Date  . Diabetes mellitus without complication (HCC)   . Glaucoma   . Hyperlipidemia   . Hypertension     Past Surgical History:  Procedure Laterality Date  . REPLACEMENT TOTAL KNEE BILATERAL Bilateral   . Skin grafts    . TOTAL SHOULDER REPLACEMENT Right     There were no vitals filed for this visit.  Subjective Assessment - 07/13/19 1300    Subjective  He has been using cane. No falls. His knee pain is getting better but back is hurting today.    Pertinent History  TFA, HTN, Glaucoma, DM, alcohol abuse,     Patient Stated Goals  To use prosthesis to walk better in community. Get on exercise bike.     Currently in Pain?  Yes    Pain Score  7     Pain Location  Back    Pain Orientation  Posterior;Left    Pain Descriptors / Indicators  Aching    Pain Onset  More than a month ago    Pain  Frequency  Constant    Aggravating Factors   standing & walking    Pain Relieving Factors  leveling pelvis in sitting like PT taught    Multiple Pain Sites  Yes    Pain Score  3    Pain Location  Knee    Pain Orientation  Left    Pain Descriptors / Indicators  Aching    Pain Type  Chronic pain   arthritis   Pain Onset  More than a month ago    Pain Frequency  Constant    Aggravating Factors   standing & walking    Pain Relieving Factors  sitting                       [facility] Adult PT Treatment/Exercise - 07/13/19 1300      Transfers   Transfers  Sit to Stand;Stand to Sit    Sit to Stand  5: Supervision;With upper extremity assist;With armrests;From chair/3-in-1    Sit to Stand Details (indicate cue type and reason)  verbal & demo cues on prosthesis control & timing with moving posteriorly upon arising    Stand to Sit  5: Supervision;With upper extremity assist;With armrests;To chair/3-in-1  Stand to Sit Details  cues on engaging prosthesis      Ambulation/Gait   Ambulation/Gait  Yes    Ambulation/Gait Assistance  5: Supervision;4: Min guard    Ambulation/Gait Assistance Details  Pt able to flex prosthetic on straight path. PT demo, tactile & verbal cues on flexing prostheic knee in turns.  Using cones as gates for target turns (initially gates offset 1', then offset 2', and progressed to 90* turns    Ambulation Distance (Feet)  300 Feet    Assistive device  Straight cane;Prosthesis    Gait Pattern  Step-through pattern;Decreased arm swing - right;Decreased step length - left;Decreased stance time - right;Decreased stride length;Decreased hip/knee flexion - right;Decreased weight shift to right;Right circumduction;Right hip hike;Right flexed knee in stance;Antalgic;Lateral hip instability;Trunk flexed;Abducted- right;Poor foot clearance - right    Ambulation Surface  Indoor;Level    Stairs  Yes    Stairs Assistance  5: Supervision    Stairs Assistance Details  (indicate cue type and reason)  descending "riding" hydraulics with tactile, demo & verbal cues for prosthetic control.  Progressed from step-to pattern to alternate pattern with 2 rails, then step-to with single rail & cane    Stair Management Technique  Two rails;Step to pattern;Forwards;Alternating pattern;One rail Right;One rail Left;With cane    Number of Stairs  5   multiple reps w/2 rails step-to & alt, then rail/cane step-t   Height of Stairs  6    Ramp  Other (comment)      High Level Balance   High Level Balance Activities  Turns;Side stepping;Backward walking;Negotitating around obstacles    High Level Balance Comments  cane & prosthesis: tactile & verbal cues on wt shift / technique with TFA prosthesis      Therapeutic Activites    Therapeutic Activities  Lifting    Lifting  with staggered stance, prosthesis forward- lifting 5# box from/to floor, 5 reps for 3 sets with cues for weight shifting, use of prosthetic knee; then in wide stance performing modified mini squats to lift 5# crate for 2 sets of 5 reps. min guard assist with cues on techniqe and weight shifting.       Prosthetics   Current prosthetic wear tolerance (days/week)   daily    Current prosthetic wear tolerance (#hours/day)   10-12 hours, drying as needed    Residual limb condition   no issues per pt report    Education Provided  Residual limb care;Correct ply sock adjustment    Person(s) Educated  Patient    Education Method  Explanation;Verbal cues    Education Method  Verbalized understanding;Needs further instruction               PT Short Term Goals - 06/28/19 1225      PT SHORT TERM GOAL #1   Title  Patient reports low back pain & left knee pain increases </= 3 increments on 0-10 scale with standing & gait activities with prosthesis. (All STGs Target Date: 07/29/2019)    Time  1    Period  Months    Status  New    Target Date  07/29/19      PT SHORT TERM GOAL #2   Title  Patient tolerates  prosthesis wear >14 hrs total/day without skin or limb pain issues.    Time  1    Period  Months    Status  Revised    Target Date  07/28/19      PT SHORT TERM GOAL #  3   Title  Patient lifts 10# crate to/from floor engaging prosthesis with supervision.    Time  1    Period  Months    Status  New    Target Date  07/28/19      PT SHORT TERM GOAL #4   Title  Patient ambulates 500' outdoors on paved surfaces with cane & prosthesis engaging prosthetic knee >50% of steps with supervision.    Time  1    Period  Months    Status  Revised    Target Date  07/28/19      PT SHORT TERM GOAL #5   Title  Patient negotiates ramps, curbs with 2 cane & stairs with 1 rail/1 cane step-to with prosthesis with supervision.    Time  1    Period  Months    Status  Revised    Target Date  07/28/19      PT SHORT TERM GOAL #6   Title  Patient descends stairs with 2 rails engaging prosthesis with alternating pattern.    Time  1    Period  Months    Status  Revised    Target Date  07/28/19        PT Long Term Goals - 05/25/19 1200      PT LONG TERM GOAL #1   Title  Patient verbalizes & demonstrates proper prosthetic care to enable safe use of prosthesis.  (All LTGs Target Date: 08/19/2019)    Time  3    Period  Months    Status  New    Target Date  08/19/19      PT LONG TERM GOAL #2   Title  Patient tolerates prosthesis wear >90% of awake hours without skin or limb pain issues to enable function throughout his day.     Time  3    Period  Months    Status  New    Target Date  08/19/19      PT LONG TERM GOAL #3   Title  Patient reports chronic low back & left knee pain increases <2 increments on 0-10 scale with standing & gait activities.     Time  3    Period  Months    Status  New    Target Date  08/19/19      PT LONG TERM GOAL #4   Title  Berg Balance >45/56 to indicate lower fall risk.      Time  3    Period  Months    Status  New    Target Date  08/19/19      PT LONG TERM GOAL  #5   Title  Functional Gait Assessment with prosthesis only >/= 15/30 to indicate lower fall risk.     Time  3    Period  Months    Status  New    Target Date  08/19/19      PT LONG TERM GOAL #6   Title  Patient ambulates 1000' outdoors including grass with cane or less and prosthesis modified independent to enable community mobility.     Time  3    Period  Months    Status  New    Target Date  08/19/19      PT LONG TERM GOAL #7   Title  Patient negotiates ramps, curbs and stairs with single rail and cane or less with prosthesis modified independent for community access.     Time  3  Period  Months    Status  New    Target Date  08/19/19            Plan - 07/13/19 2214    Clinical Impression Statement  Today's session focused on prosthetic knee flexion with turns or negotiating around obstacles.  PT also worked on engaging prosthetic knee descending stairs.    Personal Factors and Comorbidities  Comorbidity 3+;Fitness;Past/Current Experience;Time since onset of injury/illness/exacerbation    Comorbidities  TFA, HTN, Glaucoma, DM, alcohol abuse, left knee replacement, bone CA right lower leg 58yo, 2007 right rotator cuff tear. OA    Examination-Activity Limitations  Bend;Carry;Lift;Locomotion Level;Squat;Stairs;Stand;Transfers    Examination-Participation Restrictions  Community Activity;Yard Work    Conservation officer, historic buildingstability/Clinical Decision Making  Evolving/Moderate complexity    Rehab Potential  Good    PT Frequency  2x / week    PT Duration  12 weeks    PT Treatment/Interventions  ADLs/Self Care Home Management;DME Instruction;Gait training;Stair training;Functional mobility training;Therapeutic activities;Therapeutic exercise;Balance training;Neuromuscular re-education;Patient/family education;Prosthetic Training;Vestibular    PT Next Visit Plan  continue with ramp/stairs with using prosthesis to lower; work on turning with prosthesis; work on lifting with prosthesis engaged     Consulted and Agree with Plan of Care  Patient       Patient will benefit from skilled therapeutic intervention in order to improve the following deficits and impairments:  Abnormal gait, Decreased activity tolerance, Decreased balance, Decreased endurance, Decreased knowledge of use of DME, Decreased mobility, Decreased range of motion, Decreased strength, Dizziness, Impaired flexibility, Postural dysfunction, Prosthetic Dependency, Pain  Visit Diagnosis: 1. Other abnormalities of gait and mobility   2. Unsteadiness on feet   3. Abnormal posture   4. Chronic left-sided low back pain without sciatica   5. Chronic pain of left knee   6. Stiffness of right hip, not elsewhere classified        Problem List Patient Active Problem List   Diagnosis Date Noted  . Hypoglycemia 02/18/2017    Gladie Gravette PT, DPT 07/13/2019, 10:17 PM  [facility] [address] [place], [place], [address] Phone: [phone]   Fax:  [phone]  Name: Isaac AlertBilly Nilson MRN: [id number] Date of Birth: Aug 01, 1961

## 2019-07-25 ENCOUNTER — Encounter: Payer: Self-pay | Admitting: Physical Therapy

## 2019-07-25 ENCOUNTER — Ambulatory Visit: Payer: Medicare Other | Attending: Physical Medicine & Rehabilitation | Admitting: Physical Therapy

## 2019-07-25 ENCOUNTER — Other Ambulatory Visit: Payer: Self-pay

## 2019-07-25 DIAGNOSIS — M6281 Muscle weakness (generalized): Secondary | ICD-10-CM | POA: Diagnosis present

## 2019-07-25 DIAGNOSIS — R2681 Unsteadiness on feet: Secondary | ICD-10-CM

## 2019-07-25 DIAGNOSIS — M545 Low back pain, unspecified: Secondary | ICD-10-CM

## 2019-07-25 DIAGNOSIS — M25562 Pain in left knee: Secondary | ICD-10-CM | POA: Insufficient documentation

## 2019-07-25 DIAGNOSIS — G8929 Other chronic pain: Secondary | ICD-10-CM | POA: Insufficient documentation

## 2019-07-25 DIAGNOSIS — R2689 Other abnormalities of gait and mobility: Secondary | ICD-10-CM

## 2019-07-25 DIAGNOSIS — M25651 Stiffness of right hip, not elsewhere classified: Secondary | ICD-10-CM | POA: Diagnosis present

## 2019-07-25 DIAGNOSIS — R293 Abnormal posture: Secondary | ICD-10-CM | POA: Diagnosis present

## 2019-07-26 NOTE — Therapy (Addendum)
[facility] [address] [place], [place], [address] Phone: [phone]   Fax:  [phone]  Physical Therapy Treatment & 10th Visit Progress Note  Patient Details  Name: Isaac Morrison MRN: [id number] Date of Birth: 07-19-1961 Referring Provider (PT): Carmin Muskrat, [place]   Encounter Date: 07/25/2019  PT End of Session - 07/25/19 1105    Visit Number  10    Number of Visits  25    Date for PT Re-Evaluation  08/19/19    Authorization Type  [employer] Medicare    Authorization Time Period  Copays, oop, deductables & coinsurance has been waived 04/25/2019 - 09/14/2019    PT Start Time  1101    PT Stop Time  1144    PT Time Calculation (min)  43 min    Equipment Utilized During Treatment  Gait belt    Activity Tolerance  Patient tolerated treatment well    Behavior During Therapy  [facility] for tasks assessed/performed       Past Medical History:  Diagnosis Date  . Diabetes mellitus without complication ([place])   . Glaucoma   . Hyperlipidemia   . Hypertension     Past Surgical History:  Procedure Laterality Date  . REPLACEMENT TOTAL KNEE BILATERAL Bilateral   . Skin grafts    . TOTAL SHOULDER REPLACEMENT Right     There were no vitals filed for this visit.  Subjective Assessment - 07/25/19 1104    Subjective  No new complaints. No falls. Usual knee and back pain.    Pertinent History  TFA, HTN, Glaucoma, DM, alcohol abuse,     Limitations  Lifting;Standing;Walking;House hold activities    Patient Stated Goals  To use prosthesis to walk better in community. Get on exercise bike.     Currently in Pain?  Yes    Pain Score  6     Pain Location  Back    Pain Orientation  Lower;Left    Pain Descriptors / Indicators  Aching    Pain Type  Chronic pain    Pain Onset  More than a month ago    Pain Frequency  Constant    Aggravating Factors   standing and walking    Pain Relieving Factors  rest    Pain Score  3    Pain Location   Knee    Pain Orientation  Left    Pain Descriptors / Indicators  Aching    Pain Type  Chronic pain    Pain Onset  More than a month ago    Pain Frequency  Constant    Aggravating Factors   standing and walking    Pain Relieving Factors  sitting             [facility] Adult PT Treatment/Exercise - 07/25/19 1107      Transfers   Transfers  Sit to Stand;Stand to Sit    Sit to Stand  5: Supervision;With upper extremity assist;With armrests;From chair/3-in-1    Stand to Sit  5: Supervision;With upper extremity assist;With armrests;To chair/3-in-1      Ambulation/Gait   Ambulation/Gait  Yes    Ambulation/Gait Assistance  5: Supervision    Ambulation/Gait Assistance Details  pt with prosthetic knee flexion with swing phase of gait ~50% of distance outdoors, otherwise reminder cues needed.     Ambulation Distance (Feet)  500 Feet    Assistive device  Straight cane;Prosthesis    Gait Pattern  Step-through pattern;Decreased arm swing - right;Decreased step  length - left;Decreased stance time - right;Decreased stride length;Decreased hip/knee flexion - right;Decreased weight shift to right;Right circumduction;Right hip hike;Right flexed knee in stance;Antalgic;Lateral hip instability;Trunk flexed;Abducted- right;Poor foot clearance - right    Ambulation Surface  Level;Unlevel;Indoor;Outdoor;Paved    Stairs  Yes    Stairs Assistance  5: Supervision    Stairs Assistance Details (indicate cue type and reason)  1 rep each with cane/rail with min guard assist with pt using prosthesis to lower other leg down; supervision with bil rails for use of prosthesis to lower other leg, progressing to reciprocal pattern as reps progressed.     Stair Management Technique  One rail Right;One rail Left;Two rails;Step to pattern;Alternating pattern;Forwards    Number of Stairs  4    Height of Stairs  6    Ramp  5: Supervision    Ramp Details (indicate cue type and reason)  with cane/prosthesis for 3 reps, engaging  prosthesis each time    Curb  5: Supervision    Curb Details (indicate cue type and reason)  with cane/prosthesis      High Level Balance   High Level Balance Activities  Negotitating around obstacles;Negotiating over obstacles    High Level Balance Comments  with cane/prosthesis: forward stepping over bolsters of varied heights for 6 laps with min guard assist, reminder cue for correct sequencing; figure 8's around hoola hoops on floor with reminder cues on step length/step placement for proper pelvic alignment with gait.       Therapeutic Activites    Therapeutic Activities  Lifting    Lifting  with staggered stance, prosthesis forward- lifting 10# crate from/to floor, 5 reps for 3 sets with cues for weight shifting, use of prosthetic knee; then in wide stance performing modified mini squats to lift 10# crate for 3 sets of 5 reps. min guard assist to min assist with some posterior balance loss with wide stance (no balance loss iwth staggered stance) with cues on techniqe and weight shifting.           PT Short Term Goals - 07/25/19 1106      PT SHORT TERM GOAL #1   Title  Patient reports low back pain & left knee pain increases </= 3 increments on 0-10 scale with standing & gait activities with prosthesis. (All STGs Target Date: 07/29/2019)    Baseline  07/25/19: met today with no change in back pain and knee pain increased by </=3 increments with lifting.    Time  --    Period  --    Status  Achieved    Target Date  07/29/19      PT SHORT TERM GOAL #2   Title  Patient tolerates prosthesis wear >14 hrs total/day without skin or limb pain issues.    Baseline  07/25/19: wearing most awake hours    Status  Achieved    Target Date  07/28/19      PT SHORT TERM GOAL #3   Title  Patient lifts 10# crate to/from floor engaging prosthesis with supervision.    Baseline  07/25/19: met today    Time  --    Period  --    Status  Achieved    Target Date  07/28/19      PT SHORT TERM GOAL #4    Title  Patient ambulates 500' outdoors on paved surfaces with cane & prosthesis engaging prosthetic knee >50% of steps with supervision.    Baseline  07/25/19: met today  Time  --    Period  --    Status  Achieved    Target Date  07/28/19      PT SHORT TERM GOAL #5   Title  Patient negotiates ramps, curbs with 2 cane & stairs with 1 rail/1 cane step-to with prosthesis with supervision.    Baseline  07/25/19: met today    Time  --    Period  --    Status  Achieved    Target Date  07/28/19      PT SHORT TERM GOAL #6   Title  Patient descends stairs with 2 rails engaging prosthesis with alternating pattern.    Time  1    Period  Months    Status  Revised    Target Date  07/28/19        PT Long Term Goals - 05/25/19 1200      PT LONG TERM GOAL #1   Title  Patient verbalizes & demonstrates proper prosthetic care to enable safe use of prosthesis.  (All LTGs Target Date: 08/19/2019)    Time  3    Period  Months    Status  New    Target Date  08/19/19      PT LONG TERM GOAL #2   Title  Patient tolerates prosthesis wear >90% of awake hours without skin or limb pain issues to enable function throughout his day.     Time  3    Period  Months    Status  New    Target Date  08/19/19      PT LONG TERM GOAL #3   Title  Patient reports chronic low back & left knee pain increases <2 increments on 0-10 scale with standing & gait activities.     Time  3    Period  Months    Status  New    Target Date  08/19/19      PT LONG TERM GOAL #4   Title  Berg Balance >45/56 to indicate lower fall risk.      Time  3    Period  Months    Status  New    Target Date  08/19/19      PT LONG TERM GOAL #5   Title  Functional Gait Assessment with prosthesis only >/= 15/30 to indicate lower fall risk.     Time  3    Period  Months    Status  New    Target Date  08/19/19      PT LONG TERM GOAL #6   Title  Patient ambulates 1000' outdoors including grass with cane or less and prosthesis modified  independent to enable community mobility.     Time  3    Period  Months    Status  New    Target Date  08/19/19      PT LONG TERM GOAL #7   Title  Patient negotiates ramps, curbs and stairs with single rail and cane or less with prosthesis modified independent for community access.     Time  3    Period  Months    Status  New    Target Date  08/19/19            Plan - 07/25/19 1106    Clinical Impression Statement  Today's skilled session focused on progress toward STGs with all goals met. The pt is making steady progress toward LTGs and should benefit from continued PT to progress  toward unmet goals.    Personal Factors and Comorbidities  Comorbidity 3+;Fitness;Past/Current Experience;Time since onset of injury/illness/exacerbation    Comorbidities  TFA, HTN, Glaucoma, DM, alcohol abuse, left knee replacement, bone CA right lower leg 58yo, 2007 right rotator cuff tear. OA    Examination-Activity Limitations  Bend;Carry;Lift;Locomotion Level;Squat;Stairs;Stand;Transfers    Examination-Participation Restrictions  Community Activity;Yard Work    [employer] officer  Evolving/Moderate complexity    Rehab Potential  Good    PT Frequency  2x / week    PT Duration  12 weeks    PT Treatment/Interventions  ADLs/Self Care Home Management;DME Instruction;Gait training;Stair training;Functional mobility training;Therapeutic activities;Therapeutic exercise;Balance training;Neuromuscular re-education;Patient/family education;Prosthetic Training;Vestibular    PT Next Visit Plan  continue with ramp/stairs with using prosthesis to lower; work on turning with prosthesis; work on lifting with prosthesis engaged    Consulted and Agree with Plan of Care  Patient       Patient will benefit from skilled therapeutic intervention in order to improve the following deficits and impairments:  Abnormal gait, Decreased activity tolerance, Decreased balance, Decreased endurance, Decreased  knowledge of use of DME, Decreased mobility, Decreased range of motion, Decreased strength, Dizziness, Impaired flexibility, Postural dysfunction, Prosthetic Dependency, Pain  Visit Diagnosis: 1. Other abnormalities of gait and mobility   2. Unsteadiness on feet   3. Abnormal posture   4. Chronic left-sided low back pain without sciatica   5. Chronic pain of left knee        Problem List Patient Active Problem List   Diagnosis Date Noted  . Hypoglycemia 02/18/2017    Willow Ora, PTA, [facility] [address] [place], [place] [address] [phone] 07/26/19, 3:36 PM   Name: Isaac Morrison MRN: [id number] Date of Birth: 1961-08-08   Progress Note Reporting Period 05/24/2019 to 07/25/2019  See note above for Objective Data and Assessment of Progress/Goals.   Jamey Reas, PT, DPT PT Specializing in [place] 07/27/19 7:24 AM Phone:  [phone]  Fax:  [phone] [facility] [address] [place], [place] [address]

## 2019-07-27 ENCOUNTER — Ambulatory Visit: Payer: Medicare Other | Admitting: Physical Therapy

## 2019-07-27 ENCOUNTER — Encounter: Payer: Self-pay | Admitting: Physical Therapy

## 2019-07-27 ENCOUNTER — Other Ambulatory Visit: Payer: Self-pay

## 2019-07-27 DIAGNOSIS — R293 Abnormal posture: Secondary | ICD-10-CM

## 2019-07-27 DIAGNOSIS — M545 Low back pain, unspecified: Secondary | ICD-10-CM

## 2019-07-27 DIAGNOSIS — R2689 Other abnormalities of gait and mobility: Secondary | ICD-10-CM

## 2019-07-27 DIAGNOSIS — M25562 Pain in left knee: Secondary | ICD-10-CM

## 2019-07-27 DIAGNOSIS — G8929 Other chronic pain: Secondary | ICD-10-CM

## 2019-07-27 DIAGNOSIS — R2681 Unsteadiness on feet: Secondary | ICD-10-CM

## 2019-07-27 DIAGNOSIS — M25651 Stiffness of right hip, not elsewhere classified: Secondary | ICD-10-CM

## 2019-07-27 NOTE — Therapy (Signed)
[facility] [address] [place], [place], [address] Phone: [phone]   Fax:  [phone]  Physical Therapy Treatment  Patient Details  Name: Isaac Morrison MRN: [id number] Date of Birth: 05-14-1961 Referring Provider (PT): Carmin Muskrat, [place]   Encounter Date: 07/27/2019   CLINIC OPERATION CHANGES: [facility] is open at lower capacity following universal masking, social distancing, and patient screening.  The patient's COVID risk of complications score is 1.   PT End of Session - 07/27/19 1153    Visit Number  11    Number of Visits  25    Date for PT Re-Evaluation  08/19/19    Authorization Type  [employer]HC Medicare    Authorization Time Period  Copays, oop, deductables & coinsurance has been waived 04/25/2019 - 09/14/2019    PT Start Time  1100    PT Stop Time  1142    PT Time Calculation (min)  42 min    Equipment Utilized During Treatment  Gait belt    Activity Tolerance  Patient tolerated treatment well    Behavior During Therapy  WFL for tasks assessed/performed       Past Medical History:  Diagnosis Date  . Diabetes mellitus without complication ([place])   . Glaucoma   . Hyperlipidemia   . Hypertension     Past Surgical History:  Procedure Laterality Date  . REPLACEMENT TOTAL KNEE BILATERAL Bilateral   . Skin grafts    . TOTAL SHOULDER REPLACEMENT Right     There were no vitals filed for this visit.  Subjective Assessment - 07/27/19 1105    Subjective  He is wearing prosthesis all awake hours with no issues. No falls.    Pertinent History  TFA, HTN, Glaucoma, DM, alcohol abuse,     Limitations  Lifting;Standing;Walking;House hold activities    Patient Stated Goals  To use prosthesis to walk better in community. Get on exercise bike.     Currently in Pain?  Yes    Pain Score  5     Pain Location  Back    Pain Orientation  Lower;Left    Pain Descriptors / Indicators  Aching    Pain Type   Chronic pain    Pain Onset  More than a month ago    Pain Frequency  Constant    Aggravating Factors   standing & gait    Pain Relieving Factors  resting    Pain Score  5    Pain Location  Knee    Pain Orientation  Left    Pain Descriptors / Indicators  Aching    Pain Type  Chronic pain    Pain Onset  More than a month ago    Pain Frequency  Constant    Aggravating Factors   standing & gait    Pain Relieving Factors  sitting                       [facility] Adult PT Treatment/Exercise - 07/27/19 1100      Transfers   Transfers  Sit to Stand;Stand to Sit    Sit to Stand  5: Supervision;With upper extremity assist;With armrests;From chair/3-in-1    Stand to Sit  5: Supervision;With upper extremity assist;With armrests;To chair/3-in-1      Ambulation/Gait   Ambulation/Gait  Yes    Ambulation/Gait Assistance  5: Supervision    Ambulation/Gait Assistance Details  prosthesis is ~1/2" too short. Pt to see prosthetist.  Verbal cues on upright posture, flexing knee in turns & direction changes    Ambulation Distance (Feet)  500 Feet   500' outdoors + multiple around gym   Assistive device  Straight cane;Prosthesis;None    Gait Pattern  Step-through pattern;Decreased arm swing - right;Decreased step length - left;Decreased stance time - right;Decreased stride length;Decreased hip/knee flexion - right;Decreased weight shift to right;Right circumduction;Right hip hike;Right flexed knee in stance;Antalgic;Lateral hip instability;Trunk flexed;Abducted- right;Poor foot clearance - right    Ambulation Surface  Level;Indoor;Outdoor;Paved;Grass    Stairs  Yes    Stairs Assistance  5: Supervision    Stairs Assistance Details (indicate cue type and reason)  verbal cues on engaging prosthesis descending    Stair Management Technique  One rail Right;One rail Left;Two rails;Step to pattern;Alternating pattern;Forwards    Number of Stairs  4   5 reps   Height of Stairs  6    Ramp  5:  Supervision;4: Min assist   SBA prosthesis & cane and minA prosthesis only   Ramp Details (indicate cue type and reason)  verbal & tactile cues on technique to engage prosthetic hydraulics    Curb  5: Supervision;4: Min assist   supervison w/cane & MinA prosthesis only   Curb Details (indicate cue type and reason)  verbal cues on using momentum & step thru      High Level Balance   High Level Balance Activities  Negotitating around obstacles;Negotiating over obstacles    High Level Balance Comments  with cane/prosthesis: forward stepping over bolsters of varied heights for 6 laps with min guard assist, reminder cue for correct sequencing; figure 8's around hoola hoops on floor with reminder cues on step length/step placement for proper pelvic alignment with gait.       Therapeutic Activites    Therapeutic Activities  Lifting    Lifting  Lifting 10# & 15# box, verbal cues on engaging prosthetic knee to control lowering       Prosthetics   Prosthetic Care Comments   prosthesis is short ~1/2" and scheduled to see prosthetist today after PT.  Instructed in use of SweatBlock wipes.     Current prosthetic wear tolerance (days/week)   daily    Current prosthetic wear tolerance (#hours/day)   most of awake hours, drying as needed    Current prosthetic weight-bearing tolerance (hours/day)   Patient reports Knee & back pain increased from 5/10 to 6/10.     Edema  none    Residual limb condition   no issues per pt report               PT Short Term Goals - 07/27/19 2219      PT SHORT TERM GOAL #1   Title  Patient reports low back pain & left knee pain increases </= 3 increments on 0-10 scale with standing & gait activities with prosthesis. (All STGs Target Date: 07/29/2019)    Baseline  07/25/19: met today with no change in back pain and knee pain increased by </=3 increments with lifting.    Status  Achieved    Target Date  07/29/19      PT SHORT TERM GOAL #2   Title  Patient tolerates  prosthesis wear >14 hrs total/day without skin or limb pain issues.    Baseline  07/25/19: wearing most awake hours    Status  Achieved    Target Date  07/28/19      PT SHORT TERM GOAL #3   Title  Patient lifts 10# crate to/from floor engaging prosthesis with supervision.    Baseline  07/25/19: met today    Status  Achieved    Target Date  07/28/19      PT SHORT TERM GOAL #4   Title  Patient ambulates 500' outdoors on paved surfaces with cane & prosthesis engaging prosthetic knee >50% of steps with supervision.    Baseline  07/25/19: met today    Status  Achieved    Target Date  07/28/19      PT SHORT TERM GOAL #5   Title  Patient negotiates ramps, curbs with 2 cane & stairs with 1 rail/1 cane step-to with prosthesis with supervision.    Baseline  07/25/19: met today    Status  Achieved    Target Date  07/28/19      PT SHORT TERM GOAL #6   Title  Patient descends stairs with 2 rails engaging prosthesis with alternating pattern.    Time  1    Period  Months    Status  Achieved    Target Date  07/28/19        PT Long Term Goals - 07/27/19 2220      PT LONG TERM GOAL #1   Title  Patient verbalizes & demonstrates proper prosthetic care to enable safe use of prosthesis.  (All LTGs Target Date: 08/19/2019)    Time  3    Period  Months    Status  On-going    Target Date  08/19/19      PT LONG TERM GOAL #2   Title  Patient tolerates prosthesis wear >90% of awake hours without skin or limb pain issues to enable function throughout his day.     Time  3    Period  Months    Status  On-going    Target Date  08/19/19      PT LONG TERM GOAL #3   Title  Patient reports chronic low back & left knee pain increases <2 increments on 0-10 scale with standing & gait activities.     Time  3    Period  Months    Status  On-going    Target Date  08/19/19      PT LONG TERM GOAL #4   Title  Berg Balance >45/56 to indicate lower fall risk.      Time  3    Period  Months    Status  On-going     Target Date  08/19/19      PT LONG TERM GOAL #5   Title  Functional Gait Assessment with prosthesis only >/= 15/30 to indicate lower fall risk.     Time  3    Period  Months    Status  On-going    Target Date  08/19/19      PT LONG TERM GOAL #6   Title  Patient ambulates 1000' outdoors including grass with cane or less and prosthesis modified independent to enable community mobility.     Time  3    Period  Months    Status  On-going    Target Date  08/19/19      PT LONG TERM GOAL #7   Title  Patient negotiates ramps, curbs and stairs with single rail and cane or less with prosthesis modified independent for community access.     Time  3    Period  Months    Status  On-going    Target Date  08/19/19            Plan - 07/27/19 2222    Clinical Impression Statement  Patient met all STGs. He is improving with prosthetic gait without device.  Patient's prosthesis is ~1/2" short and scheduled to see prosthetist today.    Personal Factors and Comorbidities  Comorbidity 3+;Fitness;Past/Current Experience;Time since onset of injury/illness/exacerbation    Comorbidities  TFA, HTN, Glaucoma, DM, alcohol abuse, left knee replacement, bone CA right lower leg 58yo, 2007 right rotator cuff tear. OA    Examination-Activity Limitations  Bend;Carry;Lift;Locomotion Level;Squat;Stairs;Stand;Transfers    Examination-Participation Restrictions  Community Activity;Yard Work    [employer] officer  Evolving/Moderate complexity    Rehab Potential  Good    PT Frequency  2x / week    PT Duration  12 weeks    PT Treatment/Interventions  ADLs/Self Care Home Management;DME Instruction;Gait training;Stair training;Functional mobility training;Therapeutic activities;Therapeutic exercise;Balance training;Neuromuscular re-education;Patient/family education;Prosthetic Training;Vestibular    PT Next Visit Plan  work towards LTGs without device except prosthesis    Consulted and Agree with  Plan of Care  Patient       Patient will benefit from skilled therapeutic intervention in order to improve the following deficits and impairments:  Abnormal gait, Decreased activity tolerance, Decreased balance, Decreased endurance, Decreased knowledge of use of DME, Decreased mobility, Decreased range of motion, Decreased strength, Dizziness, Impaired flexibility, Postural dysfunction, Prosthetic Dependency, Pain  Visit Diagnosis: 1. Other abnormalities of gait and mobility   2. Unsteadiness on feet   3. Abnormal posture   4. Chronic left-sided low back pain without sciatica   5. Chronic pain of left knee   6. Stiffness of right hip, not elsewhere classified        Problem List Patient Active Problem List   Diagnosis Date Noted  . Hypoglycemia 02/18/2017    Kerrigan Glendening PT, DPT 07/27/2019, 10:25 PM  [facility] [address] [place], [place], [address] Phone: [phone]   Fax:  [phone]  Name: Najae Rathert MRN: [id number] Date of Birth: [birth year]

## 2019-08-01 ENCOUNTER — Ambulatory Visit: Payer: Medicare Other | Admitting: Physical Therapy

## 2019-08-03 ENCOUNTER — Ambulatory Visit: Payer: Medicare Other | Admitting: Physical Therapy

## 2019-08-03 ENCOUNTER — Encounter: Payer: Self-pay | Admitting: Physical Therapy

## 2019-08-03 ENCOUNTER — Other Ambulatory Visit: Payer: Self-pay

## 2019-08-03 DIAGNOSIS — R2689 Other abnormalities of gait and mobility: Secondary | ICD-10-CM | POA: Diagnosis not present

## 2019-08-03 DIAGNOSIS — R293 Abnormal posture: Secondary | ICD-10-CM

## 2019-08-03 DIAGNOSIS — R2681 Unsteadiness on feet: Secondary | ICD-10-CM

## 2019-08-03 DIAGNOSIS — G8929 Other chronic pain: Secondary | ICD-10-CM

## 2019-08-03 DIAGNOSIS — M25651 Stiffness of right hip, not elsewhere classified: Secondary | ICD-10-CM

## 2019-08-03 NOTE — Therapy (Signed)
[facility] [address] [place], [place], [address] Phone: [phone]   Fax:  [phone]  Physical Therapy Treatment  Patient Details  Name: Isaac Morrison MRN: [id number] Date of Birth: 05-11-1961 Referring Provider (PT): Carmin Muskrat, [place]   Encounter Date: 08/03/2019   CLINIC OPERATION CHANGES: [facility] is open at lower capacity following universal masking, social distancing, and patient screening.  The patient's COVID risk of complications score is 1.   PT End of Session - 08/03/19 1141    Visit Number  12    Number of Visits  25    Date for PT Re-Evaluation  08/19/19    Authorization Type  [employer]HC Medicare    Authorization Time Period  Copays, oop, deductables & coinsurance has been waived 04/25/2019 - 09/14/2019    PT Start Time  1100    PT Stop Time  1142    PT Time Calculation (min)  42 min    Equipment Utilized During Treatment  Gait belt    Activity Tolerance  Patient tolerated treatment well    Behavior During Therapy  WFL for tasks assessed/performed       Past Medical History:  Diagnosis Date  . Diabetes mellitus without complication ([place])   . Glaucoma   . Hyperlipidemia   . Hypertension     Past Surgical History:  Procedure Laterality Date  . REPLACEMENT TOTAL KNEE BILATERAL Bilateral   . Skin grafts    . TOTAL SHOULDER REPLACEMENT Right     There were no vitals filed for this visit.  Subjective Assessment - 08/03/19 1100    Subjective  No falls. No issues with prosthesis.    Pertinent History  TFA, HTN, Glaucoma, DM, alcohol abuse,     Limitations  Lifting;Standing;Walking;House hold activities    Patient Stated Goals  To use prosthesis to walk better in community. Get on exercise bike.     Currently in Pain?  Yes    Pain Score  8     Pain Location  Back    Pain Orientation  Lower;Left    Pain Descriptors / Indicators  Aching    Pain Type  Chronic pain    Pain Onset  More  than a month ago    Pain Frequency  Constant    Aggravating Factors   increased from last session due to helping friend move for 12 hrs 2 days, He was inside truck moving items further into truck.    Pain Relieving Factors  medications, limiting activies    Pain Score  6    Pain Location  Knee    Pain Orientation  Left    Pain Descriptors / Indicators  Aching    Pain Type  Chronic pain    Pain Onset  More than a month ago    Pain Frequency  Constant    Aggravating Factors   increased activity    Pain Relieving Factors  med, llimiting activity                       [facility] Adult PT Treatment/Exercise - 08/03/19 1100      Transfers   Transfers  Sit to Stand;Stand to Sit    Sit to Stand  5: Supervision;With upper extremity assist;With armrests;From chair/3-in-1    Stand to Sit  5: Supervision;With upper extremity assist;With armrests;To chair/3-in-1      Ambulation/Gait   Ambulation/Gait  Yes    Ambulation/Gait Assistance  5: Supervision  Ambulation/Gait Assistance Details  verbal cues on upright posture & step width    Ambulation Distance (Feet)  500 Feet    Assistive device  Straight cane;Prosthesis;None    Gait Pattern  Step-through pattern;Decreased arm swing - right;Decreased step length - left;Decreased stance time - right;Decreased stride length;Decreased hip/knee flexion - right;Decreased weight shift to right;Right circumduction;Right hip hike;Right flexed knee in stance;Antalgic;Lateral hip instability;Trunk flexed;Abducted- right;Poor foot clearance - right    Ambulation Surface  Indoor;Level    Stairs  Yes    Stairs Assistance  5: Supervision    Stairs Assistance Details (indicate cue type and reason)  verbal cues on prosthetic knee control descending    Stair Management Technique  Two rails;One rail Left;One rail Right;Alternating pattern;Forwards    Number of Stairs  4   5 reps 2 rails, 5 reps ea single rail transition to level ga   Height of Stairs  6     Ramp  5: Supervision    Ramp Details (indicate cue type and reason)  verbal & tactile cues on prosthesis engagement    Curb  5: Supervision   prosthesis only    Curb Details (indicate cue type and reason)  verbal cues on technique with prosthesis    Pre-Gait Activities  In parallel bars on inclines with cues on engaging prosthetic knee      High Level Balance   High Level Balance Activities  Negotitating around obstacles;Negotiating over obstacles;Side stepping;Backward walking;Head turns;Figure 8 turns    High Level Balance Comments  with prosthesis only: forward stepping over bolsters of varied heights for 6 laps with min guard assist, reminder cue for correct sequencing; figure 8's around hoola hoops on floor with reminder cues on step length/step placement for proper pelvic alignment with gait.       Therapeutic Activites    Therapeutic Activities  --    Lifting  --      Prosthetics   Prosthetic Care Comments   --    Current prosthetic wear tolerance (days/week)   daily    Current prosthetic wear tolerance (#hours/day)   most of awake hours, drying as needed    Current prosthetic weight-bearing tolerance (hours/day)   Patient reports Knee & back pain increased from 5/10 to 6/10.     Edema  none    Residual limb condition   no issues per pt report               PT Short Term Goals - 07/27/19 2219      PT SHORT TERM GOAL #1   Title  Patient reports low back pain & left knee pain increases </= 3 increments on 0-10 scale with standing & gait activities with prosthesis. (All STGs Target Date: 07/29/2019)    Baseline  07/25/19: met today with no change in back pain and knee pain increased by </=3 increments with lifting.    Status  Achieved    Target Date  07/29/19      PT SHORT TERM GOAL #2   Title  Patient tolerates prosthesis wear >14 hrs total/day without skin or limb pain issues.    Baseline  07/25/19: wearing most awake hours    Status  Achieved    Target Date   07/28/19      PT SHORT TERM GOAL #3   Title  Patient lifts 10# crate to/from floor engaging prosthesis with supervision.    Baseline  07/25/19: met today    Status  Achieved    Target  Date  07/28/19      PT SHORT TERM GOAL #4   Title  Patient ambulates 500' outdoors on paved surfaces with cane & prosthesis engaging prosthetic knee >50% of steps with supervision.    Baseline  07/25/19: met today    Status  Achieved    Target Date  07/28/19      PT SHORT TERM GOAL #5   Title  Patient negotiates ramps, curbs with 2 cane & stairs with 1 rail/1 cane step-to with prosthesis with supervision.    Baseline  07/25/19: met today    Status  Achieved    Target Date  07/28/19      PT SHORT TERM GOAL #6   Title  Patient descends stairs with 2 rails engaging prosthesis with alternating pattern.    Time  1    Period  Months    Status  Achieved    Target Date  07/28/19        PT Long Term Goals - 07/27/19 2220      PT LONG TERM GOAL #1   Title  Patient verbalizes & demonstrates proper prosthetic care to enable safe use of prosthesis.  (All LTGs Target Date: 08/19/2019)    Time  3    Period  Months    Status  On-going    Target Date  08/19/19      PT LONG TERM GOAL #2   Title  Patient tolerates prosthesis wear >90% of awake hours without skin or limb pain issues to enable function throughout his day.     Time  3    Period  Months    Status  On-going    Target Date  08/19/19      PT LONG TERM GOAL #3   Title  Patient reports chronic low back & left knee pain increases <2 increments on 0-10 scale with standing & gait activities.     Time  3    Period  Months    Status  On-going    Target Date  08/19/19      PT LONG TERM GOAL #4   Title  Berg Balance >45/56 to indicate lower fall risk.      Time  3    Period  Months    Status  On-going    Target Date  08/19/19      PT LONG TERM GOAL #5   Title  Functional Gait Assessment with prosthesis only >/= 15/30 to indicate lower fall risk.      Time  3    Period  Months    Status  On-going    Target Date  08/19/19      PT LONG TERM GOAL #6   Title  Patient ambulates 1000' outdoors including grass with cane or less and prosthesis modified independent to enable community mobility.     Time  3    Period  Months    Status  On-going    Target Date  08/19/19      PT LONG TERM GOAL #7   Title  Patient negotiates ramps, curbs and stairs with single rail and cane or less with prosthesis modified independent for community access.     Time  3    Period  Months    Status  On-going    Target Date  08/19/19            Plan - 08/03/19 2259    Clinical Impression Statement  Patient tolerated less activity today  due to overactivity over weekend helping friends move. Today's session focused on negotiating obstacles and negotiating ramps/curbs/stairs.    Personal Factors and Comorbidities  Comorbidity 3+;Fitness;Past/Current Experience;Time since onset of injury/illness/exacerbation    Comorbidities  TFA, HTN, Glaucoma, DM, alcohol abuse, left knee replacement, bone CA right lower leg 58yo, 2007 right rotator cuff tear. OA    Examination-Activity Limitations  Bend;Carry;Lift;Locomotion Level;Squat;Stairs;Stand;Transfers    Examination-Participation Restrictions  Community Activity;Yard Work    [employer] officer  Evolving/Moderate complexity    Rehab Potential  Good    PT Frequency  2x / week    PT Duration  12 weeks    PT Treatment/Interventions  ADLs/Self Care Home Management;DME Instruction;Gait training;Stair training;Functional mobility training;Therapeutic activities;Therapeutic exercise;Balance training;Neuromuscular re-education;Patient/family education;Prosthetic Training;Vestibular    PT Next Visit Plan  work towards LTGs without device except prosthesis    Consulted and Agree with Plan of Care  Patient       Patient will benefit from skilled therapeutic intervention in order to improve the following  deficits and impairments:  Abnormal gait, Decreased activity tolerance, Decreased balance, Decreased endurance, Decreased knowledge of use of DME, Decreased mobility, Decreased range of motion, Decreased strength, Dizziness, Impaired flexibility, Postural dysfunction, Prosthetic Dependency, Pain  Visit Diagnosis: 1. Other abnormalities of gait and mobility   2. Unsteadiness on feet   3. Abnormal posture   4. Chronic left-sided low back pain without sciatica   5. Chronic pain of left knee   6. Stiffness of right hip, not elsewhere classified        Problem List Patient Active Problem List   Diagnosis Date Noted  . Hypoglycemia 02/18/2017    Tyeisha Dinan  PT, DPT 08/03/2019, 11:01 PM  [facility] [address] [place], [place], [address] Phone: [phone]   Fax:  [phone]  Name: Isaac Morrison MRN: [id number] Date of Birth: February 12, 1961

## 2019-08-08 ENCOUNTER — Other Ambulatory Visit: Payer: Self-pay

## 2019-08-08 ENCOUNTER — Ambulatory Visit: Payer: Medicare Other | Admitting: Physical Therapy

## 2019-08-08 ENCOUNTER — Encounter: Payer: Self-pay | Admitting: Physical Therapy

## 2019-08-08 DIAGNOSIS — R293 Abnormal posture: Secondary | ICD-10-CM

## 2019-08-08 DIAGNOSIS — M6281 Muscle weakness (generalized): Secondary | ICD-10-CM

## 2019-08-08 DIAGNOSIS — R2689 Other abnormalities of gait and mobility: Secondary | ICD-10-CM | POA: Diagnosis not present

## 2019-08-08 DIAGNOSIS — R2681 Unsteadiness on feet: Secondary | ICD-10-CM

## 2019-08-08 NOTE — Therapy (Signed)
[facility] [address] [place], [place], [address] Phone: [phone]   Fax:  [phone]  Physical Therapy Treatment  Patient Details  Name: Isaac Morrison MRN: [id number] Date of Birth: Apr 21, 1961 Referring Provider (PT): Carmin Muskrat, [place]   Encounter Date: 08/08/2019  PT End of Session - 08/08/19 2303    Visit Number  13    Number of Visits  25    Date for PT Re-Evaluation  08/19/19    Authorization Type  [employer]HC Medicare    Authorization Time Period  Copays, oop, deductables & coinsurance has been waived 04/25/2019 - 09/14/2019    PT Start Time  1100    PT Stop Time  1140    PT Time Calculation (min)  40 min    Equipment Utilized During Treatment  Gait belt    Activity Tolerance  Patient tolerated treatment well    Behavior During Therapy  [facility] for tasks assessed/performed       Past Medical History:  Diagnosis Date  . Diabetes mellitus without complication ([place])   . Glaucoma   . Hyperlipidemia   . Hypertension     Past Surgical History:  Procedure Laterality Date  . REPLACEMENT TOTAL KNEE BILATERAL Bilateral   . Skin grafts    . TOTAL SHOULDER REPLACEMENT Right     There were no vitals filed for this visit.  Subjective Assessment - 08/08/19 1106    Subjective  Having new onset of pain on right posterior limb. Denies any falls. Pain started day after yesterday.    Pertinent History  TFA, HTN, Glaucoma, DM, alcohol abuse,     Limitations  Lifting;Standing;Walking;House hold activities    Patient Stated Goals  To use prosthesis to walk better in community. Get on exercise bike.     Currently in Pain?  Yes    Pain Score  5     Pain Location  Back    Pain Orientation  Lower;Left    Pain Descriptors / Indicators  Aching    Pain Type  Chronic pain    Pain Onset  More than a month ago    Multiple Pain Sites  Yes    Pain Score  3    Pain Location  Knee    Pain Orientation  Left    Pain Descriptors /  Indicators  Aching    Pain Type  Chronic pain    Pain Onset  More than a month ago    Pain Score  3    Pain Location  Leg    Pain Orientation  Right    Pain Descriptors / Indicators  Aching;Other (Comment)   feels like bone is digging into the muscle   Pain Type  Acute pain    Pain Onset  In the past 7 days    Pain Frequency  Constant        [facility] Adult PT Treatment/Exercise - 08/08/19 1113      Transfers   Transfers  Sit to Stand;Stand to Sit    Sit to Stand  5: Supervision;With upper extremity assist;With armrests;From chair/3-in-1    Stand to Sit  5: Supervision;With upper extremity assist;With armrests;To chair/3-in-1      Ambulation/Gait   Ambulation/Gait  Yes    Ambulation/Gait Assistance  5: Supervision    Ambulation/Gait Assistance Details  reminder cues to use prosthetic knee vs circumduction with knee straight.     Ambulation Distance (Feet)  250 Feet   x1, plus around gym  Assistive device  Straight cane;Prosthesis;None    Gait Pattern  Step-through pattern;Decreased arm swing - right;Decreased step length - left;Decreased stance time - right;Decreased stride length;Decreased hip/knee flexion - right;Decreased weight shift to right;Right circumduction;Right hip hike;Right flexed knee in stance;Antalgic;Lateral hip instability;Trunk flexed;Abducted- right;Poor foot clearance - right    Ambulation Surface  Level;Indoor    Stairs  Yes    Stairs Assistance  5: Supervision    Stairs Assistance Details (indicate cue type and reason)  focused on use of prosthetic knee to lower down the stairs with 2 rails progressing to single rail assistance. worked on this in [employer] with both rails and single rails.     Stair Management Technique  Two rails;One rail Right;One rail Left;Alternating pattern;Step to pattern;Forwards    Number of Stairs  4   bloc practice   Height of Stairs  6    Ramp  5: Supervision    Ramp Details (indicate cue type and reason)  cues for base of  support and to engage knee of prosthesis, moslty with desending. occasional toe scuffing noted with no balance loss. performed en bloc practice.       High Level Balance   High Level Balance Activities  Side stepping;Tandem walking    High Level Balance Comments  on red/blue mats: 3 laps each with min guard to min assist for balance, cues on form/technique. single UE support on counter needed for balance.       Neuro Re-ed    Neuro Re-ed Details   for balance/strengthening: staggered stance playing zoom ball for 2-3 minutes each foot forward. cues on posture, weight shifting to assist with balance.       Prosthetics   Current prosthetic wear tolerance (days/week)   daily    Current prosthetic wear tolerance (#hours/day)   most of awake hours, drying as needed    Residual limb condition   no issues per pt report    Donning Prosthesis  Supervision    Doffing Prosthesis  Supervision           PT Short Term Goals - 07/27/19 2219      PT SHORT TERM GOAL #1   Title  Patient reports low back pain & left knee pain increases </= 3 increments on 0-10 scale with standing & gait activities with prosthesis. (All STGs Target Date: 07/29/2019)    Baseline  07/25/19: met today with no change in back pain and knee pain increased by </=3 increments with lifting.    Status  Achieved    Target Date  07/29/19      PT SHORT TERM GOAL #2   Title  Patient tolerates prosthesis wear >14 hrs total/day without skin or limb pain issues.    Baseline  07/25/19: wearing most awake hours    Status  Achieved    Target Date  07/28/19      PT SHORT TERM GOAL #3   Title  Patient lifts 10# crate to/from floor engaging prosthesis with supervision.    Baseline  07/25/19: met today    Status  Achieved    Target Date  07/28/19      PT SHORT TERM GOAL #4   Title  Patient ambulates 500' outdoors on paved surfaces with cane & prosthesis engaging prosthetic knee >50% of steps with supervision.    Baseline  07/25/19: met  today    Status  Achieved    Target Date  07/28/19      PT SHORT TERM GOAL #  5   Title  Patient negotiates ramps, curbs with 2 cane & stairs with 1 rail/1 cane step-to with prosthesis with supervision.    Baseline  07/25/19: met today    Status  Achieved    Target Date  07/28/19      PT SHORT TERM GOAL #6   Title  Patient descends stairs with 2 rails engaging prosthesis with alternating pattern.    Time  1    Period  Months    Status  Achieved    Target Date  07/28/19        PT Long Term Goals - 07/27/19 2220      PT LONG TERM GOAL #1   Title  Patient verbalizes & demonstrates proper prosthetic care to enable safe use of prosthesis.  (All LTGs Target Date: 08/19/2019)    Time  3    Period  Months    Status  On-going    Target Date  08/19/19      PT LONG TERM GOAL #2   Title  Patient tolerates prosthesis wear >90% of awake hours without skin or limb pain issues to enable function throughout his day.     Time  3    Period  Months    Status  On-going    Target Date  08/19/19      PT LONG TERM GOAL #3   Title  Patient reports chronic low back & left knee pain increases <2 increments on 0-10 scale with standing & gait activities.     Time  3    Period  Months    Status  On-going    Target Date  08/19/19      PT LONG TERM GOAL #4   Title  Berg Balance >45/56 to indicate lower fall risk.      Time  3    Period  Months    Status  On-going    Target Date  08/19/19      PT LONG TERM GOAL #5   Title  Functional Gait Assessment with prosthesis only >/= 15/30 to indicate lower fall risk.     Time  3    Period  Months    Status  On-going    Target Date  08/19/19      PT LONG TERM GOAL #6   Title  Patient ambulates 1000' outdoors including grass with cane or less and prosthesis modified independent to enable community mobility.     Time  3    Period  Months    Status  On-going    Target Date  08/19/19      PT LONG TERM GOAL #7   Title  Patient negotiates ramps, curbs and  stairs with single rail and cane or less with prosthesis modified independent for community access.     Time  3    Period  Months    Status  On-going    Target Date  08/19/19            Plan - 08/08/19 2303    Clinical Impression Statement  Today's skilled session continued to focus on dynamic gait with prosthesis only, barriers with prosthesis only and balance reactions with no UE support. Was mindful of pt having increased pain/discomfort from overdoing it at friends house on ramp, multiple rest breaks needed through out session. The pt is making steady progress toward goals and should benefit from continued PT to progress toward unmet goals.    Personal Factors and Comorbidities  Comorbidity 3+;Fitness;Past/Current Experience;Time since onset of injury/illness/exacerbation    Comorbidities  TFA, HTN, Glaucoma, DM, alcohol abuse, left knee replacement, bone CA right lower leg 58yo, 2007 right rotator cuff tear. OA    Examination-Activity Limitations  Bend;Carry;Lift;Locomotion Level;Squat;Stairs;Stand;Transfers    Examination-Participation Restrictions  Community Activity;Yard Work    [employer] officer  Evolving/Moderate complexity    Rehab Potential  Good    PT Frequency  2x / week    PT Duration  12 weeks    PT Treatment/Interventions  ADLs/Self Care Home Management;DME Instruction;Gait training;Stair training;Functional mobility training;Therapeutic activities;Therapeutic exercise;Balance training;Neuromuscular re-education;Patient/family education;Prosthetic Training;Vestibular    PT Next Visit Plan  work towards LTGs without device except prosthesis    Consulted and Agree with Plan of Care  Patient       Patient will benefit from skilled therapeutic intervention in order to improve the following deficits and impairments:  Abnormal gait, Decreased activity tolerance, Decreased balance, Decreased endurance, Decreased knowledge of use of DME, Decreased mobility,  Decreased range of motion, Decreased strength, Dizziness, Impaired flexibility, Postural dysfunction, Prosthetic Dependency, Pain  Visit Diagnosis: Other abnormalities of gait and mobility  Unsteadiness on feet  Abnormal posture  Muscle weakness (generalized)     Problem List Patient Active Problem List   Diagnosis Date Noted  . Hypoglycemia 02/18/2017    Willow Ora, PTA, [facility] [address], [place],  [address] [phone] 08/08/19, 11:07 PM   Name: Isaac Morrison MRN: [id number] Date of Birth: 1960-12-22

## 2019-08-10 ENCOUNTER — Other Ambulatory Visit: Payer: Self-pay

## 2019-08-10 ENCOUNTER — Ambulatory Visit: Payer: Medicare Other | Admitting: Physical Therapy

## 2019-08-10 ENCOUNTER — Encounter: Payer: Self-pay | Admitting: Physical Therapy

## 2019-08-10 DIAGNOSIS — M6281 Muscle weakness (generalized): Secondary | ICD-10-CM

## 2019-08-10 DIAGNOSIS — M25562 Pain in left knee: Secondary | ICD-10-CM

## 2019-08-10 DIAGNOSIS — R293 Abnormal posture: Secondary | ICD-10-CM

## 2019-08-10 DIAGNOSIS — G8929 Other chronic pain: Secondary | ICD-10-CM

## 2019-08-10 DIAGNOSIS — M545 Low back pain, unspecified: Secondary | ICD-10-CM

## 2019-08-10 DIAGNOSIS — R2681 Unsteadiness on feet: Secondary | ICD-10-CM

## 2019-08-10 DIAGNOSIS — R2689 Other abnormalities of gait and mobility: Secondary | ICD-10-CM

## 2019-08-11 NOTE — Therapy (Signed)
[facility] [address] [place], [place], [address] Phone: [phone]   Fax:  [phone]  Physical Therapy Treatment  Patient Details  Name: Isaac Morrison MRN: [id number] Date of Birth: [birth year] Referring Provider (PT): Carmin Muskrat, [place]   Encounter Date: 08/10/2019    08/10/19 1106  PT Visits / Re-Eval  Visit Number 14  Number of Visits 25  Date for PT Re-Evaluation 08/19/19  Authorization  Authorization Type [employer] Medicare  Authorization Time Period Copays, oop, deductables & coinsurance has been waived 04/25/2019 - 09/14/2019  PT Time Calculation  PT Start Time 1101  PT Stop Time 9741 (pt checked out by front office before he left building)  PT Time Calculation (min) 42 min  PT - End of Session  Equipment Utilized During Treatment Gait belt  Activity Tolerance Patient tolerated treatment well  Behavior During Therapy [facility] for tasks assessed/performed     Past Medical History:  Diagnosis Date  . Diabetes mellitus without complication ([place])   . Glaucoma   . Hyperlipidemia   . Hypertension     Past Surgical History:  Procedure Laterality Date  . REPLACEMENT TOTAL KNEE BILATERAL Bilateral   . Skin grafts    . TOTAL SHOULDER REPLACEMENT Right     There were no vitals filed for this visit.     08/10/19 1105  Symptoms/Limitations  Subjective No new complaints. Reports pain in posterior limb in better today. No falls.  Pertinent History TFA, HTN, Glaucoma, DM, alcohol abuse,   Limitations Lifting;Standing;Walking;House hold activities  Patient Stated Goals To use prosthesis to walk better in community. Get on exercise bike.   Pain Assessment  Currently in Pain? Yes  Pain Score 6  Pain Location Back  Pain Orientation Left;Lower  2nd Pain Site  Pain Score 4  Pain Location Knee  Pain Orientation Left  Pain Descriptors / Indicators Aching  3rd Pain Site  Pain Score 3  Pain Location Leg  Pain  Orientation Right;Posterior  Pain Descriptors / Indicators Aching  Pain Type Acute pain  Pain Onset In the past 7 days  Pain Frequency Constant      08/10/19 1110  Transfers  Transfers Sit to Stand;Stand to Sit  Sit to Stand 5: Supervision;With upper extremity assist;With armrests;From chair/3-in-1  Stand to Sit 5: Supervision;With upper extremity assist;With armrests;To chair/3-in-1  Ambulation/Gait  Ambulation/Gait Yes  Ambulation/Gait Assistance 5: Supervision;4: Min guard  Ambulation/Gait Assistance Details cues to keep walking while looking/scanning for cards on walls.   Ambulation Distance (Feet) 200 Feet (plus around gym with activities)  Assistive device None;Prosthesis  Gait Pattern Step-through pattern;Decreased arm swing - right;Decreased step length - left;Decreased stance time - right;Decreased stride length;Decreased hip/knee flexion - right;Decreased weight shift to right;Right circumduction;Right hip hike;Right flexed knee in stance;Antalgic;Lateral hip instability;Trunk flexed;Abducted- right;Poor foot clearance - right  Ambulation Surface Level;Indoor  Gait Comments had pt engaged in scanning to find 10 cards scattered on walls along back hallways/registration area- 6, 8, 10 cards found respectively to laps around hallway; Treadmill with bil UE support for 5   Knee/Hip Exercises: Aerobic  Tread Mill use of treadmill to promote more reciprocal stepping with equal stance time and step length, total time of 5 minutes with speed increased from 1.5 to 1.9 mh. pt able to self adjust speed and recall how to step onto/off of treadmill without cues. min guard assist for safety. no buckling of knee on treadmill this session.   Prosthetics  Current prosthetic wear tolerance (days/week)  daily  Current prosthetic wear tolerance (#hours/day)  most of awake hours, drying as needed  Residual limb condition  no issues per pt report  Donning Prosthesis 5  Doffing Prosthesis 5      08/11/19 1915  Balance Exercises: Standing  Rockerboard Anterior/posterior;Lateral;Head turns;EC;30 seconds;10 reps  Balance Exercises: Standing  Rebounder Limitations performed both ways on balance board: holding it steady for EC no head movements, progressing to EC head movements left<>right, then up<>down. min guard to min assist for balance.        PT Short Term Goals - 07/27/19 2219      PT SHORT TERM GOAL #1   Title  Patient reports low back pain & left knee pain increases </= 3 increments on 0-10 scale with standing & gait activities with prosthesis. (All STGs Target Date: 07/29/2019)    Baseline  07/25/19: met today with no change in back pain and knee pain increased by </=3 increments with lifting.    Status  Achieved    Target Date  07/29/19      PT SHORT TERM GOAL #2   Title  Patient tolerates prosthesis wear >14 hrs total/day without skin or limb pain issues.    Baseline  07/25/19: wearing most awake hours    Status  Achieved    Target Date  07/28/19      PT SHORT TERM GOAL #3   Title  Patient lifts 10# crate to/from floor engaging prosthesis with supervision.    Baseline  07/25/19: met today    Status  Achieved    Target Date  07/28/19      PT SHORT TERM GOAL #4   Title  Patient ambulates 500' outdoors on paved surfaces with cane & prosthesis engaging prosthetic knee >50% of steps with supervision.    Baseline  07/25/19: met today    Status  Achieved    Target Date  07/28/19      PT SHORT TERM GOAL #5   Title  Patient negotiates ramps, curbs with 2 cane & stairs with 1 rail/1 cane step-to with prosthesis with supervision.    Baseline  07/25/19: met today    Status  Achieved    Target Date  07/28/19      PT SHORT TERM GOAL #6   Title  Patient descends stairs with 2 rails engaging prosthesis with alternating pattern.    Time  1    Period  Months    Status  Achieved    Target Date  07/28/19        PT Long Term Goals - 07/27/19 2220      PT LONG TERM GOAL #1    Title  Patient verbalizes & demonstrates proper prosthetic care to enable safe use of prosthesis.  (All LTGs Target Date: 08/19/2019)    Time  3    Period  Months    Status  On-going    Target Date  08/19/19      PT LONG TERM GOAL #2   Title  Patient tolerates prosthesis wear >90% of awake hours without skin or limb pain issues to enable function throughout his day.     Time  3    Period  Months    Status  On-going    Target Date  08/19/19      PT LONG TERM GOAL #3   Title  Patient reports chronic low back & left knee pain increases <2 increments on 0-10 scale with standing & gait activities.  Time  3    Period  Months    Status  On-going    Target Date  08/19/19      PT LONG TERM GOAL #4   Title  Berg Balance >45/56 to indicate lower fall risk.      Time  3    Period  Months    Status  On-going    Target Date  08/19/19      PT LONG TERM GOAL #5   Title  Functional Gait Assessment with prosthesis only >/= 15/30 to indicate lower fall risk.     Time  3    Period  Months    Status  On-going    Target Date  08/19/19      PT LONG TERM GOAL #6   Title  Patient ambulates 1000' outdoors including grass with cane or less and prosthesis modified independent to enable community mobility.     Time  3    Period  Months    Status  On-going    Target Date  08/19/19      PT LONG TERM GOAL #7   Title  Patient negotiates ramps, curbs and stairs with single rail and cane or less with prosthesis modified independent for community access.     Time  3    Period  Months    Status  On-going    Target Date  08/19/19         08/10/19 1107  Plan  Clinical Impression Statement Today's skilled session continued to focus on fluidity of gait on treadmill, dynamic gait with prosthesis only and balance reactions with no issues reported. The pt did have mild increase in chronic back/knee pain that resolved with rest breaks. The pt is making great progress toward goals and is on target to meet  LTGs on time (08/19/2019).  Personal Factors and Comorbidities Comorbidity 3+;Fitness;Past/Current Experience;Time since onset of injury/illness/exacerbation  Comorbidities TFA, HTN, Glaucoma, DM, alcohol abuse, left knee replacement, bone CA right lower leg 58yo, 2007 right rotator cuff tear. OA  Examination-Activity Limitations Bend;Carry;Lift;Locomotion Level;Squat;Stairs;Stand;Transfers  Examination-Participation Restrictions Community Activity;Yard Work  Pt will benefit from skilled therapeutic intervention in order to improve on the following deficits Abnormal gait;Decreased activity tolerance;Decreased balance;Decreased endurance;Decreased knowledge of use of DME;Decreased mobility;Decreased range of motion;Decreased strength;Dizziness;Impaired flexibility;Postural dysfunction;Prosthetic Dependency;Pain  Stability/Clinical Decision Making Evolving/Moderate complexity  Rehab Potential Good  PT Frequency 2x / week  PT Duration 12 weeks  PT Treatment/Interventions ADLs/Self Care Home Management;DME Instruction;Gait training;Stair training;Functional mobility training;Therapeutic activities;Therapeutic exercise;Balance training;Neuromuscular re-education;Patient/family education;Prosthetic Training;Vestibular  PT Next Visit Plan LTGs due next week (08/19/19)  Consulted and Agree with Plan of Care Patient          Patient will benefit from skilled therapeutic intervention in order to improve the following deficits and impairments:  Abnormal gait, Decreased activity tolerance, Decreased balance, Decreased endurance, Decreased knowledge of use of DME, Decreased mobility, Decreased range of motion, Decreased strength, Dizziness, Impaired flexibility, Postural dysfunction, Prosthetic Dependency, Pain  Visit Diagnosis: Other abnormalities of gait and mobility  Unsteadiness on feet  Abnormal posture  Muscle weakness (generalized)  Chronic left-sided low back pain without sciatica  Chronic  pain of left knee     Problem List Patient Active Problem List   Diagnosis Date Noted  . Hypoglycemia 02/18/2017    Willow Ora, PTA, [facility] [address] [place], [place] [address] [phone] 08/11/19, 7:09 PM   Name: Isaac Morrison MRN: [id number] Date of Birth: [birth year]

## 2019-08-15 ENCOUNTER — Encounter: Payer: Self-pay | Admitting: Physical Therapy

## 2019-08-15 ENCOUNTER — Ambulatory Visit: Payer: Medicare Other | Admitting: Physical Therapy

## 2019-08-15 ENCOUNTER — Other Ambulatory Visit: Payer: Self-pay

## 2019-08-15 DIAGNOSIS — R2689 Other abnormalities of gait and mobility: Secondary | ICD-10-CM | POA: Diagnosis not present

## 2019-08-15 DIAGNOSIS — M6281 Muscle weakness (generalized): Secondary | ICD-10-CM

## 2019-08-15 DIAGNOSIS — G8929 Other chronic pain: Secondary | ICD-10-CM

## 2019-08-15 DIAGNOSIS — R293 Abnormal posture: Secondary | ICD-10-CM

## 2019-08-15 DIAGNOSIS — R2681 Unsteadiness on feet: Secondary | ICD-10-CM

## 2019-08-15 DIAGNOSIS — M545 Low back pain, unspecified: Secondary | ICD-10-CM

## 2019-08-15 NOTE — Therapy (Signed)
[facility] [address] [place], [place], [address] Phone: [phone]   Fax:  [phone]  Physical Therapy Treatment  Patient Details  Name: Isaac Morrison MRN: [id number] Date of Birth: 12-02-1961 Referring Provider (PT): Carmin Muskrat, [place]   Encounter Date: 08/15/2019  PT End of Session - 08/15/19 1107    Visit Number  15    Number of Visits  25    Date for PT Re-Evaluation  08/19/19    Authorization Type  [employer]HC Medicare    Authorization Time Period  Copays, oop, deductables & coinsurance has been waived 04/25/2019 - 09/14/2019    PT Start Time  1100    PT Stop Time  1139    PT Time Calculation (min)  39 min    Equipment Utilized During Treatment  Gait belt    Activity Tolerance  Patient tolerated treatment well    Behavior During Therapy  [facility] for tasks assessed/performed       Past Medical History:  Diagnosis Date  . Diabetes mellitus without complication ([place])   . Glaucoma   . Hyperlipidemia   . Hypertension     Past Surgical History:  Procedure Laterality Date  . REPLACEMENT TOTAL KNEE BILATERAL Bilateral   . Skin grafts    . TOTAL SHOULDER REPLACEMENT Right     There were no vitals filed for this visit.  Subjective Assessment - 08/15/19 1104    Subjective  No new complaints. No falls or pain to report.    Pertinent History  TFA, HTN, Glaucoma, DM, alcohol abuse,     Limitations  Lifting;Standing;Walking;House hold activities    Patient Stated Goals  To use prosthesis to walk better in community. Get on exercise bike.     Currently in Pain?  Yes    Pain Score  5     Pain Location  Back    Pain Orientation  Left;Lower    Pain Descriptors / Indicators  Aching    Pain Type  Chronic pain    Pain Onset  More than a month ago    Pain Frequency  Constant    Aggravating Factors   increased activity    Pain Relieving Factors  medications, limiting activity    Pain Score  5    Pain Location  Knee    Pain Orientation  Left    Pain Descriptors / Indicators  Aching    Pain Type  Chronic pain    Pain Onset  More than a month ago    Pain Frequency  Constant    Aggravating Factors   increased activity    Pain Relieving Factors  medication, limiting activity         [facility] PT Assessment - 08/15/19 1109      Berg Balance Test   Sit to Stand  Able to stand without using hands and stabilize independently    Standing Unsupported  Able to stand safely 2 minutes    Sitting with Back Unsupported but Feet Supported on Floor or Stool  Able to sit safely and securely 2 minutes    Stand to Sit  Sits safely with minimal use of hands    Transfers  Able to transfer safely, minor use of hands    Standing Unsupported with Eyes Closed  Able to stand 10 seconds safely    Standing Unsupported with Feet Together  Able to place feet together independently and stand 1 minute safely    From Standing, Reach Forward with  Outstretched Arm  Can reach confidently >25 cm (10")    From Standing Position, Pick up Object from [place] to pick up shoe safely and easily    From Standing Position, Turn to Look Behind Over each Shoulder  Looks behind one side only/other side shows less weight shift   right>left   Turn 360 Degrees  Able to turn 360 degrees safely but slowly   > 5 sec's each way   Standing Unsupported, Alternately Place Feet on Step/Stool  Able to stand independently and complete 8 steps >20 seconds   27.53 sec's   Standing Unsupported, One Foot in Front  Able to plae foot ahead of the other independently and hold 30 seconds    Standing on One Leg  Able to lift leg independently and hold 5-10 seconds    Total Score  50    Berg comment:  50/56           [facility] Adult PT Treatment/Exercise - 08/15/19 1124      Transfers   Transfers  Sit to Stand;Stand to Sit    Sit to Stand  5: Supervision;With upper extremity assist;With armrests;From chair/3-in-1    Stand to Sit  5: Supervision;With upper  extremity assist;With armrests;To chair/3-in-1      Ambulation/Gait   Ambulation/Gait  Yes    Ambulation/Gait Assistance  5: Supervision    Ambulation Distance (Feet)  --   into/out of/around gym with activity   Assistive device  None;Prosthesis    Gait Pattern  Step-through pattern;Decreased arm swing - right;Decreased step length - left;Decreased stance time - right;Decreased stride length;Decreased hip/knee flexion - right;Decreased weight shift to right;Right circumduction;Right hip hike;Right flexed knee in stance;Antalgic;Lateral hip instability;Trunk flexed;Abducted- right;Poor foot clearance - right    Ambulation Surface  Level;Indoor      High Level Balance   High Level Balance Activities  Side stepping;Backward walking;Tandem walking;Other (comment)   eyes closed   High Level Balance Comments  on floor next to counter with UE support only with tandem gait, intermittent touch with other activities. cues on posture and weight shifting for 3-4 laps each. min guard assist.           Balance Exercises - 08/15/19 1124      Balance Exercises: Standing   Standing Eyes Closed  Wide (BOA);Head turns;Foam/compliant surface;Other reps (comment);30 secs;Limitations      Balance Exercises: Standing   Standing Eyes Closed Limitations  on airex in corner with chair in front for safety: EC no head movements, progressing to EC head movements left<>right, then up<>down. cues on posture to assist with balance. min guard assist for balance.            PT Short Term Goals - 07/27/19 2219      PT SHORT TERM GOAL #1   Title  Patient reports low back pain & left knee pain increases </= 3 increments on 0-10 scale with standing & gait activities with prosthesis. (All STGs Target Date: 07/29/2019)    Baseline  07/25/19: met today with no change in back pain and knee pain increased by </=3 increments with lifting.    Status  Achieved    Target Date  07/29/19      PT SHORT TERM GOAL #2   Title   Patient tolerates prosthesis wear >14 hrs total/day without skin or limb pain issues.    Baseline  07/25/19: wearing most awake hours    Status  Achieved    Target Date  07/28/19  PT SHORT TERM GOAL #3   Title  Patient lifts 10# crate to/from floor engaging prosthesis with supervision.    Baseline  07/25/19: met today    Status  Achieved    Target Date  07/28/19      PT SHORT TERM GOAL #4   Title  Patient ambulates 500' outdoors on paved surfaces with cane & prosthesis engaging prosthetic knee >50% of steps with supervision.    Baseline  07/25/19: met today    Status  Achieved    Target Date  07/28/19      PT SHORT TERM GOAL #5   Title  Patient negotiates ramps, curbs with 2 cane & stairs with 1 rail/1 cane step-to with prosthesis with supervision.    Baseline  07/25/19: met today    Status  Achieved    Target Date  07/28/19      PT SHORT TERM GOAL #6   Title  Patient descends stairs with 2 rails engaging prosthesis with alternating pattern.    Time  1    Period  Months    Status  Achieved    Target Date  07/28/19        PT Long Term Goals - 08/15/19 1107      PT LONG TERM GOAL #1   Title  Patient verbalizes & demonstrates proper prosthetic care to enable safe use of prosthesis.  (All LTGs Target Date: 08/19/2019)    Baseline  08/15/19: met today    Status  Achieved      PT LONG TERM GOAL #2   Title  Patient tolerates prosthesis wear >90% of awake hours without skin or limb pain issues to enable function throughout his day.     Baseline  08/15/19: met today    Status  Achieved      PT LONG TERM GOAL #3   Title  Patient reports chronic low back & left knee pain increases <2 increments on 0-10 scale with standing & gait activities.     Baseline  08/15/19: met today in session    Time  --    Period  --    Status  Achieved      PT LONG TERM GOAL #4   Title  Berg Balance >45/56 to indicate lower fall risk.      Baseline  08/15/19: 50/56 scored today    Time  --    Period   --    Status  Achieved      PT LONG TERM GOAL #5   Title  Functional Gait Assessment with prosthesis only >/= 15/30 to indicate lower fall risk.     Time  3    Period  Months    Status  On-going      PT LONG TERM GOAL #6   Title  Patient ambulates 1000' outdoors including grass with cane or less and prosthesis modified independent to enable community mobility.     Time  3    Period  Months    Status  On-going      PT LONG TERM GOAL #7   Title  Patient negotiates ramps, curbs and stairs with single rail and cane or less with prosthesis modified independent for community access.     Time  3    Period  Months    Status  On-going            Plan - 08/15/19 1107    Clinical Impression Statement  Today's skilled session continued to address  high level balance reactions and began to address LTGs. Pt has met 3 of 3 LTGs checked today.    Personal Factors and Comorbidities  Comorbidity 3+;Fitness;Past/Current Experience;Time since onset of injury/illness/exacerbation    Comorbidities  TFA, HTN, Glaucoma, DM, alcohol abuse, left knee replacement, bone CA right lower leg 58yo, 2007 right rotator cuff tear. OA    Examination-Activity Limitations  Bend;Carry;Lift;Locomotion Level;Squat;Stairs;Stand;Transfers    Examination-Participation Restrictions  Community Activity;Yard Work    [employer]  Evolving/Moderate complexity    Rehab Potential  Good    PT Frequency  2x / week    PT Duration  12 weeks    PT Treatment/Interventions  ADLs/Self Care Home Management;DME Instruction;Gait training;Stair training;Functional mobility training;Therapeutic activities;Therapeutic exercise;Balance training;Neuromuscular re-education;Patient/family education;Prosthetic Training;Vestibular    PT Next Visit Plan  Assess remaining LTGs for anticipated dicharge at next visit.    Consulted and Agree with Plan of Care  Patient       Patient will benefit from skilled therapeutic  intervention in order to improve the following deficits and impairments:  Abnormal gait, Decreased activity tolerance, Decreased balance, Decreased endurance, Decreased knowledge of use of DME, Decreased mobility, Decreased range of motion, Decreased strength, Dizziness, Impaired flexibility, Postural dysfunction, Prosthetic Dependency, Pain  Visit Diagnosis: Other abnormalities of gait and mobility  Unsteadiness on feet  Abnormal posture  Muscle weakness (generalized)  Chronic left-sided low back pain without sciatica  Chronic pain of left knee     Problem List Patient Active Problem List   Diagnosis Date Noted  . Hypoglycemia 02/18/2017    Willow Ora, PTA, [facility] Outpatient Neuro [facility] [address], [place], [place] [address] [phone] 08/15/19, 4:51 PM   Name: Tamel Abel MRN: [id number] Date of Birth: 11/10/1961

## 2019-08-17 ENCOUNTER — Ambulatory Visit: Payer: Medicare Other | Attending: Physical Medicine & Rehabilitation | Admitting: Physical Therapy

## 2019-08-17 ENCOUNTER — Encounter: Payer: Self-pay | Admitting: Physical Therapy

## 2019-08-17 ENCOUNTER — Other Ambulatory Visit: Payer: Self-pay

## 2019-08-17 DIAGNOSIS — G8929 Other chronic pain: Secondary | ICD-10-CM | POA: Insufficient documentation

## 2019-08-17 DIAGNOSIS — R2681 Unsteadiness on feet: Secondary | ICD-10-CM | POA: Diagnosis not present

## 2019-08-17 DIAGNOSIS — M25562 Pain in left knee: Secondary | ICD-10-CM | POA: Insufficient documentation

## 2019-08-17 DIAGNOSIS — M6281 Muscle weakness (generalized): Secondary | ICD-10-CM | POA: Diagnosis present

## 2019-08-17 DIAGNOSIS — M545 Low back pain, unspecified: Secondary | ICD-10-CM

## 2019-08-17 DIAGNOSIS — R293 Abnormal posture: Secondary | ICD-10-CM | POA: Diagnosis present

## 2019-08-17 DIAGNOSIS — R2689 Other abnormalities of gait and mobility: Secondary | ICD-10-CM

## 2019-08-17 NOTE — Therapy (Signed)
[facility] [address] [place], [place], [address] Phone: [phone]   Fax:  [phone]  Physical Therapy Treatment & Discharge Summary  Patient Details  Name: Isaac Morrison MRN: [id number] Date of Birth: 12/21/1960 Referring Provider (PT): Bryan Kozak, PA   Encounter Date: 08/17/2019   CLINIC OPERATION CHANGES: Outpatient Neuro Rehab is open at lower capacity following universal masking, social distancing, and patient screening.  The patient's COVID risk of complications score is 1.  PHYSICAL THERAPY DISCHARGE SUMMARY  Visits from Start of Care: 2  Current functional level related to goals / functional outcomes: See below   Remaining deficits: See below   Education / Equipment: Prosthetic care, pain management, HEP Plan: Patient agrees to discharge.  Patient goals were met. Patient is being discharged due to meeting the stated rehab goals.  ?????         PT End of Session - 08/17/19 1143    Visit Number  16    Number of Visits  25    Date for PT Re-Evaluation  08/19/19    Authorization Type  [employer] Medicare    Authorization Time Period  Copays, oop, deductables & coinsurance has been waived 04/25/2019 - 09/14/2019    PT Start Time  1100    PT Stop Time  1140    PT Time Calculation (min)  40 min    Activity Tolerance  Patient tolerated treatment well    Behavior During Therapy  WFL for tasks assessed/performed       Past Medical History:  Diagnosis Date  . Diabetes mellitus without complication (HCC)   . Glaucoma   . Hyperlipidemia   . Hypertension     Past Surgical History:  Procedure Laterality Date  . REPLACEMENT TOTAL KNEE BILATERAL Bilateral   . Skin grafts    . TOTAL SHOULDER REPLACEMENT Right     There were no vitals filed for this visit.  Subjective Assessment - 08/17/19 1100    Subjective  No falls. He feels that PT has helped improved his mobility.    Pertinent History  TFA,  HTN, Glaucoma, DM, alcohol abuse,     Limitations  Lifting;Standing;Walking;House hold activities    Patient Stated Goals  To use prosthesis to walk better in community. Get on exercise bike.     Currently in Pain?  Yes    Pain Score  5    stand & walking stays same now   Pain Location  Back    Pain Orientation  Lower    Pain Descriptors / Indicators  Aching    Pain Type  Chronic pain    Pain Onset  More than a month ago    Pain Frequency  Constant    Pain Score  5   increases to 6/10 with standing & walking   Pain Location  Knee    Pain Orientation  Left    Pain Descriptors / Indicators  Aching    Pain Type  Chronic pain    Pain Onset  More than a month ago    Pain Frequency  Constant    Aggravating Factors   arthritis         [facility] PT Assessment - 08/17/19 1100      Assessment   Medical Diagnosis  Left Transfemoral Amputation    Referring Provider (PT)  Bryan Kozak, PA    Onset Date/Surgical Date  04/21/19    Hand Dominance  Right      Transfers     Transfers  Sit to Stand;Stand to Sit    Sit to Stand  7: Independent;Without upper extremity assist;From chair/3-in-1    Stand to Sit  7: Independent;Without upper extremity assist;To chair/3-in-1      Ambulation/Gait   Ambulation/Gait  Yes    Ambulation/Gait Assistance  6: Modified independent (Device/Increase time)    Ambulation Distance (Feet)  1000 Feet   50' on grass   Assistive device  None;Prosthesis    Gait Pattern  Step-through pattern;Decreased arm swing - right;Decreased stance time - right    Ambulation Surface  Indoor;Level;Outdoor;Paved;Grass    Gait velocity  2.87 ft/sec comfortble & 3.51 ft/sec fast pace   Initial Gait Velocity  1.71 ft/sec   Stairs  Yes    Stairs Assistance  6: Modified independent (Device/Increase time)    Stair Management Technique  One rail Right;One rail Left;Step to pattern;Alternating pattern;Forwards   alternating descend & step-to ascend limited by knee design   Number of Stairs   4   2 reps, reports negotiating 19 steps with left rail   Height of Stairs  6    Ramp  6: Modified independent (Device)   prosthesis only   Curb  6: Modified independent (Device/increase time)   prosthesis only     Berg Balance Test   Sit to Stand  Able to stand without using hands and stabilize independently    Standing Unsupported  Able to stand safely 2 minutes    Sitting with Back Unsupported but Feet Supported on Floor or Stool  Able to sit safely and securely 2 minutes    Stand to Sit  Sits safely with minimal use of hands    Transfers  Able to transfer safely, minor use of hands    Standing Unsupported with Eyes Closed  Able to stand 10 seconds safely    Standing Unsupported with Feet Together  Able to place feet together independently and stand 1 minute safely    From Standing, Reach Forward with Outstretched Arm  Can reach confidently >25 cm (10")    From Standing Position, Pick up Object from Floor  Able to pick up shoe safely and easily    From Standing Position, Turn to Look Behind Over each Shoulder  Looks behind one side only/other side shows less weight shift    Turn 360 Degrees  Able to turn 360 degrees safely but slowly    Standing Unsupported, Alternately Place Feet on Step/Stool  Able to stand independently and complete 8 steps >20 seconds    Standing Unsupported, One Foot in Front  Able to plae foot ahead of the other independently and hold 30 seconds    Standing on One Leg  Able to lift leg independently and hold 5-10 seconds    Total Score  50    Berg comment:  Initial 23/56      Functional Gait  Assessment   Gait assessed   Yes    Gait Level Surface  Walks 20 ft in less than 7 sec but greater than 5.5 sec, uses assistive device, slower speed, mild gait deviations, or deviates 6-10 in outside of the 12 in walkway width.    Change in Gait Speed  Able to smoothly change walking speed without loss of balance or gait deviation. Deviate no more than 6 in outside of the  12 in walkway width.    Gait with Horizontal Head Turns  Performs head turns smoothly with slight change in gait velocity (eg, minor disruption to smooth gait   path), deviates 6-10 in outside 12 in walkway width, or uses an assistive device.    Gait with Vertical Head Turns  Performs task with slight change in gait velocity (eg, minor disruption to smooth gait path), deviates 6 - 10 in outside 12 in walkway width or uses assistive device    Gait and Pivot Turn  Pivot turns safely within 3 sec and stops quickly with no loss of balance.    Step Over Obstacle  Is able to step over one shoe box (4.5 in total height) without changing gait speed. No evidence of imbalance.    Gait with Narrow Base of Support  Ambulates less than 4 steps heel to toe or cannot perform without assistance.    Gait with Eyes Closed  Walks 20 ft, slow speed, abnormal gait pattern, evidence for imbalance, deviates 10-15 in outside 12 in walkway width. Requires more than 9 sec to ambulate 20 ft.    Ambulating Backwards  Walks 20 ft, uses assistive device, slower speed, mild gait deviations, deviates 6-10 in outside 12 in walkway width.    Steps  Alternating feet, must use rail.   descends alternating, prosthetic knee will not ascend   Total Score  19    FGA comment:  Initial FGA 1/30      Prosthetics Assessment - 08/17/19 1100      Prosthetics   Prosthetic Care Independent with  Skin check;Residual limb care;Care of non-amputated limb;Prosthetic cleaning;Ply sock cleaning;Correct ply sock adjustment;Proper wear schedule/adjustment;Proper weight-bearing schedule/adjustment    Donning prosthesis   Independent    Doffing prosthesis   Independent    Current prosthetic wear tolerance (days/week)   daily    Current prosthetic wear tolerance (#hours/day)   most of awake hours, drying as needed    Current prosthetic weight-bearing tolerance (hours/day)   Pt tolerates standing 30 minutes with intermittent gait with no increase in back  pain, 1 increment increase in left knee pain & no residual limb pain.     Edema  none    Residual limb condition   no issues per pt report    K code/activity level with prosthetic use   K3 potential for full community with variable cadence,  Transfemoral Microprocessor Prosthesis: Orion3 knee, ischial containment socket, silicon liner with shuttle pin lock                            PT Short Term Goals - 07/27/19 2219      PT SHORT TERM GOAL #1   Title  Patient reports low back pain & left knee pain increases </= 3 increments on 0-10 scale with standing & gait activities with prosthesis. (All STGs Target Date: 07/29/2019)    Baseline  07/25/19: met today with no change in back pain and knee pain increased by </=3 increments with lifting.    Status  Achieved    Target Date  07/29/19      PT SHORT TERM GOAL #2   Title  Patient tolerates prosthesis wear >14 hrs total/day without skin or limb pain issues.    Baseline  07/25/19: wearing most awake hours    Status  Achieved    Target Date  07/28/19      PT SHORT TERM GOAL #3   Title  Patient lifts 10# crate to/from floor engaging prosthesis with supervision.    Baseline  07/25/19: met today    Status  Achieved    Target Date  07/28/19      PT SHORT TERM GOAL #4   Title  Patient ambulates 500' outdoors on paved surfaces with cane & prosthesis engaging prosthetic knee >50% of steps with supervision.    Baseline  07/25/19: met today    Status  Achieved    Target Date  07/28/19      PT SHORT TERM GOAL #5   Title  Patient negotiates ramps, curbs with 2 cane & stairs with 1 rail/1 cane step-to with prosthesis with supervision.    Baseline  07/25/19: met today    Status  Achieved    Target Date  07/28/19      PT SHORT TERM GOAL #6   Title  Patient descends stairs with 2 rails engaging prosthesis with alternating pattern.    Time  1    Period  Months    Status  Achieved    Target Date  07/28/19        PT Long Term  Goals - 08/17/19 2102      PT LONG TERM GOAL #1   Title  Patient verbalizes & demonstrates proper prosthetic care to enable safe use of prosthesis.  (All LTGs Target Date: 08/19/2019)    Baseline  08/15/19 MET    Status  Achieved      PT LONG TERM GOAL #2   Title  Patient tolerates prosthesis wear >90% of awake hours without skin or limb pain issues to enable function throughout his day.     Baseline  08/15/19 MET    Status  Achieved      PT LONG TERM GOAL #3   Title  Patient reports chronic low back & left knee pain increases <2 increments on 0-10 scale with standing & gait activities.     Baseline  MET 08/17/2019    Status  Achieved      PT LONG TERM GOAL #4   Title  Berg Balance >45/56 to indicate lower fall risk.      Baseline  08/15/19: Berg improved to 50/56 from 23/56    Status  Achieved      PT LONG TERM GOAL #5   Title  Functional Gait Assessment with prosthesis only >/= 15/30 to indicate lower fall risk.     Baseline  MET 08/17/2019  FGA improved to 19/30 from 1/30    Time  3    Period  Months    Status  Achieved      PT LONG TERM GOAL #6   Title  Patient ambulates 1000' outdoors including grass with cane or less and prosthesis modified independent to enable community mobility.     Baseline  MET 08/17/2019 with prosthesis only    Time  3    Period  Months    Status  Achieved      PT LONG TERM GOAL #7   Title  Patient negotiates ramps, curbs and stairs with single rail and cane or less with prosthesis modified independent for community access.     Baseline  MET 08/17/2019 with prosthesis only. Stairs modified technique without rail & alternates descending using prosthetic hydraulics alternate pattern    Time  3    Period  Months    Status  Achieved            Plan - 08/17/19 2108    Clinical Impression Statement  Patient met all LTGs set at evaluation.  He improved his Berg Balance score to 50/56 from 23/56 indicate low fall risk with standing  ADLs.  He improved  Functional Gait Assessment to 19/30 from 1/30 indicating lower fall with gait. His chronic low back & left knee pain from arthritis only increases minimally with community based activities. He can function with prosthesis only but some times uses a cane for pain management.    Personal Factors and Comorbidities  Comorbidity 3+;Fitness;Past/Current Experience;Time since onset of injury/illness/exacerbation    Comorbidities  TFA, HTN, Glaucoma, DM, alcohol abuse, left knee replacement, bone CA right lower leg 58yo, 2007 right rotator cuff tear. OA    Examination-Activity Limitations  Bend;Carry;Lift;Locomotion Level;Squat;Stairs;Stand;Transfers    Examination-Participation Restrictions  Community Activity;Yard Work    [employer]  Evolving/Moderate complexity    Rehab Potential  Good    PT Frequency  2x / week    PT Duration  12 weeks    PT Treatment/Interventions  ADLs/Self Care Home Management;DME Instruction;Gait training;Stair training;Functional mobility training;Therapeutic activities;Therapeutic exercise;Balance training;Neuromuscular re-education;Patient/family education;Prosthetic Training;Vestibular    PT Next Visit Plan  discharge    Consulted and Agree with Plan of Care  Patient       Patient will benefit from skilled therapeutic intervention in order to improve the following deficits and impairments:  Abnormal gait, Decreased activity tolerance, Decreased balance, Decreased endurance, Decreased knowledge of use of DME, Decreased mobility, Decreased range of motion, Decreased strength, Dizziness, Impaired flexibility, Postural dysfunction, Prosthetic Dependency, Pain  Visit Diagnosis: Unsteadiness on feet  Other abnormalities of gait and mobility  Abnormal posture  Muscle weakness (generalized)  Chronic left-sided low back pain without sciatica  Chronic pain of left knee     Problem List Patient Active Problem List   Diagnosis Date Noted  .  Hypoglycemia 02/18/2017    Ardena Gangl PT, DPT 08/17/2019, 9:13 PM  [facility] [address] [place], [place], [address] Phone: [phone]   Fax:  [phone]  Name: Isaac Morrison MRN: [id number] Date of Birth: [birth year]

## 2021-04-12 ENCOUNTER — Inpatient Hospital Stay
Admission: EM | Admit: 2021-04-12 | Discharge: 2021-04-20 | DRG: 640 | Disposition: A | Discharge status: Home / Self Care | Payer: Medicare Other | Attending: Internal Medicine | Admitting: Internal Medicine

## 2021-04-12 ENCOUNTER — Emergency Department: Payer: Medicare Other

## 2021-04-12 ENCOUNTER — Inpatient Hospital Stay: Payer: Medicare Other

## 2021-04-12 ENCOUNTER — Other Ambulatory Visit: Payer: Self-pay

## 2021-04-12 ENCOUNTER — Encounter: Payer: Self-pay | Admitting: Medical Oncology

## 2021-04-12 DIAGNOSIS — B379 Candidiasis, unspecified: Secondary | ICD-10-CM | POA: Diagnosis present

## 2021-04-12 DIAGNOSIS — E1151 Type 2 diabetes mellitus with diabetic peripheral angiopathy without gangrene: Secondary | ICD-10-CM | POA: Diagnosis present

## 2021-04-12 DIAGNOSIS — D696 Thrombocytopenia, unspecified: Secondary | ICD-10-CM | POA: Diagnosis present

## 2021-04-12 DIAGNOSIS — E1165 Type 2 diabetes mellitus with hyperglycemia: Secondary | ICD-10-CM | POA: Diagnosis present

## 2021-04-12 DIAGNOSIS — Y900 Blood alcohol level of less than 20 mg/100 ml: Secondary | ICD-10-CM | POA: Diagnosis present

## 2021-04-12 DIAGNOSIS — Z96611 Presence of right artificial shoulder joint: Secondary | ICD-10-CM | POA: Diagnosis present

## 2021-04-12 DIAGNOSIS — F10139 Alcohol abuse with withdrawal, unspecified: Secondary | ICD-10-CM | POA: Diagnosis present

## 2021-04-12 DIAGNOSIS — R410 Disorientation, unspecified: Secondary | ICD-10-CM | POA: Diagnosis present

## 2021-04-12 DIAGNOSIS — Z794 Long term (current) use of insulin: Secondary | ICD-10-CM | POA: Diagnosis not present

## 2021-04-12 DIAGNOSIS — N179 Acute kidney failure, unspecified: Secondary | ICD-10-CM | POA: Diagnosis present

## 2021-04-12 DIAGNOSIS — F10231 Alcohol dependence with withdrawal delirium: Secondary | ICD-10-CM

## 2021-04-12 DIAGNOSIS — E860 Dehydration: Secondary | ICD-10-CM | POA: Diagnosis present

## 2021-04-12 DIAGNOSIS — Z820 Family history of epilepsy and other diseases of the nervous system: Secondary | ICD-10-CM | POA: Diagnosis not present

## 2021-04-12 DIAGNOSIS — F10931 Alcohol use, unspecified with withdrawal delirium: Secondary | ICD-10-CM

## 2021-04-12 DIAGNOSIS — K219 Gastro-esophageal reflux disease without esophagitis: Secondary | ICD-10-CM | POA: Diagnosis present

## 2021-04-12 DIAGNOSIS — E870 Hyperosmolality and hypernatremia: Secondary | ICD-10-CM

## 2021-04-12 DIAGNOSIS — E872 Acidosis: Secondary | ICD-10-CM | POA: Diagnosis present

## 2021-04-12 DIAGNOSIS — R441 Visual hallucinations: Secondary | ICD-10-CM | POA: Diagnosis present

## 2021-04-12 DIAGNOSIS — I161 Hypertensive emergency: Secondary | ICD-10-CM | POA: Diagnosis present

## 2021-04-12 DIAGNOSIS — E785 Hyperlipidemia, unspecified: Secondary | ICD-10-CM | POA: Diagnosis present

## 2021-04-12 DIAGNOSIS — E876 Hypokalemia: Secondary | ICD-10-CM | POA: Diagnosis present

## 2021-04-12 DIAGNOSIS — Z89611 Acquired absence of right leg above knee: Secondary | ICD-10-CM

## 2021-04-12 DIAGNOSIS — Z20822 Contact with and (suspected) exposure to covid-19: Secondary | ICD-10-CM | POA: Diagnosis present

## 2021-04-12 DIAGNOSIS — G928 Other toxic encephalopathy: Secondary | ICD-10-CM | POA: Diagnosis present

## 2021-04-12 DIAGNOSIS — G312 Degeneration of nervous system due to alcohol: Secondary | ICD-10-CM | POA: Diagnosis present

## 2021-04-12 DIAGNOSIS — Z96653 Presence of artificial knee joint, bilateral: Secondary | ICD-10-CM | POA: Diagnosis present

## 2021-04-12 DIAGNOSIS — Z85830 Personal history of malignant neoplasm of bone: Secondary | ICD-10-CM

## 2021-04-12 DIAGNOSIS — E512 Wernicke's encephalopathy: Secondary | ICD-10-CM

## 2021-04-12 DIAGNOSIS — I10 Essential (primary) hypertension: Secondary | ICD-10-CM | POA: Diagnosis present

## 2021-04-12 DIAGNOSIS — E875 Hyperkalemia: Secondary | ICD-10-CM | POA: Diagnosis present

## 2021-04-12 DIAGNOSIS — Z79899 Other long term (current) drug therapy: Secondary | ICD-10-CM

## 2021-04-12 DIAGNOSIS — R2681 Unsteadiness on feet: Secondary | ICD-10-CM | POA: Diagnosis present

## 2021-04-12 LAB — URINE DRUG SCREEN, QUALITATIVE (ARMC ONLY)
Amphetamines, Ur Screen: NOT DETECTED
Barbiturates, Ur Screen: NOT DETECTED
Benzodiazepine, Ur Scrn: NOT DETECTED
Cannabinoid 50 Ng, Ur ~~LOC~~: NOT DETECTED
Cocaine Metabolite,Ur ~~LOC~~: NOT DETECTED
MDMA (Ecstasy)Ur Screen: NOT DETECTED
Methadone Scn, Ur: NOT DETECTED
Opiate, Ur Screen: POSITIVE — AB
Phencyclidine (PCP) Ur S: NOT DETECTED
Tricyclic, Ur Screen: POSITIVE — AB

## 2021-04-12 LAB — COMPREHENSIVE METABOLIC PANEL
ALT: 17 U/L (ref 0–44)
AST: 10 U/L — ABNORMAL LOW (ref 15–41)
Albumin: 3.8 g/dL (ref 3.5–5.0)
Alkaline Phosphatase: 77 U/L (ref 38–126)
Anion gap: 34 — ABNORMAL HIGH (ref 5–15)
BUN: 215 mg/dL — ABNORMAL HIGH (ref 6–20)
CO2: 12 mmol/L — ABNORMAL LOW (ref 22–32)
Calcium: 9.1 mg/dL (ref 8.9–10.3)
Chloride: 91 mmol/L — ABNORMAL LOW (ref 98–111)
Creatinine, Ser: 16.14 mg/dL — ABNORMAL HIGH (ref 0.61–1.24)
GFR, Estimated: 3 mL/min — ABNORMAL LOW (ref >60)
Glucose, Bld: 176 mg/dL — ABNORMAL HIGH (ref 70–99)
Potassium: 7.4 mmol/L (ref 3.5–5.1)
Sodium: 137 mmol/L (ref 135–145)
Total Bilirubin: 1.5 mg/dL — ABNORMAL HIGH (ref 0.3–1.2)
Total Protein: 7.5 g/dL (ref 6.5–8.1)

## 2021-04-12 LAB — URINALYSIS, COMPLETE (UACMP) WITH MICROSCOPIC
Bacteria, UA: NONE SEEN
Bilirubin Urine: NEGATIVE
Glucose, UA: 50 mg/dL — AB
Ketones, ur: 5 mg/dL — AB
Leukocytes,Ua: NEGATIVE
Nitrite: NEGATIVE
Protein, ur: NEGATIVE mg/dL
Specific Gravity, Urine: 1.018 (ref 1.005–1.030)
pH: 5 (ref 5.0–8.0)

## 2021-04-12 LAB — CBC WITH DIFFERENTIAL/PLATELET
Abs Immature Granulocytes: 0.04 10*3/uL (ref 0.00–0.07)
Basophils Absolute: 0 10*3/uL (ref 0.0–0.1)
Basophils Relative: 0 %
Eosinophils Absolute: 0 10*3/uL (ref 0.0–0.5)
Eosinophils Relative: 0 %
HCT: 42 % (ref 39.0–52.0)
Hemoglobin: 15.1 g/dL (ref 13.0–17.0)
Immature Granulocytes: 0 %
Lymphocytes Relative: 4 %
Lymphs Abs: 0.4 10*3/uL — ABNORMAL LOW (ref 0.7–4.0)
MCH: 32.1 pg (ref 26.0–34.0)
MCHC: 36 g/dL (ref 30.0–36.0)
MCV: 89.2 fL (ref 80.0–100.0)
Monocytes Absolute: 0.5 10*3/uL (ref 0.1–1.0)
Monocytes Relative: 4 %
Neutro Abs: 10.7 10*3/uL — ABNORMAL HIGH (ref 1.7–7.7)
Neutrophils Relative %: 92 %
Platelets: 207 10*3/uL (ref 150–400)
RBC: 4.71 MIL/uL (ref 4.22–5.81)
RDW: 13.2 % (ref 11.5–15.5)
WBC: 11.7 10*3/uL — ABNORMAL HIGH (ref 4.0–10.5)
nRBC: 0 % (ref 0.0–0.2)

## 2021-04-12 LAB — PROTEIN / CREATININE RATIO, URINE
Creatinine, Urine: 316 mg/dL
Protein Creatinine Ratio: 0.32 mg/mg{Cre} — ABNORMAL HIGH (ref 0.00–0.15)
Total Protein, Urine: 100 mg/dL

## 2021-04-12 LAB — HIV ANTIBODY (ROUTINE TESTING W REFLEX): HIV Screen 4th Generation wRfx: NONREACTIVE

## 2021-04-12 LAB — LACTIC ACID, PLASMA: Lactic Acid, Venous: 1.4 mmol/L (ref 0.5–1.9)

## 2021-04-12 LAB — ACETAMINOPHEN LEVEL: Acetaminophen (Tylenol), Serum: <10 ug/mL — ABNORMAL LOW (ref 10–30)

## 2021-04-12 LAB — RESP PANEL BY RT-PCR (FLU A&B, COVID) ARPGX2
Influenza A by PCR: NEGATIVE
Influenza B by PCR: NEGATIVE
SARS Coronavirus 2 by RT PCR: NEGATIVE

## 2021-04-12 LAB — HEPATITIS B CORE ANTIBODY, TOTAL: Hep B Core Total Ab: NONREACTIVE

## 2021-04-12 LAB — HEPATITIS B SURFACE ANTIGEN: Hepatitis B Surface Ag: NONREACTIVE

## 2021-04-12 LAB — APTT: aPTT: 32 seconds (ref 24–36)

## 2021-04-12 LAB — ETHANOL: Alcohol, Ethyl (B): <10 mg/dL (ref <10)

## 2021-04-12 LAB — FERRITIN: Ferritin: 176 ng/mL (ref 24–336)

## 2021-04-12 LAB — CK: Total CK: 588 U/L — ABNORMAL HIGH (ref 49–397)

## 2021-04-12 LAB — BETA-HYDROXYBUTYRIC ACID: Beta-Hydroxybutyric Acid: 2.6 mmol/L — ABNORMAL HIGH (ref 0.05–0.27)

## 2021-04-12 LAB — PROTIME-INR
INR: 1.2 (ref 0.8–1.2)
Prothrombin Time: 15.4 seconds — ABNORMAL HIGH (ref 11.4–15.2)

## 2021-04-12 LAB — MAGNESIUM: Magnesium: 2.9 mg/dL — ABNORMAL HIGH (ref 1.7–2.4)

## 2021-04-12 LAB — GLUCOSE, CAPILLARY: Glucose-Capillary: 151 mg/dL — ABNORMAL HIGH (ref 70–99)

## 2021-04-12 LAB — PHOSPHORUS: Phosphorus: 16.4 mg/dL — ABNORMAL HIGH (ref 2.5–4.6)

## 2021-04-12 LAB — SALICYLATE LEVEL: Salicylate Lvl: <7 mg/dL — ABNORMAL LOW (ref 7.0–30.0)

## 2021-04-12 MED ORDER — CHLORHEXIDINE GLUCONATE CLOTH 2 % EX PADS
6.0000 | MEDICATED_PAD | Freq: Every day | CUTANEOUS | Status: DC
  Administered 2021-04-13 – 2021-04-17 (×4): 6 via TOPICAL
  Filled 2021-04-12: qty 6

## 2021-04-12 MED ORDER — DOCUSATE SODIUM 100 MG PO CAPS
100.0000 mg | ORAL_CAPSULE | Freq: Two times a day (BID) | ORAL | Status: DC | PRN
  Administered 2021-04-16: 21:00:00 100 mg via ORAL
  Filled 2021-04-12: qty 1

## 2021-04-12 MED ORDER — HEPARIN SODIUM (PORCINE) 1000 UNIT/ML IJ SOLN
1000.0000 [IU] | Freq: Once | INTRAMUSCULAR | Status: AC
  Administered 2021-04-12: 1000 [IU] via INTRAVENOUS

## 2021-04-12 MED ORDER — LACTATED RINGERS IV SOLN: INTRAVENOUS | Status: DC

## 2021-04-12 MED ORDER — SODIUM CHLORIDE 0.9 % IV SOLN
500.0000 mg | INTRAVENOUS | Status: DC
  Administered 2021-04-13: 500 mg via INTRAVENOUS
  Filled 2021-04-12 (×3): qty 500

## 2021-04-12 MED ORDER — SODIUM BICARBONATE 8.4 % IV SOLN
50.0000 meq | Freq: Once | INTRAVENOUS | Status: AC
  Administered 2021-04-12: 50 meq via INTRAVENOUS
  Filled 2021-04-12: qty 50

## 2021-04-12 MED ORDER — FENTANYL CITRATE (PF) 100 MCG/2ML IJ SOLN
50.0000 ug | Freq: Once | INTRAMUSCULAR | Status: AC
Start: 2021-04-12 — End: 2021-04-12
  Administered 2021-04-12: 50 ug via INTRAVENOUS
  Filled 2021-04-12: qty 2

## 2021-04-12 MED ORDER — HALOPERIDOL LACTATE 5 MG/ML IJ SOLN
5.0000 mg | Freq: Once | INTRAMUSCULAR | Status: AC
  Administered 2021-04-12: 5 mg via INTRAMUSCULAR
  Filled 2021-04-12: qty 1

## 2021-04-12 MED ORDER — SODIUM CHLORIDE 0.9 % IV BOLUS (SEPSIS): 1000.0000 mL | Freq: Once | INTRAVENOUS | Status: DC

## 2021-04-12 MED ORDER — NOREPINEPHRINE 4 MG/250ML-% IV SOLN: 2.0000 ug/min | INTRAVENOUS | Status: DC

## 2021-04-12 MED ORDER — HEPARIN SODIUM (PORCINE) 1000 UNIT/ML DIALYSIS
1000.0000 [IU] | Freq: Two times a day (BID) | INTRAMUSCULAR | Status: DC | PRN
  Administered 2021-04-13: 2800 [IU] via INTRAVENOUS
  Filled 2021-04-12 (×2): qty 6

## 2021-04-12 MED ORDER — SODIUM BICARBONATE 8.4 % IV SOLN
INTRAVENOUS | Status: AC
  Filled 2021-04-12 (×2): qty 1000

## 2021-04-12 MED ORDER — CALCIUM GLUCONATE 10 % IV SOLN
1.0000 g | Freq: Once | INTRAVENOUS | Status: AC
  Administered 2021-04-12: 1 g via INTRAVENOUS
  Filled 2021-04-12: qty 10

## 2021-04-12 MED ORDER — HALOPERIDOL LACTATE 5 MG/ML IJ SOLN: 5.0000 mg | Freq: Once | INTRAMUSCULAR | Status: DC

## 2021-04-12 MED ORDER — NICOTINE 14 MG/24HR TD PT24
14.0000 mg | MEDICATED_PATCH | Freq: Every day | TRANSDERMAL | Status: DC
  Administered 2021-04-13 – 2021-04-20 (×8): 14 mg via TRANSDERMAL
  Filled 2021-04-12 (×8): qty 1

## 2021-04-12 MED ORDER — SODIUM CHLORIDE 0.9 % IV BOLUS (SEPSIS)
500.0000 mL | Freq: Once | INTRAVENOUS | Status: AC
  Administered 2021-04-12: 500 mL via INTRAVENOUS

## 2021-04-12 MED ORDER — LORAZEPAM 2 MG/ML IJ SOLN
2.0000 mg | Freq: Once | INTRAMUSCULAR | Status: AC
  Administered 2021-04-12: 2 mg via INTRAVENOUS
  Filled 2021-04-12: qty 1

## 2021-04-12 MED ORDER — SODIUM CHLORIDE 0.9 % IV SOLN
INTRAVENOUS | Status: AC | PRN
  Administered 2021-04-12: 250 mL via INTRAVENOUS

## 2021-04-12 MED ORDER — DEXMEDETOMIDINE HCL IN NACL 400 MCG/100ML IV SOLN
0.4000 ug/kg/h | INTRAVENOUS | Status: DC
  Administered 2021-04-12: 0.5 ug/kg/h via INTRAVENOUS
  Filled 2021-04-12: qty 100

## 2021-04-12 MED ORDER — NOREPINEPHRINE 4 MG/250ML-% IV SOLN
INTRAVENOUS | Status: AC
  Administered 2021-04-12: 2 ug/min via INTRAVENOUS
  Filled 2021-04-12: qty 250

## 2021-04-12 MED ORDER — SODIUM CHLORIDE 0.9 % IV BOLUS (SEPSIS)
1000.0000 mL | Freq: Once | INTRAVENOUS | Status: AC
  Administered 2021-04-12: 1000 mL via INTRAVENOUS

## 2021-04-12 MED ORDER — INSULIN ASPART 100 UNIT/ML IV SOLN
10.0000 [IU] | Freq: Once | INTRAVENOUS | Status: AC
  Administered 2021-04-12: 10 [IU] via INTRAVENOUS
  Filled 2021-04-12: qty 0.1

## 2021-04-12 MED ORDER — NOREPINEPHRINE 16 MG/250ML-% IV SOLN
0.0000 ug/min | INTRAVENOUS | Status: DC
  Administered 2021-04-12 – 2021-04-13 (×2): 2 ug/min via INTRAVENOUS
  Filled 2021-04-12: qty 250

## 2021-04-12 MED ORDER — SODIUM ZIRCONIUM CYCLOSILICATE 5 G PO PACK
10.0000 g | PACK | ORAL | Status: DC
  Filled 2021-04-12: qty 1

## 2021-04-12 MED ORDER — DEXTROSE 50 % IV SOLN
2.0000 | Freq: Once | INTRAVENOUS | Status: AC
  Administered 2021-04-12: 100 mL via INTRAVENOUS
  Filled 2021-04-12: qty 100

## 2021-04-12 MED ORDER — POLYETHYLENE GLYCOL 3350 17 G PO PACK: 17.0000 g | PACK | Freq: Every day | ORAL | Status: DC | PRN

## 2021-04-12 MED ORDER — SODIUM CHLORIDE 0.9 % IV SOLN
2.0000 g | INTRAVENOUS | Status: DC
  Administered 2021-04-12 – 2021-04-13 (×2): 2 g via INTRAVENOUS
  Filled 2021-04-12: qty 20
  Filled 2021-04-12: qty 2
  Filled 2021-04-12: qty 20

## 2021-04-12 MED ORDER — SODIUM CHLORIDE 0.9 % IV SOLN
250.0000 mL | INTRAVENOUS | Status: DC
  Administered 2021-04-12 – 2021-04-13 (×2): 250 mL via INTRAVENOUS

## 2021-04-12 NOTE — Consult Note (Signed)
[facility] Consult Note: 04/12/2021    Date of Admission:  04/12/2021           Reason for Consult:  AKI, Hyperkalemia   Referring Provider: Sharman Cheek, MD Primary Care Provider: Dan Maker, MD   History of Presenting Illness:  Isaac Morrison is a 60 y.o. male with peripheral vascular disease, right AKA, diabetes, hypertension and hyperlipidemia. He was brought in by his brother for decreased appetite and confusion He has history of chronic alcohol use and told the ER that he drinks about sixpack beer daily.  At this time he says that he does not drink very much but does not quantify Labs in the ER showed potassium of 7.4, creatinine of 16 and BUN of 2015 Nephrology consult has now been requested for evaluation Review of care everywhere records show that in March 2022, his hemoglobin was 16.5, creatinine of 0.64, hemoglobin A1c 6.2%, HDL 55, LDL 123.3, urinalysis was negative for blood, urine microalbumin to creatinine ratio 47.3  Patient denies any fevers, chills, cough, hemoptysis, history of nonsteroidal use, diarrhea, constipation, hematuria, hematemesis, denies lower extremity edema.  Review of Systems: Gen: Denies any fevers or chills HEENT: No vision or hearing problems CV: No chest pain or shortness of breath Resp: No cough or sputum production GI: No nausea, vomiting or diarrhea.  No blood in the stool GU : No problems with voiding.  No hematuria.  No previous history of kidney problems MS: Ambulatory.  Denies any acute joint pain or swelling Derm:   No complaints Psych: No complaints Heme: No complaints Neuro: No complaints Endocrine: No complaints    Past Medical History:  Diagnosis Date  . Diabetes mellitus without complication (HCC)   . Glaucoma   . Hyperlipidemia   . Hypertension     Social History   Tobacco Use  . Smoking status: Never Smoker  . Smokeless tobacco: Never Used  Substance Use Topics  . Alcohol use: Yes     Alcohol/week: 49.0 standard drinks    Types: 49 Cans of beer per week  . Drug use: No    Family History  Problem Relation Age of Onset  . Alzheimer's disease Father      OBJECTIVE: Blood pressure 105/63, pulse 100, temperature (!) 97.5 F (36.4 C), temperature source Oral, resp. rate 18, height 5\' 8"  (1.727 m), weight 81.6 kg, SpO2 98 %.  Physical Exam: General:  No acute distress, laying in the bed, disheveled  HEENT  anicteric, moist oral mucous membrane  Pulm/lungs  normal breathing effort, lungs are clear to auscultation  CVS/Heart  regular rhythm, no rub or gallop  Abdomen:   Soft, nontender  Extremities:  No peripheral edema, right AKA  Neurologic:  Alert, oriented, able to follow commands  Skin:  No acute rashes     Lab Results Lab Results  Component Value Date   WBC 11.7 (H) 04/12/2021   HGB 15.1 04/12/2021   HCT 42.0 04/12/2021   MCV 89.2 04/12/2021   PLT 207 04/12/2021    Lab Results  Component Value Date   CREATININE 16.14 (H) 04/12/2021   BUN 215 (H) 04/12/2021   NA 137 04/12/2021   K 7.4 (HH) 04/12/2021   CL 91 (L) 04/12/2021   CO2 12 (L) 04/12/2021    Lab Results  Component Value Date   ALT 17 04/12/2021   AST 10 (L) 04/12/2021   ALKPHOS 77 04/12/2021   BILITOT 1.5 (H) 04/12/2021     Microbiology: No  results found for this or any previous visit (from the past 240 hour(s)).  Medications: Scheduled Meds: . calcium gluconate  1 g Intravenous Once  . insulin aspart  10 Units Intravenous Once   And  . dextrose  2 ampule Intravenous Once  . haloperidol lactate  5 mg Intravenous Once  . sodium bicarbonate  50 mEq Intravenous Once  . sodium zirconium cyclosilicate  10 g Oral STAT   Continuous Infusions: . azithromycin    . cefTRIAXone (ROCEPHIN)  IV 2 g (04/12/21 1631)  . sodium bicarbonate 150 mEq in D5W infusion    . sodium chloride     And  . sodium chloride     PRN Meds:.  No Known Allergies  Urinalysis: No results for  input(s): COLORURINE, LABSPEC, PHURINE, GLUCOSEU, HGBUR, BILIRUBINUR, KETONESUR, PROTEINUR, UROBILINOGEN, NITRITE, LEUKOCYTESUR in the last 72 hours.  Invalid input(s): APPERANCEUR    Imaging: DG Chest Port 1 View  Result Date: 04/12/2021 CLINICAL DATA:  Sepsis EXAM: PORTABLE CHEST 1 VIEW COMPARISON:  02/18/2017 FINDINGS: The heart size and mediastinal contours are within normal limits. Both lungs are clear. The visualized skeletal structures are unremarkable. IMPRESSION: No active disease. Electronically Signed   By: Elige Ko   On: 04/12/2021 17:31      Assessment/Plan:  Isaac Morrison is a 60 y.o. male with medical problems of peripheral vascular disease, right AKA, diabetes, hypertension, hyperlipidemia, alcohol abuse  was admitted on 04/12/2021 for :  dizziness  #Acute kidney injury #Severe hyperkalemia. #Metabolic acidosis  -Baseline creatinine of 0.64 from February 11, 2021 Urinalysis and renal imaging are not available Patient reports confusion, decreased appetite, not eating or drinking which may be due to uremic symptoms. EKG shows peaked T Waves.  Discussed severity of renal disease, hyperkalemia and also offered that he may need urgent hemodialysis to correct uremia and electrolytes.  Patient has agreed to proceed.  Patient will be admitted and monitored in ICU.  We will request ICU for dialysis catheter and place urgent hemodialysis orders.  Dialysis nurse has been alerted -Obtain u/a,urine p/c ratio, screening seriologies R/o rhabdomyolysis  Discussed with emergency room physician to institute shifting measures for hyperkalemia until dialysis can be arranged.   Brendaly Townsel Thedore Mins 04/12/21

## 2021-04-12 NOTE — ED Notes (Signed)
Pt moved to RM 24. Refusing to be hooked up to monitors or fluids. Refusing all care

## 2021-04-12 NOTE — ED Notes (Signed)
Dr notified of potassium being 7.4; pt is also stating "something is not right" and refusing antibiotics

## 2021-04-12 NOTE — ED Notes (Signed)
Sitting 1:1 with pt at this time.

## 2021-04-12 NOTE — ED Notes (Signed)
Pt attempting to get out of bed, This RN to bedside, pt states he needs to get up and go to restroom but cannot find his leg. Pt assisted to sit back in stretcher and provided urinal, pt continuing to refuse medications and other care.

## 2021-04-12 NOTE — ED Triage Notes (Signed)
Pt from home via ems, lives with brother who reports pt has been having poor appetite and not up walking around for a couple of days. Per pt he has a Rt AKA and uses a prosthesis. Pt A/O 4. Noted that pt has some shaking to arms, pt reports that he drinks approx a 6 pack of beer a day. Last drink was last night.

## 2021-04-12 NOTE — Consult Note (Signed)
PHARMACY CONSULT NOTE - FOLLOW UP  Pharmacy Consult for Electrolyte Monitoring and Replacement   Recent Labs: Potassium (mmol/L)  Date Value  04/12/2021 7.4 (HH)   Magnesium (mg/dL)  Date Value  84/13/2440 2.2   Calcium (mg/dL)  Date Value  10/11/2535 9.1   Albumin (g/dL)  Date Value  64/40/3474 3.8   Sodium (mmol/L)  Date Value  04/12/2021 137     Assessment: Patient w/ severe AKI and admitted to the medical intensive care unit 4/29 for requirement of emergent dialysis. Patient with severe hyperkalemia ( K 7.9), acodisos (CO2 12) and uremia (BUN 215)  Patient was seen by nephrology and plan for HD overnight  Goal of Therapy:  Electrolytes WNL   Plan:   Lokelma 10 gram x 1 and Bicarb infusion ordered by provider   Agree with emergency HD  Follow up with AM labs following HD session   Sharen Hones ,PharmD, BCPS Clinical Pharmacist 04/12/2021 7:26 PM

## 2021-04-12 NOTE — Procedures (Addendum)
Central Venous Catheter Insertion Procedure Note  Isaac Morrison  [id number]  26-Feb-1961  Date:04/12/21  Time:9:08 PM   Provider Performing:Elizabeth A Ouma   Procedure: Insertion of Non-tunneled Central Venous Catheter(36556)with US guidance (29476)    Indication(s) Hemodialysis  Consent Risks of the procedure as well as the alternatives and risks of each were explained to the patient and/or caregiver.  Consent for the procedure was obtained and is signed in the bedside chart  Anesthesia Topical only with 1% lidocaine   Timeout Verified patient identification, verified procedure, site/side was marked, verified correct patient position, special equipment/implants available, medications/allergies/relevant history reviewed, required imaging and test results available.  Sterile Technique Maximal sterile technique including full sterile barrier drape, hand hygiene, sterile gown, sterile gloves, mask, hair covering, sterile ultrasound probe cover (if used).  Procedure Description Area of catheter insertion was cleaned with chlorhexidine and draped in sterile fashion.   With real-time ultrasound guidance a HD catheter was placed into the right internal jugular vein.  Nonpulsatile blood flow and easy flushing noted in all ports.  The catheter was sutured in place and sterile dressing applied.  Complications/Tolerance None; patient tolerated the procedure well. Chest X-ray is ordered to verify placement for internal jugular or subclavian cannulation.  Chest x-ray is not ordered for femoral cannulation.  EBL Minimal  Specimen(s) None  Catheter placed 17 cm mark Biopatch placed   Isaac Silversmith, DNP, CCRN, FNP-C, AGACNP-BC Acute Care Nurse Practitioner  [facility] Pager: [phone] [place] [facility]   >> CXR reading places HD catheter at the junction of the right atrium, pulled back to 16 cm. Isaac Morrison, AGACNP-BC Acute Care  Nurse Practitioner [facility]   [phone] / [phone] Please see Amion for pager details.

## 2021-04-12 NOTE — Consult Note (Signed)
CODE SEPSIS - PHARMACY COMMUNICATION  **Broad Spectrum Antibiotics should be administered within 1 hour of Sepsis diagnosis**  Time Code Sepsis Called/Page Received: 1612  Antibiotics Ordered: ceftriaxone + azithromycin  Time of 1st antibiotic administration: 1631    Ronnald Ramp ,PharmD Clinical Pharmacist  04/12/2021  4:51 PM

## 2021-04-12 NOTE — ED Notes (Signed)
Pt refusing COVID swab at this time, stating "something ain't right". Pt appears to be hallucinating, continuously refusing COVID swab

## 2021-04-12 NOTE — Progress Notes (Incomplete)
Elink monitoring for sepsis protol.

## 2021-04-12 NOTE — H&P (Signed)
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  He has a history of chronic alcoholism, lifelong smoking history.  Brother of patient was present and was able to provide additional details including cocaine history.  Patient denies seizures in the past.  During interview patient is able to answer questions and is able to tell me that he is having visual and auditory hallucinations which are new for him and have lasted approximately 3 to 4 days.  Brother of patient states that he has been with decreased mentation and altered sensorium in the past 1 week.  Patient has severe AKI and is admitted to the medical intensive care unit for requirement of emergent dialysis.  Patient was seen by nephrology and plan for HD overnight.  Lines/tubes :   Microbiology/Sepsis markers: Results for orders placed or performed during the hospital encounter of 02/18/17  CULTURE, BLOOD (ROUTINE X 2) w Reflex to ID Panel     Status: None   Collection Time: 02/18/17  6:13 AM   Specimen: BLOOD  Result Value Ref Range Status   Specimen Description BLOOD L AC  Final   Special Requests BOTTLES DRAWN AEROBIC AND ANAEROBIC BCAV  Final   Culture NO GROWTH 5 DAYS  Final   Report Status 02/23/2017 FINAL  Final  CULTURE, BLOOD (ROUTINE X 2) w Reflex to ID Panel     Status: None   Collection Time: 02/18/17  6:13 AM   Specimen: BLOOD  Result Value Ref Range Status   Specimen Description BLOOD R AC  Final   Special  Requests BOTTLES DRAWN AEROBIC AND ANAEROBIC BCAV  Final   Culture NO GROWTH 5 DAYS  Final   Report Status 02/23/2017 FINAL  Final  Aerobic/Anaerobic Culture (surgical/deep wound)     Status: None   Collection Time: 02/18/17 11:31 AM   Specimen: Leg; Wound  Result Value Ref Range Status   Specimen Description LEG RIGHT LOWER LEG  Final   Special Requests NONE  Final   Gram Stain   Final    ABUNDANT WBC PRESENT,BOTH PMN AND MONONUCLEAR ABUNDANT GRAM VARIABLE ROD ABUNDANT GRAM POSITIVE COCCI IN PAIRS    Culture   Final    MODERATE STAPHYLOCOCCUS AUREUS WITHIN MIXED CULTURE Performed at [facility], [address], [place], [place] [address]    Report Status 02/23/2017 FINAL  Final   Organism ID, Bacteria STAPHYLOCOCCUS AUREUS  Final      Susceptibility   Staphylococcus aureus - MIC*    CIPROFLOXACIN >=8 RESISTANT Resistant     ERYTHROMYCIN RESISTANT Resistant     GENTAMICIN <=0.5 SENSITIVE Sensitive     OXACILLIN 0.5 SENSITIVE Sensitive     TETRACYCLINE <=1 SENSITIVE Sensitive     VANCOMYCIN 1 SENSITIVE Sensitive     TRIMETH/SULFA <=10 SENSITIVE Sensitive     CLINDAMYCIN <=0.25 SENSITIVE Sensitive     RIFAMPIN <=0.5 SENSITIVE Sensitive     Inducible Clindamycin NEGATIVE Sensitive     * MODERATE STAPHYLOCOCCUS AUREUS    Anti-infectives:  Anti-infectives (From admission, onward)   Start     Dose/Rate Route Frequency Ordered Stop   04/12/21 1615  cefTRIAXone (ROCEPHIN) 2 g  in sodium chloride 0.9 % 100 mL IVPB        2 g 200 mL/hr over 30 Minutes Intravenous Every 24 hours 04/12/21 1611     04/12/21 1615  azithromycin (ZITHROMAX) 500 mg in sodium chloride 0.9 % 250 mL IVPB        500 mg 250 mL/hr over 60 Minutes Intravenous Every 24 hours 04/12/21 1611          PAST MEDICAL HISTORY   Past Medical History:  Diagnosis Date  . Diabetes mellitus without complication (HCC)   . Glaucoma   . Hyperlipidemia   . Hypertension      SURGICAL HISTORY   Past Surgical  History:  Procedure Laterality Date  . REPLACEMENT TOTAL KNEE BILATERAL Bilateral   . Skin grafts    . TOTAL SHOULDER REPLACEMENT Right      FAMILY HISTORY   Family History  Problem Relation Age of Onset  . Alzheimer's disease Father      SOCIAL HISTORY   Social History   Tobacco Use  . Smoking status: Never Smoker  . Smokeless tobacco: Never Used  Substance Use Topics  . Alcohol use: Yes    Alcohol/week: 49.0 standard drinks    Types: 49 Cans of beer per week  . Drug use: No     MEDICATIONS   Current Medication:  Current Facility-Administered Medications:  .  azithromycin (ZITHROMAX) 500 mg in sodium chloride 0.9 % 250 mL IVPB, 500 mg, Intravenous, Q24H, Sharman Cheek, MD .  calcium gluconate inj 10% (1 g) URGENT USE ONLY!, 1 g, Intravenous, Once, Sharman Cheek, MD .  cefTRIAXone (ROCEPHIN) 2 g in sodium chloride 0.9 % 100 mL IVPB, 2 g, Intravenous, Q24H, Sharman Cheek, MD, Last Rate: 200 mL/hr at 04/12/21 1631, 2 g at 04/12/21 1631 .  [START ON 04/13/2021] Chlorhexidine Gluconate Cloth 2 % PADS 6 each, 6 each, Topical, Q0600, Singh, Harmeet, MD .  insulin aspart (novoLOG) injection 10 Units, 10 Units, Intravenous, Once **AND** dextrose 50 % solution 100 mL, 2 ampule, Intravenous, Once, Sharman Cheek, MD .  docusate sodium (COLACE) capsule 100 mg, 100 mg, Oral, BID PRN, Vida Rigger, MD .  haloperidol lactate (HALDOL) injection 5 mg, 5 mg, Intravenous, Once, Sharman Cheek, MD .  polyethylene glycol (MIRALAX / GLYCOLAX) packet 17 g, 17 g, Oral, Daily PRN, Karna Christmas, Mechele Kittleson, MD .  sodium bicarbonate 150 mEq in dextrose 5 % 1,150 mL infusion, , Intravenous, Continuous, Sharman Cheek, MD .  sodium bicarbonate injection 50 mEq, 50 mEq, Intravenous, Once, Sharman Cheek, MD .  [address] sodium chloride 0.9 % bolus 1,000 mL, 1,000 mL, Intravenous, Once, Last Rate: 2,000 mL/hr at 04/12/21 1637, 1,000 mL at 04/12/21 1637 **AND** sodium chloride 0.9 %  bolus 1,000 mL, 1,000 mL, Intravenous, Once **AND** sodium chloride 0.9 % bolus 500 mL, 500 mL, Intravenous, Once, Sharman Cheek, MD .  sodium zirconium cyclosilicate (LOKELMA) packet 10 g, 10 g, Oral, STAT, Sharman Cheek, MD  Current Outpatient Medications:  .  benazepril (LOTENSIN) 20 MG tablet, Take 20 mg by mouth daily., Disp: , Rfl:  .  collagenase (SANTYL) ointment, Apply topically daily. (Patient not taking: Reported on 05/24/2019), Disp: 15 g, Rfl: 0 .  dicloxacillin (DYNAPEN) 500 MG capsule, Take 1 capsule (500 mg total) by mouth every 8 (eight) hours. (Patient not taking: Reported on 05/24/2019), Disp: 15 capsule, Rfl: 0 .  dorzolamide-timolol (COSOPT) 22.3-6.8 MG/ML ophthalmic solution, Place 1 drop into both eyes 2 (two) times daily., Disp: , Rfl: 3 .  feeding supplement, ENSURE ENLIVE, (ENSURE ENLIVE) LIQD, Take 237 mLs by mouth 2 (two) times daily with a meal. (Patient not taking: Reported on 05/24/2019), Disp: 60 Bottle, Rfl: 0 .  fenofibrate (TRICOR) 145 MG tablet, Take 145 mg by mouth daily., Disp: , Rfl:  .  insulin aspart (NOVOLOG) 100 UNIT/ML injection, 4 units subcutaneous injection prior to meals. (food must be in front of you) (Patient not taking: Reported on 04/12/2021), Disp: 10 mL, Rfl: 0 .  morphine (MSIR) 30 MG tablet, Take 1 tablet (30 mg total) by mouth 2 (two) times daily as needed. (Patient not taking: Reported on 05/24/2019), Disp: 30 tablet, Rfl: 0 .  Omega-3 Fatty Acids (FISH OIL) 1000 MG CAPS, Take 2,000 mg by mouth 4 (four) times daily., Disp: , Rfl:  .  omeprazole (PRILOSEC) 20 MG capsule, Take 20 mg by mouth daily., Disp: , Rfl: 2 .  pravastatin (PRAVACHOL) 80 MG tablet, Take 80 mg by mouth at bedtime., Disp: , Rfl: 2 .  tadalafil (CIALIS) 20 MG tablet, Take 20 mg by mouth daily as needed., Disp: , Rfl:  .  thiamine 100 MG tablet, Take 1 tablet (100 mg total) by mouth daily. (Patient not taking: Reported on 05/24/2019), Disp: 30 tablet, Rfl: 0    ALLERGIES    Patient has no known allergies.    REVIEW OF SYSTEMS     10 point ROS conducted exam except for hallucinations both auditory and visual.  PHYSICAL EXAMINATION   Vital Signs: Temp:  [97.5 F (36.4 C)] 97.5 F (36.4 C) (04/29 1608) Pulse Rate:  [100-106] 100 (04/29 1700) Resp:  [18] 18 (04/29 1700) BP: (78-105)/(39-63) 105/63 (04/29 1700) SpO2:  [94 %-98 %] 98 % (04/29 1700) Weight:  [81.6 kg] 81.6 kg (04/29 1609)  GENERAL: Older than stated age, no apparent distress HEAD: Normocephalic, atraumatic.  EYES: Pupils equal, round, reactive to light.  No scleral icterus.  Positive conjunctivitis  MOUTH: Moist mucosal membrane. NECK: Supple. No thyromegaly. No nodules. No JVD.  PULMONARY: Clear to auscultation with mild rhonchorous breath sounds bilaterally CARDIOVASCULAR: S1 and S2. Regular rate and rhythm. No murmurs, rubs, or gallops.  GASTROINTESTINAL: Soft, nontender, non-distended. No masses. Positive bowel sounds. No hepatosplenomegaly.  MUSCULOSKELETAL: No swelling, clubbing, or edema.  NEUROLOGIC: Altered sensorium with no focal deficit SKIN:intact,warm,dry   PERTINENT DATA     Infusions: . azithromycin    . cefTRIAXone (ROCEPHIN)  IV 2 g (04/12/21 1631)  . sodium bicarbonate 150 mEq in D5W infusion    . sodium chloride     And  . sodium chloride     Scheduled Medications: . calcium gluconate  1 g Intravenous Once  . [START ON 04/13/2021] Chlorhexidine Gluconate Cloth  6 each Topical Q0600  . insulin aspart  10 Units Intravenous Once   And  . dextrose  2 ampule Intravenous Once  . haloperidol lactate  5 mg Intravenous Once  . sodium bicarbonate  50 mEq Intravenous Once  . sodium zirconium cyclosilicate  10 g Oral STAT   PRN Medications: docusate sodium, polyethylene glycol Hemodynamic parameters:   Intake/Output: No intake/output data recorded.  Ventilator  Settings:    LAB RESULTS:  Basic Metabolic Panel: Recent Labs  Lab 04/12/21 1559  NA  137  K 7.4*  CL 91*  CO2 12*  GLUCOSE 176*  BUN 215*  CREATININE 16.14*  CALCIUM 9.1   Liver Function Tests: Recent Labs  Lab 04/12/21 1559  AST 10*  ALT 17  ALKPHOS 77  BILITOT  1.5*  PROT 7.5  ALBUMIN 3.8   No results for input(s): LIPASE, AMYLASE in the last 168 hours. No results for input(s): AMMONIA in the last 168 hours. CBC: Recent Labs  Lab 04/12/21 1559  WBC 11.7*  NEUTROABS 10.7*  HGB 15.1  HCT 42.0  MCV 89.2  PLT 207   Cardiac Enzymes: Recent Labs  Lab 04/12/21 1745  CKTOTAL 588*   BNP: Invalid input(s): POCBNP CBG: No results for input(s): GLUCAP in the last 168 hours.     IMAGING RESULTS:  Imaging: DG Chest Port 1 View  Result Date: 04/12/2021 CLINICAL DATA:  Sepsis EXAM: PORTABLE CHEST 1 VIEW COMPARISON:  02/18/2017 FINDINGS: The heart size and mediastinal contours are within normal limits. Both lungs are clear. The visualized skeletal structures are unremarkable. IMPRESSION: No active disease. Electronically Signed   By: Elige KoHetal  Patel   On: 04/12/2021 17:31   @[email or web address] @ DG Chest Port 1 View  Result Date: 04/12/2021 CLINICAL DATA:  Sepsis EXAM: PORTABLE CHEST 1 VIEW COMPARISON:  02/18/2017 FINDINGS: The heart size and mediastinal contours are within normal limits. Both lungs are clear. The visualized skeletal structures are unremarkable. IMPRESSION: No active disease. Electronically Signed   By: Elige KoHetal  Patel   On: 04/12/2021 17:31          ASSESSMENT AND PLAN    -Multidisciplinary rounds held today   Renal Failure-AKI stage V -Requiring emergent dialysis -follow UO -continue Foley Catheter-assess need daily -Urine drug screen -Nephrology team on case-plan for placement of triple-lumen dialysis catheter and emergent hemodialysis overnight  Altered mental status with confusion -Likely due to toxic metabolic encephalopathy secondary to severe AKI -Patient continues to deteriorate with compromise of airway may need  intubation   High anion gap metabolic acidosis   -Patient is not with hyperglycemia, suspect metabolic derangements and lactic acidosis   -Currently receiving sodium bicarb infusion and planning for emergent dialysis   Severe hyperkalemia -Repeat twelve-lead EKG -peaked T waves and PR prolongation noted - deliver 1amp calcium gloconate -emergent HD -Lokelma ordered  -Renal team on case appreciate input -pharmacy consultation   Chronic alcoholism - CIWA protocol -monitor for alcohol withdrawal syndrome -Ativan PRN ordered   Polysubstance abuse -Cocaine & tobacco history  -monitor for withdrawal -urine drug screen ordered -nicotine patch 14mg    Hx of right lower extermity osteosarcoma  - s/p AKA at age 60  -fall precautions while with altered sensorium  - may need 1:1 sitter  ID -continue IV abx as prescibed -follow up cultures  GI/Nutrition GI PROPHYLAXIS as indicated DIET-->TF's as tolerated Constipation protocol as indicated  ENDO - ICU hypoglycemic\Hyperglycemia protocol -check FSBS per protocol    DVT/GI PRX ordered -SCDs  TRANSFUSIONS AS NEEDED MONITOR FSBS ASSESS the need for LABS as needed   Critical care provider statement:    Critical care time (minutes):  109   Critical care time was exclusive of:  Separately billable procedures and treating other patients   Critical care was necessary to treat or prevent imminent or life-threatening deterioration of the following conditions:  AKI stage5, hx of osteosarcoma, alcoholism, polysubstance abuse, multiple comorbid conditions   Critical care was time spent personally by me on the following activities:  Development of treatment plan with patient or surrogate, discussions with consultants, evaluation of patient's response to treatment, examination of patient, obtaining history from patient or surrogate, ordering and performing treatments and interventions, ordering and review of laboratory studies and  re-evaluation of patient's condition.  I assumed direction of critical care  for this patient from another provider in my specialty: no    This document was prepared using Dragon voice recognition software and may include unintentional dictation errors.    Vida Rigger, M.D.  Division of Pulmonary & Critical Care Medicine  [facility]

## 2021-04-12 NOTE — ED Provider Notes (Signed)
[facility] Emergency Department Provider Note  ____________________________________________  Time seen: Approximately 5:43 PM  I have reviewed the triage vital signs and the nursing notes.   HISTORY  Chief Complaint Weakness and Dizziness    Level 5 Caveat: Portions of the History and Physical including HPI and review of systems are unable to be completely obtained due to patient being a poor historian   HPI Isaac Morrison is a 60 y.o. male with a history of hypertension hyperlipidemia and diabetes who is brought to the ED due to generalized weakness and confusion.  Additional history obtained from brother who lives with the patient.  States that the patient is a daily drinker, at least a sixpack a day.  He has not had any alcohol to drink for the last 10 days, and the last 3 days has not had any food or fluid intake due to feeling ill.  Patient denies any pain, no chest pain or abdominal pain.  Denies vomiting or diarrhea.      Past Medical History:  Diagnosis Date  . Diabetes mellitus without complication (HCC)   . Glaucoma   . Hyperlipidemia   . Hypertension      Patient Active Problem List   Diagnosis Date Noted  . Hypoglycemia 02/18/2017     Past Surgical History:  Procedure Laterality Date  . REPLACEMENT TOTAL KNEE BILATERAL Bilateral   . Skin grafts    . TOTAL SHOULDER REPLACEMENT Right      Prior to Admission medications   Medication Sig Start Date End Date Taking? Authorizing Provider  benazepril (LOTENSIN) 20 MG tablet Take 20 mg by mouth daily. 04/08/13   [provider]  collagenase (SANTYL) ointment Apply topically daily. Patient not taking: Reported on 05/24/2019 02/20/17   Alford Highland, MD  dicloxacillin (DYNAPEN) 500 MG capsule Take 1 capsule (500 mg total) by mouth every 8 (eight) hours. Patient not taking: Reported on 05/24/2019 02/20/17   Alford Highland, MD  dorzolamide-timolol (COSOPT) 22.3-6.8 MG/ML ophthalmic  solution Place 1 drop into both eyes 2 (two) times daily. 01/16/17   [provider]  feeding supplement, ENSURE ENLIVE, (ENSURE ENLIVE) LIQD Take 237 mLs by mouth 2 (two) times daily with a meal. Patient not taking: Reported on 05/24/2019 02/20/17   Alford Highland, MD  fenofibrate (TRICOR) 145 MG tablet Take 145 mg by mouth daily.    [provider]  insulin aspart (NOVOLOG) 100 UNIT/ML injection 4 units subcutaneous injection prior to meals. (food must be in front of you) 02/20/17   Alford Highland, MD  morphine (MSIR) 30 MG tablet Take 1 tablet (30 mg total) by mouth 2 (two) times daily as needed. Patient not taking: Reported on 05/24/2019 02/20/17   Alford Highland, MD  Omega-3 Fatty Acids (FISH OIL) 1000 MG CAPS Take 2,000 mg by mouth 4 (four) times daily.    [provider]  omeprazole (PRILOSEC) 20 MG capsule Take 20 mg by mouth daily. 12/12/16   [provider]  pravastatin (PRAVACHOL) 80 MG tablet Take 80 mg by mouth at bedtime. 12/04/16   [provider]  tadalafil (CIALIS) 20 MG tablet Take 20 mg by mouth daily as needed. 09/08/13   [provider]  thiamine 100 MG tablet Take 1 tablet (100 mg total) by mouth daily. Patient not taking: Reported on 05/24/2019 02/20/17   Alford Highland, MD     Allergies Patient has no known allergies.   Family History  Problem Relation Age of Onset  . Alzheimer's  disease Father     Social History Social History   Tobacco Use  . Smoking status: Never Smoker  . Smokeless tobacco: Never Used  Substance Use Topics  . Alcohol use: Yes    Alcohol/week: 49.0 standard drinks    Types: 49 Cans of beer per week  . Drug use: No    Review of Systems Level 5 Caveat: Portions of the History and Physical including HPI and review of systems are unable to be completely obtained due to patient being a poor historian   Constitutional:   No known fever.  ENT:   No rhinorrhea. Cardiovascular:   No chest pain  or syncope. Respiratory:   No dyspnea or cough. Gastrointestinal:   Negative for abdominal pain, vomiting and diarrhea.  Musculoskeletal:   Negative for focal pain or swelling ____________________________________________   PHYSICAL EXAM:  VITAL SIGNS: ED Triage Vitals  Enc Vitals Group     BP 04/12/21 1608 (!) 78/39     Pulse Rate 04/12/21 1608 (!) 104     Resp 04/12/21 1608 18     Temp 04/12/21 1608 (!) 97.5 F (36.4 C)     Temp Source 04/12/21 1608 Oral     SpO2 04/12/21 1608 94 %     Weight 04/12/21 1609 180 lb (81.6 kg)     Height 04/12/21 1609 5\' 8"  (1.727 m)     Head Circumference --      Peak Flow --      Pain Score 04/12/21 1609 7     Pain Loc --      Pain Edu? --      Excl. in GC? --     Vital signs reviewed, nursing assessments reviewed.   Constitutional:   Alert and oriented to self.  Ill-appearing Eyes:   Conjunctivae are normal. EOMI. PERRL. ENT      Head:   Normocephalic and atraumatic.      Nose:   No congestion/rhinnorhea.       Mouth/Throat:   Dry mucous membranes, no pharyngeal erythema. No peritonsillar mass.       Neck:   No meningismus. Full ROM. Hematological/Lymphatic/Immunilogical:   No cervical lymphadenopathy. Cardiovascular:   Tachycardia heart rate 110. Symmetric bilateral radial and DP pulses.  No murmurs. Cap refill less than 2 seconds. Respiratory:   Normal respiratory effort without tachypnea/retractions. Breath sounds are clear and equal bilaterally. No wheezes/rales/rhonchi. Gastrointestinal:   Soft and nontender. Non distended. There is no CVA tenderness.  No rebound, rigidity, or guarding.  Musculoskeletal: Status post right AKA.  Normal range of motion in all extremities. No joint effusions.  No lower extremity tenderness.  No edema. Neurologic:   Normal speech, language is complicated by loose associations and at times incoherent thought process Patient is disoriented, unable to comprehend the nature of his current illness and  situation. Motor grossly intact.  Skin:    Skin is warm, dry and intact. No rash noted.  No petechiae, purpura, or bullae.  ____________________________________________    LABS (pertinent positives/negatives) (all labs ordered are listed, but only abnormal results are displayed) Labs Reviewed  COMPREHENSIVE METABOLIC PANEL - Abnormal; Notable for the following components:      Result Value   Potassium 7.4 (*)    Chloride 91 (*)    CO2 12 (*)    Glucose, Bld 176 (*)    BUN 215 (*)    Creatinine, Ser 16.14 (*)    AST 10 (*)    Total Bilirubin 1.5 (*)  GFR, Estimated 3 (*)    Anion gap 34 (*)    All other components within normal limits  CBC WITH DIFFERENTIAL/PLATELET - Abnormal; Notable for the following components:   WBC 11.7 (*)    Neutro Abs 10.7 (*)    Lymphs Abs 0.4 (*)    All other components within normal limits  PROTIME-INR - Abnormal; Notable for the following components:   Prothrombin Time 15.4 (*)    All other components within normal limits  RESP PANEL BY RT-PCR (FLU A&B, COVID) ARPGX2  CULTURE, BLOOD (ROUTINE X 2)  CULTURE, BLOOD (ROUTINE X 2)  URINE CULTURE  LACTIC ACID, PLASMA  APTT  ETHANOL  URINALYSIS, COMPLETE (UACMP) WITH MICROSCOPIC   ____________________________________________   EKG  Interpreted by me Sinus tachycardia, rate of 101.  Left axis.  First-degree AV block with a PR interval of 290 ms.  QTc is normal.  Normal QRS ST segments and T waves.  ____________________________________________    RADIOLOGY  DG Chest Port 1 View  Result Date: 04/12/2021 CLINICAL DATA:  Sepsis EXAM: PORTABLE CHEST 1 VIEW COMPARISON:  02/18/2017 FINDINGS: The heart size and mediastinal contours are within normal limits. Both lungs are clear. The visualized skeletal structures are unremarkable. IMPRESSION: No active disease. Electronically Signed   By: Elige KoHetal  Patel   On: 04/12/2021 17:31     ____________________________________________   PROCEDURES .Critical Care Performed by: Sharman CheekStafford, Doreene Forrey, MD Authorized by: Sharman CheekStafford, Richerd Grime, MD   Critical care provider statement:    Critical care time (minutes):  45   Critical care time was exclusive of:  Separately billable procedures and treating other patients   Critical care was necessary to treat or prevent imminent or life-threatening deterioration of the following conditions:  Shock, dehydration, renal failure and CNS failure or compromise   Critical care was time spent personally by me on the following activities:  Development of treatment plan with patient or surrogate, discussions with consultants, evaluation of patient's response to treatment, examination of patient, obtaining history from patient or surrogate, ordering and performing treatments and interventions, ordering and review of laboratory studies, ordering and review of radiographic studies, pulse oximetry, re-evaluation of patient's condition and review of old charts    ____________________________________________  DIFFERENTIAL DIAGNOSIS   Dehydration, electrolyte abnormality, alcohol withdrawal, acute infection with septic shock, delirium, alcoholic gastritis  CLINICAL IMPRESSION / ASSESSMENT AND PLAN / ED COURSE  Medications ordered in the ED: Medications  sodium chloride 0.9 % bolus 1,000 mL (1,000 mLs Intravenous New Bag/Given 04/12/21 1637)    And  sodium chloride 0.9 % bolus 1,000 mL (has no administration in time range)    And  sodium chloride 0.9 % bolus 500 mL (has no administration in time range)  cefTRIAXone (ROCEPHIN) 2 g in sodium chloride 0.9 % 100 mL IVPB (2 g Intravenous New Bag/Given 04/12/21 1631)  azithromycin (ZITHROMAX) 500 mg in sodium chloride 0.9 % 250 mL IVPB (500 mg Intravenous Patient Refused/Not Given 04/12/21 1713)  calcium gluconate inj 10% (1 g) URGENT USE ONLY! (has no administration in time range)  sodium bicarbonate  injection 50 mEq (has no administration in time range)  insulin aspart (novoLOG) injection 10 Units (has no administration in time range)    And  dextrose 50 % solution 100 mL (has no administration in time range)  haloperidol lactate (HALDOL) injection 5 mg (has no administration in time range)  sodium zirconium cyclosilicate (LOKELMA) packet 10 g (has no administration in time range)  sodium bicarbonate 150 mEq in  dextrose 5 % 1,150 mL infusion (has no administration in time range)  LORazepam (ATIVAN) injection 2 mg (2 mg Intravenous Given 04/12/21 1725)    Pertinent labs & imaging results that were available during my care of the patient were reviewed by me and considered in my medical decision making (see chart for details).   Isaac Morrison was evaluated in Emergency Department on 04/12/2021 for the symptoms described in the history of present illness. He was evaluated in the context of the global COVID-19 pandemic, which necessitated consideration that the patient might be at risk for infection with the SARS-CoV-2 virus that causes COVID-19. Institutional protocols and algorithms that pertain to the evaluation of patients at risk for COVID-19 are in a state of rapid change based on information released by regulatory bodies including the CDC and federal and state organizations. These policies and algorithms were followed during the patient's care in the ED.     Clinical Course as of 04/12/21 1810  Fri Apr 12, 2021  1715 Labs shows acute renal failure with a creatinine of 16, BUN of greater than 200, potassium of 7.4.  This is a life-threatening emergency.  However, the patient is agitated, confused, and refusing all care.  I will IVC him, give IV Ativan as needed.  Blood pressures improved with IV fluids. [PS]  1731 Case discussed with nephrology Dr. Thedore Mins.  Will page ICU for admission. [PS]  1732 Chest x-ray viewed and interpreted by me, unremarkable. [PS]  1809 With findings of acute renal  failure explaining the patient's presentation, i'm worried about acute volume overload from 2.5 L NS bolus. Will scale back to 1.5L bolus.  Pt is confused and unable to participate in a shared decision making discussion. [PS]    Clinical Course User Index [PS] Sharman Cheek, MD     ----------------------------------------- 5:55 PM on 04/12/2021 -----------------------------------------  Case discussed with ICU Dr. Richardson Dopp who will evaluate.  ____________________________________________   FINAL CLINICAL IMPRESSION(S) / ED DIAGNOSES    Final diagnoses:  Acute renal failure, unspecified acute renal failure type [facility])  Delirium     ED Discharge Orders    None      Portions of this note were generated with dragon dictation software. Dictation errors may occur despite best attempts at proofreading.   Sharman Cheek, MD 04/12/21 1755

## 2021-04-12 NOTE — ED Notes (Signed)
Pt placed in mittens per MD Scotty Court order at this time

## 2021-04-12 NOTE — Progress Notes (Signed)
Orders were given to dialyze this patient tonight emergently due to hyperkalemia ( k of 7.4). Awaiting Dialysis Access and ICU placement at this time. Dr. Thedore Mins and Dr. Cherylann Ratel have been mae aware of the delays. Patient is noted with hypotension per flowsheet record from ER. Dr. Cherylann Ratel was called and made aware and this RN expressed concerns about initiating HD with such low Blood pressures: As per Dr. Lateef:orders were given to initiate Hd and maintain a systolic Bp of 75. Also DR. Lateef suggests that ICU/ER team consider adding vasopressors to support BP during HD.

## 2021-04-13 ENCOUNTER — Inpatient Hospital Stay: Payer: Medicare Other

## 2021-04-13 LAB — COMPREHENSIVE METABOLIC PANEL
ALT: 16 U/L (ref 0–44)
ALT: 16 U/L (ref 0–44)
AST: 11 U/L — ABNORMAL LOW (ref 15–41)
AST: 13 U/L — ABNORMAL LOW (ref 15–41)
Albumin: 3.1 g/dL — ABNORMAL LOW (ref 3.5–5.0)
Albumin: 2.8 g/dL — ABNORMAL LOW (ref 3.5–5.0)
Alkaline Phosphatase: 59 U/L (ref 38–126)
Alkaline Phosphatase: 54 U/L (ref 38–126)
Anion gap: 26 — ABNORMAL HIGH (ref 5–15)
Anion gap: 18 — ABNORMAL HIGH (ref 5–15)
BUN: 266 mg/dL — ABNORMAL HIGH (ref 6–20)
BUN: 117 mg/dL — ABNORMAL HIGH (ref 6–20)
CO2: 13 mmol/L — ABNORMAL LOW (ref 22–32)
CO2: 23 mmol/L (ref 22–32)
Calcium: 8.5 mg/dL — ABNORMAL LOW (ref 8.9–10.3)
Calcium: 8 mg/dL — ABNORMAL LOW (ref 8.9–10.3)
Chloride: 100 mmol/L (ref 98–111)
Chloride: 97 mmol/L — ABNORMAL LOW (ref 98–111)
Creatinine, Ser: 13.8 mg/dL — ABNORMAL HIGH (ref 0.61–1.24)
Creatinine, Ser: 6.02 mg/dL — ABNORMAL HIGH (ref 0.61–1.24)
GFR, Estimated: 4 mL/min — ABNORMAL LOW (ref >60)
GFR, Estimated: 10 mL/min — ABNORMAL LOW (ref >60)
Glucose, Bld: 174 mg/dL — ABNORMAL HIGH (ref 70–99)
Glucose, Bld: 186 mg/dL — ABNORMAL HIGH (ref 70–99)
Potassium: 5.4 mmol/L — ABNORMAL HIGH (ref 3.5–5.1)
Potassium: 3.3 mmol/L — ABNORMAL LOW (ref 3.5–5.1)
Sodium: 139 mmol/L (ref 135–145)
Sodium: 138 mmol/L (ref 135–145)
Total Bilirubin: 1.2 mg/dL (ref 0.3–1.2)
Total Bilirubin: 1.4 mg/dL — ABNORMAL HIGH (ref 0.3–1.2)
Total Protein: 6.2 g/dL — ABNORMAL LOW (ref 6.5–8.1)
Total Protein: 5.6 g/dL — ABNORMAL LOW (ref 6.5–8.1)

## 2021-04-13 LAB — CBC
HCT: 33.9 % — ABNORMAL LOW (ref 39.0–52.0)
Hemoglobin: 12.3 g/dL — ABNORMAL LOW (ref 13.0–17.0)
MCH: 32.1 pg (ref 26.0–34.0)
MCHC: 36.3 g/dL — ABNORMAL HIGH (ref 30.0–36.0)
MCV: 88.5 fL (ref 80.0–100.0)
Platelets: 126 10*3/uL — ABNORMAL LOW (ref 150–400)
RBC: 3.83 MIL/uL — ABNORMAL LOW (ref 4.22–5.81)
RDW: 12.8 % (ref 11.5–15.5)
WBC: 6.1 10*3/uL (ref 4.0–10.5)
nRBC: 0 % (ref 0.0–0.2)

## 2021-04-13 LAB — PHOSPHORUS
Phosphorus: 13.7 mg/dL — ABNORMAL HIGH (ref 2.5–4.6)
Phosphorus: 7.2 mg/dL — ABNORMAL HIGH (ref 2.5–4.6)

## 2021-04-13 LAB — HEPATITIS C ANTIBODY: HCV Ab: NONREACTIVE

## 2021-04-13 LAB — GLUCOSE, CAPILLARY
Glucose-Capillary: 149 mg/dL — ABNORMAL HIGH (ref 70–99)
Glucose-Capillary: 155 mg/dL — ABNORMAL HIGH (ref 70–99)

## 2021-04-13 LAB — MAGNESIUM
Magnesium: 2.7 mg/dL — ABNORMAL HIGH (ref 1.7–2.4)
Magnesium: 2.1 mg/dL (ref 1.7–2.4)

## 2021-04-13 LAB — HEPATITIS B SURFACE ANTIBODY,QUALITATIVE: Hep B S Ab: NONREACTIVE

## 2021-04-13 MED ORDER — HEPARIN SODIUM (PORCINE) 5000 UNIT/ML IJ SOLN
5000.0000 [IU] | Freq: Three times a day (TID) | INTRAMUSCULAR | Status: DC
  Administered 2021-04-13 – 2021-04-17 (×11): 5000 [IU] via SUBCUTANEOUS
  Filled 2021-04-13 (×12): qty 1

## 2021-04-13 MED ORDER — THIAMINE HCL 100 MG PO TABS
100.0000 mg | ORAL_TABLET | Freq: Every day | ORAL | Status: DC
  Administered 2021-04-17: 08:00:00 100 mg via ORAL
  Filled 2021-04-13: qty 1

## 2021-04-13 MED ORDER — POTASSIUM CHLORIDE 10 MEQ/100ML IV SOLN
10.0000 meq | Freq: Once | INTRAVENOUS | Status: AC
  Administered 2021-04-13: 10 meq via INTRAVENOUS
  Filled 2021-04-13: qty 100

## 2021-04-13 MED ORDER — LORAZEPAM 2 MG/ML IJ SOLN
0.0000 mg | Freq: Three times a day (TID) | INTRAMUSCULAR | Status: AC
  Administered 2021-04-15: 16:00:00 2 mg via INTRAVENOUS
  Administered 2021-04-15 – 2021-04-16 (×4): 1 mg via INTRAVENOUS
  Filled 2021-04-13 (×6): qty 1

## 2021-04-13 MED ORDER — LORAZEPAM 2 MG/ML IJ SOLN
0.0000 mg | INTRAMUSCULAR | Status: AC
  Administered 2021-04-13 – 2021-04-14 (×2): 2 mg via INTRAVENOUS
  Administered 2021-04-14: 1 mg via INTRAVENOUS
  Administered 2021-04-14 – 2021-04-15 (×4): 2 mg via INTRAVENOUS
  Filled 2021-04-13 (×9): qty 1

## 2021-04-13 MED ORDER — LORAZEPAM 1 MG PO TABS: 1.0000 mg | ORAL_TABLET | ORAL | Status: AC | PRN

## 2021-04-13 MED ORDER — ADULT MULTIVITAMIN W/MINERALS CH
1.0000 | ORAL_TABLET | Freq: Every day | ORAL | Status: DC
  Administered 2021-04-17 – 2021-04-20 (×4): 1 via ORAL
  Filled 2021-04-13 (×4): qty 1

## 2021-04-13 MED ORDER — LORAZEPAM 2 MG/ML IJ SOLN
1.0000 mg | INTRAMUSCULAR | Status: AC | PRN
  Administered 2021-04-13 – 2021-04-15 (×2): 2 mg via INTRAVENOUS
  Administered 2021-04-15 – 2021-04-16 (×2): 1 mg via INTRAVENOUS
  Filled 2021-04-13: qty 2
  Filled 2021-04-13 (×3): qty 1

## 2021-04-13 MED ORDER — FOLIC ACID 1 MG PO TABS: 1.0000 mg | ORAL_TABLET | Freq: Every day | ORAL | Status: DC

## 2021-04-13 MED ORDER — THIAMINE HCL 100 MG/ML IJ SOLN
100.0000 mg | Freq: Every day | INTRAMUSCULAR | Status: DC
  Administered 2021-04-13 – 2021-04-16 (×4): 100 mg via INTRAVENOUS
  Filled 2021-04-13 (×4): qty 2

## 2021-04-13 NOTE — Progress Notes (Signed)
[facility]  ROUNDING NOTE   Subjective:   Hyperkalemia significantly improved since yesterday. Potassium in fact a bit low at 3.3 now. Azotemia significantly improved as BUN down to 117 with a creatinine of 6. Patient is actually making urine now.   Objective:  Vital signs in last 24 hours:  Temp:  [97.5 F (36.4 C)-99.2 F (37.3 C)] 98.7 F (37.1 C) (04/30 0800) Pulse Rate:  [71-120] 97 (04/30 0900) Resp:  [10-28] 16 (04/30 0900) BP: (62-152)/(39-110) 78/56 (04/30 0824) SpO2:  [94 %-100 %] 99 % (04/30 0900) Weight:  [69.5 kg-81.6 kg] 69.5 kg (04/30 0504)  Weight change:  Filed Weights   04/12/21 1609 04/13/21 0504  Weight: 81.6 kg 69.5 kg    Intake/Output: I/O last 3 completed shifts: In: 2547.4 [I.V.:1547.4; IV Piggyback:1000] Out: 1060 [Urine:1150]   Intake/Output this shift:  Total I/O In: -  Out: 220 [Urine:220]  Physical Exam: General:  No acute distress  Head:  Normocephalic, atraumatic. Moist oral mucosal membranes  Eyes:  Anicteric  Neck:  Supple  Lungs:   Clear to auscultation, normal effort  Heart:  S1S2 no rubs  Abdomen:   Soft, nontender, bowel sounds present  Extremities:  Right AKA, no lower extremity edema  Neurologic:  Lethargic but arousable  Skin:  No acute skin rash  Access:  Right IJ temporary dialysis catheter    Basic Metabolic Panel: Recent Labs  Lab 04/12/21 1559 04/12/21 1845 04/12/21 2228 04/13/21 0457  NA 137  --  139 138  K 7.4*  --  5.4* 3.3*  CL 91*  --  100 97*  CO2 12*  --  13* 23  GLUCOSE 176*  --  174* 186*  BUN 215*  --  266* 117*  CREATININE 16.14*  --  13.80* 6.02*  CALCIUM 9.1  --  8.5* 8.0*  MG  --  2.9* 2.7* 2.1  PHOS  --  16.4* 13.7* 7.2*    Liver Function Tests: Recent Labs  Lab 04/12/21 1559 04/12/21 2228 04/13/21 0457  AST 10* 11* 13*  ALT 17 16 16   ALKPHOS 77 59 54  BILITOT 1.5* 1.2 1.4*  PROT 7.5 6.2* 5.6*  ALBUMIN 3.8 3.1* 2.8*   No results for input(s): LIPASE, AMYLASE  in the last 168 hours. No results for input(s): AMMONIA in the last 168 hours.  CBC: Recent Labs  Lab 04/12/21 1559 04/13/21 0457  WBC 11.7* 6.1  NEUTROABS 10.7*  --   HGB 15.1 12.3*  HCT 42.0 33.9*  MCV 89.2 88.5  PLT 207 126*    Cardiac Enzymes: Recent Labs  Lab 04/12/21 1745  CKTOTAL 588*    BNP: Invalid input(s): POCBNP  CBG: Recent Labs  Lab 04/12/21 2235  GLUCAP 151*    Microbiology: Results for orders placed or performed during the hospital encounter of 04/12/21  Resp Panel by RT-PCR (Flu A&B, Covid) Nasopharyngeal Swab     Status: None   Collection Time: 04/12/21  4:12 PM   Specimen: Nasopharyngeal Swab; Nasopharyngeal(NP) swabs in vial transport medium  Result Value Ref Range Status   SARS Coronavirus 2 by RT PCR NEGATIVE NEGATIVE Final    Comment: (NOTE) SARS-CoV-2 target nucleic acids are NOT DETECTED.  The SARS-CoV-2 RNA is generally detectable in upper respiratory specimens during the acute phase of infection. The lowest concentration of SARS-CoV-2 viral copies this assay can detect is 138 copies/mL. A negative result does not preclude SARS-Cov-2 infection and should not be used as the sole basis for treatment  or other patient management decisions. A negative result may occur with  improper specimen collection/handling, submission of specimen other than nasopharyngeal swab, presence of viral mutation(s) within the areas targeted by this assay, and inadequate number of viral copies(<138 copies/mL). A negative result must be combined with clinical observations, patient history, and epidemiological information. The expected result is Negative.  Fact Sheet for Patients:  BloggerCourse.com  Fact Sheet for Healthcare Providers:  SeriousBroker.it  This test is no t yet approved or cleared by the [place] FDA and  has been authorized for detection and/or diagnosis of SARS-CoV-2 by FDA under an  Emergency Use Authorization (EUA). This EUA will remain  in effect (meaning this test can be used) for the duration of the COVID-19 declaration under Section 564(b)(1) of the Act, 21 U.S.C.section 360bbb-3(b)(1), unless the authorization is terminated  or revoked sooner.       Influenza A by PCR NEGATIVE NEGATIVE Final   Influenza B by PCR NEGATIVE NEGATIVE Final    Comment: (NOTE) The Xpert Xpress SARS-CoV-2/FLU/RSV plus assay is intended as an aid in the diagnosis of influenza from Nasopharyngeal swab specimens and should not be used as a sole basis for treatment. Nasal washings and aspirates are unacceptable for Xpert Xpress SARS-CoV-2/FLU/RSV testing.  Fact Sheet for Patients: BloggerCourse.com  Fact Sheet for Healthcare Providers: SeriousBroker.it  This test is not yet approved or cleared by the [place] FDA and has been authorized for detection and/or diagnosis of SARS-CoV-2 by FDA under an Emergency Use Authorization (EUA). This EUA will remain in effect (meaning this test can be used) for the duration of the COVID-19 declaration under Section 564(b)(1) of the Act, 21 U.S.C. section 360bbb-3(b)(1), unless the authorization is terminated or revoked.  Performed at [facility], [address] [place], [place] [address]   Blood Culture (routine x 2)     Status: None (Preliminary result)   Collection Time: 04/12/21  4:12 PM   Specimen: BLOOD  Result Value Ref Range Status   Specimen Description BLOOD BLOOD RIGHT WRIST  Final   Special Requests   Final    BOTTLES DRAWN AEROBIC AND ANAEROBIC Blood Culture adequate volume   Culture   Final    NO GROWTH < 24 HOURS Performed at [facility], [address], [place], [place] [address]    Report Status PENDING  Incomplete  Blood Culture (routine x 2)     Status: None (Preliminary result)   Collection Time: 04/12/21  4:17 PM   Specimen: BLOOD   Result Value Ref Range Status   Specimen Description BLOOD LEFT ANTECUBITAL  Final   Special Requests   Final    BOTTLES DRAWN AEROBIC AND ANAEROBIC Blood Culture adequate volume   Culture   Final    NO GROWTH < 24 HOURS Performed at [facility], [address], [place], [place] [address]    Report Status PENDING  Incomplete    Coagulation Studies: Recent Labs    04/12/21 1559  LABPROT 15.4*  INR 1.2    Urinalysis: Recent Labs    04/12/21 1545  COLORURINE AMBER*  LABSPEC 1.018  PHURINE 5.0  GLUCOSEU 50*  HGBUR SMALL*  BILIRUBINUR NEGATIVE  KETONESUR 5*  PROTEINUR NEGATIVE  NITRITE NEGATIVE  LEUKOCYTESUR NEGATIVE      Imaging: US RENAL  Result Date: 04/13/2021 CLINICAL DATA:  Acute renal failure EXAM: RENAL / URINARY TRACT ULTRASOUND COMPLETE COMPARISON:  None. FINDINGS: Right Kidney: Renal measurements: 10.9 x 4.9 x 5.8 cm =  volume: 163 mL. Echogenicity within normal limits. No mass or hydronephrosis visualized. Left Kidney: Renal measurements: 10.9 x 5.6 x 5.5 cm = volume: 178 mL. Echogenicity within normal limits. No mass or hydronephrosis visualized. Bladder: The bladder contains a Foley catheter. Despite Foley catheter, the bladder is filled with urine. Other: None. IMPRESSION: 1. The kidneys are normal in appearance. 2. Despite the presence of a Foley catheter, the bladder is filled with urine. Electronically Signed   By: Gerome Sam III M.D   On: 04/13/2021 08:34   DG Chest Portable 1 View  Result Date: 04/12/2021 CLINICAL DATA:  Hemodialysis catheter placement. EXAM: PORTABLE CHEST 1 VIEW COMPARISON:  April 12, 2021 FINDINGS: A right internal jugular venous catheter is seen with its distal tip near the junction of the superior vena cava and right atrium. This represents a new finding when compared to the prior study. Decreased lung volumes are seen, likely secondary to the degree of patient inspiration. Subsequent crowding of the bronchovascular lung  markings is noted. There is no evidence of acute infiltrate, pleural effusion or pneumothorax. The heart size and mediastinal contours are within normal limits. An intact right shoulder replacement is seen. The visualized skeletal structures are otherwise unremarkable. IMPRESSION: 1. Right internal jugular venous catheter placement and positioning, as described above. Electronically Signed   By: Aram Candela M.D.   On: 04/12/2021 21:36   DG Chest Port 1 View  Result Date: 04/12/2021 CLINICAL DATA:  Sepsis EXAM: PORTABLE CHEST 1 VIEW COMPARISON:  02/18/2017 FINDINGS: The heart size and mediastinal contours are within normal limits. Both lungs are clear. The visualized skeletal structures are unremarkable. IMPRESSION: No active disease. Electronically Signed   By: Elige Ko   On: 04/12/2021 17:31     Medications:   . sodium chloride 250 mL (04/13/21 0915)  . azithromycin    . cefTRIAXone (ROCEPHIN)  IV Stopped (04/12/21 1852)  . dexmedetomidine (PRECEDEX) IV infusion Stopped (04/12/21 2127)  . norepinephrine (LEVOPHED) Adult infusion 2 mcg/min (04/13/21 0917)  . sodium chloride     . Chlorhexidine Gluconate Cloth  6 each Topical Q0600  . folic acid  1 mg Oral Daily  . LORazepam  0-4 mg Intravenous Q4H   Followed by  . [START ON 04/15/2021] LORazepam  0-4 mg Intravenous Q8H  . multivitamin with minerals  1 tablet Oral Daily  . nicotine  14 mg Transdermal Daily  . thiamine  100 mg Oral Daily   Or  . thiamine  100 mg Intravenous Daily   docusate sodium, heparin, LORazepam **OR** LORazepam, polyethylene glycol  Assessment/ Plan:  60 y.o. male with past medical history of peripheral vascular disease status post right AKA, diabetes mellitus type 2, hypertension, hyperlipidemia, alcohol abuse who presented with severe acute kidney injury, hyperkalemia, metabolic acidosis.  1.  Acute kidney injury secondary to severe volume depletion. 2.  Severe hyperkalemia upon admission, improved  with dialysis. 3.  Metabolic acidosis. 4.  Hypotension.  Plan: Patient underwent 1 dialysis session.  Now making urine.  Hyperkalemia corrected.  No immediate need for additional dialysis.  Continue pressors and IV fluids for support.  We will reevaluate the potential need for additional dialysis treatment tomorrow.  Renal ultrasound reviewed and negative for hydronephrosis.   LOS: 1 Tiahna Cure 4/30/202210:12 AM

## 2021-04-13 NOTE — Progress Notes (Signed)
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  He has a history of chronic alcoholism, lifelong smoking history.  Brother of patient was present and was able to provide additional details including cocaine history.  Patient denies seizures in the past.  During interview patient is able to answer questions and is able to tell me that he is having visual and auditory hallucinations which are new for him and have lasted approximately 3 to 4 days.  Brother of patient states that he has been with decreased mentation and altered sensorium in the past 1 week.  Patient has severe AKI and is admitted to the medical intensive care unit for requirement of emergent dialysis.  Patient was seen by nephrology and plan for HD overnight.  04/13/21- patient had HD overnight with significant improvement in renal function. His mentation declined overnight and there is plan for CT head today. I suspect this is related to metabolic derrangement with sedation delivered overnight.  Lines/tubes :   Microbiology/Sepsis markers: Results for orders placed or performed during the hospital encounter of 04/12/21  Resp Panel by RT-PCR (Flu A&B, Covid) Nasopharyngeal Swab     Status: None   Collection Time: 04/12/21  4:12 PM   Specimen: Nasopharyngeal Swab; Nasopharyngeal(NP) swabs in vial transport medium  Result Value Ref Range Status   SARS Coronavirus 2 by RT PCR NEGATIVE NEGATIVE Final    Comment:  (NOTE) SARS-CoV-2 target nucleic acids are NOT DETECTED.  The SARS-CoV-2 RNA is generally detectable in upper respiratory specimens during the acute phase of infection. The lowest concentration of SARS-CoV-2 viral copies this assay can detect is 138 copies/mL. A negative result does not preclude SARS-Cov-2 infection and should not be used as the sole basis for treatment or other patient management decisions. A negative result may occur with  improper specimen collection/handling, submission of specimen other than nasopharyngeal swab, presence of viral mutation(s) within the areas targeted by this assay, and inadequate number of viral copies(<138 copies/mL). A negative result must be combined with clinical observations, patient history, and epidemiological information. The expected result is Negative.  Fact Sheet for Patients:  BloggerCourse.com  Fact Sheet for Healthcare Providers:  SeriousBroker.it  This test is no t yet approved or cleared by the [place] FDA and  has been authorized for detection and/or diagnosis of SARS-CoV-2 by FDA under an Emergency Use Authorization (EUA). This EUA will remain  in effect (meaning this test can be used) for the duration of the COVID-19 declaration under Section 564(b)(1) of the Act, 21 U.S.C.section 360bbb-3(b)(1), unless the authorization is terminated  or revoked sooner.       Influenza A by PCR NEGATIVE NEGATIVE Final   Influenza B by PCR NEGATIVE NEGATIVE Final    Comment: (NOTE) The Xpert Xpress SARS-CoV-2/FLU/RSV plus assay is intended as an aid in the diagnosis of influenza from Nasopharyngeal swab specimens and should not be used as a sole basis for treatment. Nasal washings and aspirates are unacceptable for Xpert Xpress SARS-CoV-2/FLU/RSV testing.  Fact Sheet for Patients: BloggerCourse.com  Fact Sheet for Healthcare  Providers: SeriousBroker.it  This test is not yet approved or cleared by the [place] FDA  and has been authorized for detection and/or diagnosis of SARS-CoV-2 by FDA under an Emergency Use Authorization (EUA). This EUA will remain in effect (meaning this test can be used) for the duration of the COVID-19 declaration under Section 564(b)(1) of the Act, 21 U.S.C. section 360bbb-3(b)(1), unless the authorization is terminated or revoked.  Performed at [facility], [address] [place], [place] [address]   Blood Culture (routine x 2)     Status: None (Preliminary result)   Collection Time: 04/12/21  4:12 PM   Specimen: BLOOD  Result Value Ref Range Status   Specimen Description BLOOD BLOOD RIGHT WRIST  Final   Special Requests   Final    BOTTLES DRAWN AEROBIC AND ANAEROBIC Blood Culture adequate volume   Culture   Final    NO GROWTH < 24 HOURS Performed at [facility], [address] [place], [place] [address]    Report Status PENDING  Incomplete  Blood Culture (routine x 2)     Status: None (Preliminary result)   Collection Time: 04/12/21  4:17 PM   Specimen: BLOOD  Result Value Ref Range Status   Specimen Description BLOOD LEFT ANTECUBITAL  Final   Special Requests   Final    BOTTLES DRAWN AEROBIC AND ANAEROBIC Blood Culture adequate volume   Culture   Final    NO GROWTH < 24 HOURS Performed at [facility], [address] [place], [place] [address]    Report Status PENDING  Incomplete    Anti-infectives:  Anti-infectives (From admission, onward)   Start     Dose/Rate Route Frequency Ordered Stop   04/12/21 1615  cefTRIAXone (ROCEPHIN) 2 g in sodium chloride 0.9 % 100 mL IVPB        2 g 200 mL/hr over 30 Minutes Intravenous Every 24 hours 04/12/21 1611     04/12/21 1615  azithromycin (ZITHROMAX) 500 mg in sodium chloride 0.9 % 250 mL IVPB        500 mg 250 mL/hr over 60 Minutes Intravenous Every 24 hours  04/12/21 1611          PAST MEDICAL HISTORY   Past Medical History:  Diagnosis Date  . Diabetes mellitus without complication (HCC)   . Glaucoma   . Hyperlipidemia   . Hypertension      SURGICAL HISTORY   Past Surgical History:  Procedure Laterality Date  . REPLACEMENT TOTAL KNEE BILATERAL Bilateral   . Skin grafts    . TOTAL SHOULDER REPLACEMENT Right      FAMILY HISTORY   Family History  Problem Relation Age of Onset  . Alzheimer's disease Father      SOCIAL HISTORY   Social History   Tobacco Use  . Smoking status: Never Smoker  . Smokeless tobacco: Never Used  Substance Use Topics  . Alcohol use: Yes    Alcohol/week: 49.0 standard drinks    Types: 49 Cans of beer per week  . Drug use: No     MEDICATIONS   Current Medication:  Current Facility-Administered Medications:  .  0.9 %  sodium chloride infusion, 250 mL, Intravenous, Continuous, Rust-Chester, Britton L, NP, Last Rate: 20 mL/hr at 04/13/21 1000, Infusion Verify at 04/13/21 1000 .  azithromycin (ZITHROMAX) 500 mg in sodium chloride 0.9 % 250 mL IVPB, 500 mg, Intravenous, Q24H, Sharman CheekStafford, Phillip, MD .  cefTRIAXone (ROCEPHIN) 2 g in sodium chloride 0.9 % 100 mL IVPB, 2 g, Intravenous, Q24H, Sharman CheekStafford, Phillip, MD, Stopped at 04/12/21 1852 .  Chlorhexidine Gluconate Cloth 2 % PADS 6 each, 6 each, Topical, Q0600, Singh, Harmeet, MD,Mosetta Pigeon04/30/22 0516 .  dexmedetomidine (PRECEDEX) 400 MCG/100ML (4 mcg/mL) infusion, 0.4-1.2 mcg/kg/hr, Intravenous, Titrated, Rust-Chester, Cecelia Byars, NP, Stopped at 04/12/21 2127 .  docusate sodium (COLACE) capsule 100 mg, 100 mg, Oral, BID PRN, Vida Rigger, MD .  folic acid (FOLVITE) tablet 1 mg, 1 mg, Oral, Daily, Rust-Chester, Britton L, NP .  heparin injection 1,000-6,000 Units, 1,000-6,000 Units, Intravenous, BID PRN, Rust-Chester, Cecelia Byars, NP, 2,800 Units at 04/13/21 0038 .  heparin injection 5,000 Units, 5,000 Units, Subcutaneous, Q8H, Eileene Kisling,  MD .  LORazepam (ATIVAN) injection 0-4 mg, 0-4 mg, Intravenous, Q4H **FOLLOWED BY** [START ON 04/15/2021] LORazepam (ATIVAN) injection 0-4 mg, 0-4 mg, Intravenous, Q8H, Rust-Chester, Britton L, NP .  LORazepam (ATIVAN) tablet 1-4 mg, 1-4 mg, Oral, Q1H PRN **OR** LORazepam (ATIVAN) injection 1-4 mg, 1-4 mg, Intravenous, Q1H PRN, Rust-Chester, Britton L, NP, 2 mg at 04/13/21 0653 .  multivitamin with minerals tablet 1 tablet, 1 tablet, Oral, Daily, Rust-Chester, Britton L, NP .  nicotine (NICODERM CQ - dosed in mg/24 hours) patch 14 mg, 14 mg, Transdermal, Daily, Caylie Sandquist, MD .  norepinephrine (LEVOPHED) 16 mg in premix infusion, 0-40 mcg/min, Intravenous, Titrated, Rust-Chester, Britton L, NP, Last Rate: 5.63 mL/hr at 04/13/21 1000, 6 mcg/min at 04/13/21 1000 .  polyethylene glycol (MIRALAX / GLYCOLAX) packet 17 g, 17 g, Oral, Daily PRN, Karna Christmas, Yaileen Hofferber, MD .  [COMPLETED] sodium chloride 0.9 % bolus 1,000 mL, 1,000 mL, Intravenous, Once, Stopped at 04/12/21 1707 **AND** sodium chloride 0.9 % bolus 1,000 mL, 1,000 mL, Intravenous, Once **AND** [COMPLETED] sodium chloride 0.9 % bolus 500 mL, 500 mL, Intravenous, Once, Sharman Cheek, MD, Stopped at 04/12/21 1918 .  thiamine tablet 100 mg, 100 mg, Oral, Daily **OR** thiamine (B-1) injection 100 mg, 100 mg, Intravenous, Daily, Rust-Chester, Cecelia Byars, NP    ALLERGIES   Patient has no known allergies.    REVIEW OF SYSTEMS     10 point ROS conducted exam except for hallucinations both auditory and visual.  PHYSICAL EXAMINATION   Vital Signs: Temp:  [97.5 F (36.4 C)-99.2 F (37.3 C)] 98.7 F (37.1 C) (04/30 0800) Pulse Rate:  [71-120] 95 (04/30 1300) Resp:  [10-28] 14 (04/30 1300) BP: (62-152)/(39-110) 111/60 (04/30 1300) SpO2:  [94 %-100 %] 98 % (04/30 1300) Weight:  [69.5 kg-81.6 kg] 69.5 kg (04/30 0504)  GENERAL: Older than stated age, no apparent distress, poorly responsive but saturation is 99% non tachypneic HEAD:  Normocephalic, atraumatic.  EYES: Pupils equal, round, reactive to light.  No scleral icterus.  Positive conjunctivitis  MOUTH: Moist mucosal membrane. NECK: Supple. No thyromegaly. No nodules. No JVD.  PULMONARY: Clear to auscultation with mild rhonchorous breath sounds bilaterally CARDIOVASCULAR: S1 and S2. Regular rate and rhythm. No murmurs, rubs, or gallops.  GASTROINTESTINAL: Soft, nontender, non-distended. No masses. Positive bowel sounds. No hepatosplenomegaly.  MUSCULOSKELETAL: No swelling, clubbing, or edema.  NEUROLOGIC: Altered sensorium with no focal deficit SKIN:intact,warm,dry   PERTINENT DATA     Infusions: . sodium chloride 20 mL/hr at 04/13/21 1000  . azithromycin    . cefTRIAXone (ROCEPHIN)  IV Stopped (04/12/21 1852)  . dexmedetomidine (PRECEDEX) IV infusion Stopped (04/12/21 2127)  . norepinephrine (LEVOPHED) Adult infusion 6 mcg/min (04/13/21 1000)  . sodium chloride     Scheduled Medications: . Chlorhexidine Gluconate Cloth  6 each Topical Q0600  . folic acid  1 mg Oral Daily  . heparin injection (  subcutaneous)  5,000 Units Subcutaneous Q8H  . LORazepam  0-4 mg Intravenous Q4H   Followed by  . [START ON 04/15/2021] LORazepam  0-4 mg Intravenous Q8H  . multivitamin with minerals  1 tablet Oral Daily  . nicotine  14 mg Transdermal Daily  . thiamine  100 mg Oral Daily   Or  . thiamine  100 mg Intravenous Daily   PRN Medications: docusate sodium, heparin, LORazepam **OR** LORazepam, polyethylene glycol Hemodynamic parameters:   Intake/Output: 04/29 0701 - 04/30 0700 In: 2547.4 [I.V.:1547.4; IV Piggyback:1000] Out: 1060 [Urine:1150]  Ventilator  Settings:    LAB RESULTS:  Basic Metabolic Panel: Recent Labs  Lab 04/12/21 1559 04/12/21 1845 04/12/21 2228 04/13/21 0457  NA 137  --  139 138  K 7.4*  --  5.4* 3.3*  CL 91*  --  100 97*  CO2 12*  --  13* 23  GLUCOSE 176*  --  174* 186*  BUN 215*  --  266* 117*  CREATININE 16.14*  --  13.80*  6.02*  CALCIUM 9.1  --  8.5* 8.0*  MG  --  2.9* 2.7* 2.1  PHOS  --  16.4* 13.7* 7.2*   Liver Function Tests: Recent Labs  Lab 04/12/21 1559 04/12/21 2228 04/13/21 0457  AST 10* 11* 13*  ALT 17 16 16   ALKPHOS 77 59 54  BILITOT 1.5* 1.2 1.4*  PROT 7.5 6.2* 5.6*  ALBUMIN 3.8 3.1* 2.8*   No results for input(s): LIPASE, AMYLASE in the last 168 hours. No results for input(s): AMMONIA in the last 168 hours. CBC: Recent Labs  Lab 04/12/21 1559 04/13/21 0457  WBC 11.7* 6.1  NEUTROABS 10.7*  --   HGB 15.1 12.3*  HCT 42.0 33.9*  MCV 89.2 88.5  PLT 207 126*   Cardiac Enzymes: Recent Labs  Lab 04/12/21 1745  CKTOTAL 588*   BNP: Invalid input(s): POCBNP CBG: Recent Labs  Lab 04/12/21 2235  GLUCAP 151*       IMAGING RESULTS:  Imaging: 04/14/21 RENAL  Result Date: 04/13/2021 CLINICAL DATA:  Acute renal failure EXAM: RENAL / URINARY TRACT ULTRASOUND COMPLETE COMPARISON:  None. FINDINGS: Right Kidney: Renal measurements: 10.9 x 4.9 x 5.8 cm = volume: 163 mL. Echogenicity within normal limits. No mass or hydronephrosis visualized. Left Kidney: Renal measurements: 10.9 x 5.6 x 5.5 cm = volume: 178 mL. Echogenicity within normal limits. No mass or hydronephrosis visualized. Bladder: The bladder contains a Foley catheter. Despite Foley catheter, the bladder is filled with urine. Other: None. IMPRESSION: 1. The kidneys are normal in appearance. 2. Despite the presence of a Foley catheter, the bladder is filled with urine. Electronically Signed   By: 04/15/2021 III M.D   On: 04/13/2021 08:34   DG Chest Portable 1 View  Result Date: 04/12/2021 CLINICAL DATA:  Hemodialysis catheter placement. EXAM: PORTABLE CHEST 1 VIEW COMPARISON:  April 12, 2021 FINDINGS: A right internal jugular venous catheter is seen with its distal tip near the junction of the superior vena cava and right atrium. This represents a new finding when compared to the prior study. Decreased lung volumes are seen,  likely secondary to the degree of patient inspiration. Subsequent crowding of the bronchovascular lung markings is noted. There is no evidence of acute infiltrate, pleural effusion or pneumothorax. The heart size and mediastinal contours are within normal limits. An intact right shoulder replacement is seen. The visualized skeletal structures are otherwise unremarkable. IMPRESSION: 1. Right internal jugular venous catheter placement and positioning, as described  above. Electronically Signed   By: Aram Candela M.D.   On: 04/12/2021 21:36   DG Chest Port 1 View  Result Date: 04/12/2021 CLINICAL DATA:  Sepsis EXAM: PORTABLE CHEST 1 VIEW COMPARISON:  02/18/2017 FINDINGS: The heart size and mediastinal contours are within normal limits. Both lungs are clear. The visualized skeletal structures are unremarkable. IMPRESSION: No active disease. Electronically Signed   By: Elige Ko   On: 04/12/2021 17:31   @PROBHOSP @ RENAL  Result Date: 04/13/2021 CLINICAL DATA:  Acute renal failure EXAM: RENAL / URINARY TRACT ULTRASOUND COMPLETE COMPARISON:  None. FINDINGS: Right Kidney: Renal measurements: 10.9 x 4.9 x 5.8 cm = volume: 163 mL. Echogenicity within normal limits. No mass or hydronephrosis visualized. Left Kidney: Renal measurements: 10.9 x 5.6 x 5.5 cm = volume: 178 mL. Echogenicity within normal limits. No mass or hydronephrosis visualized. Bladder: The bladder contains a Foley catheter. Despite Foley catheter, the bladder is filled with urine. Other: None. IMPRESSION: 1. The kidneys are normal in appearance. 2. Despite the presence of a Foley catheter, the bladder is filled with urine. Electronically Signed   By: 04/15/2021 III M.D   On: 04/13/2021 08:34   DG Chest Portable 1 View  Result Date: 04/12/2021 CLINICAL DATA:  Hemodialysis catheter placement. EXAM: PORTABLE CHEST 1 VIEW COMPARISON:  April 12, 2021 FINDINGS: A right internal jugular venous catheter is seen with its distal tip near the  junction of the superior vena cava and right atrium. This represents a new finding when compared to the prior study. Decreased lung volumes are seen, likely secondary to the degree of patient inspiration. Subsequent crowding of the bronchovascular lung markings is noted. There is no evidence of acute infiltrate, pleural effusion or pneumothorax. The heart size and mediastinal contours are within normal limits. An intact right shoulder replacement is seen. The visualized skeletal structures are otherwise unremarkable. IMPRESSION: 1. Right internal jugular venous catheter placement and positioning, as described above. Electronically Signed   By: Apr 14, 2021 M.D.   On: 04/12/2021 21:36   DG Chest Port 1 View  Result Date: 04/12/2021 CLINICAL DATA:  Sepsis EXAM: PORTABLE CHEST 1 VIEW COMPARISON:  02/18/2017 FINDINGS: The heart size and mediastinal contours are within normal limits. Both lungs are clear. The visualized skeletal structures are unremarkable. IMPRESSION: No active disease. Electronically Signed   By: 04/20/2017   On: 04/12/2021 17:31          ASSESSMENT AND PLAN    -Multidisciplinary rounds held today   Renal Failure-AKI stage V -Requiring emergent dialysis -follow UO -continue Foley Catheter-assess need daily -Urine drug screen -Nephrology team on case-plan for placement of triple-lumen dialysis catheter and emergent hemodialysis overnight  Altered mental status with confusion -Likely due to toxic metabolic encephalopathy secondary to severe AKI -Patient continues to deteriorate with compromise of airway may need intubation   High anion gap metabolic acidosis   -Patient is not with hyperglycemia, suspect metabolic derangements and lactic acidosis   -Currently receiving sodium bicarb infusion and planning for emergent dialysis   Severe hyperkalemia -Repeat twelve-lead EKG -peaked T waves and PR prolongation noted - deliver 1amp calcium gloconate -emergent  HD -Lokelma ordered  -Renal team on case appreciate input -pharmacy consultation   Chronic alcoholism - CIWA protocol -monitor for alcohol withdrawal syndrome -Ativan PRN ordered   Polysubstance abuse -Cocaine & tobacco history  -monitor for withdrawal -urine drug screen ordered -nicotine patch 14mg    Hx of right lower extermity osteosarcoma  - s/p  AKA at age 66  -fall precautions while with altered sensorium  - may need 1:1 sitter  ID -continue IV abx as prescibed -follow up cultures  GI/Nutrition GI PROPHYLAXIS as indicated DIET-->TF's as tolerated Constipation protocol as indicated  ENDO - ICU hypoglycemic\Hyperglycemia protocol -check FSBS per protocol    DVT/GI PRX ordered -SCDs  TRANSFUSIONS AS NEEDED MONITOR FSBS ASSESS the need for LABS as needed   Critical care provider statement:    Critical care time (minutes):  33   Critical care time was exclusive of:  Separately billable procedures and treating other patients   Critical care was necessary to treat or prevent imminent or life-threatening deterioration of the following conditions:  AKI stage5, hx of osteosarcoma, alcoholism, polysubstance abuse, multiple comorbid conditions   Critical care was time spent personally by me on the following activities:  Development of treatment plan with patient or surrogate, discussions with consultants, evaluation of patient's response to treatment, examination of patient, obtaining history from patient or surrogate, ordering and performing treatments and interventions, ordering and review of laboratory studies and re-evaluation of patient's condition.  I assumed direction of critical care for this patient from another provider in my specialty: no    This document was prepared using Dragon voice recognition software and may include unintentional dictation errors.    Vida Rigger, M.D.  Division of Pulmonary & Critical Care Medicine  [facility]

## 2021-04-13 NOTE — Consult Note (Signed)
PHARMACY CONSULT NOTE - FOLLOW UP  Pharmacy Consult for Electrolyte Monitoring and Replacement   Recent Labs: Potassium (mmol/L)  Date Value  04/13/2021 3.3 (L)   Magnesium (mg/dL)  Date Value  43/32/9518 2.1   Calcium (mg/dL)  Date Value  84/16/6063 8.0 (L)   Albumin (g/dL)  Date Value  01/60/1093 2.8 (L)   Phosphorus (mg/dL)  Date Value  23/55/7322 7.2 (H)   Sodium (mmol/L)  Date Value  04/13/2021 138     Assessment: Patient w/ severe AKI/hyperkalemia and admitted to the medical intensive care unit 4/29 for requirement of emergent dialysis. Patient with severe hyperkalemia ( K 7.9), acodisos (CO2 12) and uremia (BUN 215)  Patient was seen by nephrology and plan for emergent HD overnight  Goal of Therapy:  Electrolytes WNL   Plan:  4/30 0457  K 3.3  Mag 2.1  Phos 7.2  Scr 6.02 Patient admitted w/ severe hyperkalemia, underwent emergent HD 4/29. Nephrology following  No Hemodialysis planned for today 4/30 Will conservatively order KCL 10 meq IV x 1 bag given renal fxn.   Follow up electrolytes with AM labs  Angelique Blonder ,PharmD Clinical Pharmacist 04/13/2021 1:43 PM

## 2021-04-13 NOTE — Progress Notes (Signed)
Pt arrived from ED at approx 2130. Pt hypoglycemic, treated with 1 amp of dextrose per provider order, BG rechecked post dextrose and WNL. Pt received HD for 2 hours, B/P support with Levo. Transfer of care to Laura,RN @ 0130

## 2021-04-13 NOTE — Progress Notes (Signed)
Following up from morning assessment, the patient did not have a significant neuro assessment due to recent ativan given by night shift.  Upon assessment, patient requires deep sternal and pain to evoke any response. Patient does not cross midline, no localizing of pain to LUE, minimal pain response throughout. Patient's pupils are equal, round, and very sluggish to react to light. Patient does not fight when this RN opens eyes. He has increased eye edema/redness. Patient continues to have snoring respirations despite repositioning.   Patient required Levophed to be turned back on this AM due to inability to maintain blood pressure.   Isaac Silversmith, NP notified. Plan to order CT to evaluate.

## 2021-04-13 NOTE — Plan of Care (Signed)
Neuro:  Resp: stable on 2L [place] to room air CV: afebrile, Levo required for BP support GIGU: NPO, foley in place, no BM, no emesis Skin: scattered scars, redness on chest and face, scattered abrasions, otherwise clean dry and intact Social: Brother and friend at the bedside during the day. Interactive and support. All questions and concerns addressed.   Events: travelled to [place] for head imaging, no issues.  Problem: Education: Goal: Knowledge of General Education information will improve Description: Including pain rating scale, medication(s)/side effects and non-pharmacologic comfort measures Outcome: Not Progressing   Problem: Health Behavior/Discharge Planning: Goal: Ability to manage health-related needs will improve Outcome: Not Progressing   Problem: Clinical Measurements: Goal: Ability to maintain clinical measurements within normal limits will improve Outcome: Not Progressing Goal: Will remain free from infection Outcome: Not Progressing Goal: Diagnostic test results will improve Outcome: Not Progressing Goal: Respiratory complications will improve Outcome: Not Progressing Goal: Cardiovascular complication will be avoided Outcome: Not Progressing   Problem: Activity: Goal: Risk for activity intolerance will decrease Outcome: Not Progressing   Problem: Nutrition: Goal: Adequate nutrition will be maintained Outcome: Not Progressing   Problem: Coping: Goal: Level of anxiety will decrease Outcome: Not Progressing   Problem: Elimination: Goal: Will not experience complications related to bowel motility Outcome: Not Progressing Goal: Will not experience complications related to urinary retention Outcome: Not Progressing   Problem: Pain Managment: Goal: General experience of comfort will improve Outcome: Not Progressing   Problem: Safety: Goal: Ability to remain free from injury will improve Outcome: Not Progressing   Problem: Skin Integrity: Goal: Risk for  impaired skin integrity will decrease Outcome: Not Progressing

## 2021-04-13 NOTE — Plan of Care (Signed)

## 2021-04-14 ENCOUNTER — Encounter: Payer: Self-pay | Admitting: Pulmonary Disease

## 2021-04-14 LAB — GLUCOSE, CAPILLARY
Glucose-Capillary: 121 mg/dL — ABNORMAL HIGH (ref 70–99)
Glucose-Capillary: 129 mg/dL — ABNORMAL HIGH (ref 70–99)
Glucose-Capillary: 133 mg/dL — ABNORMAL HIGH (ref 70–99)
Glucose-Capillary: 140 mg/dL — ABNORMAL HIGH (ref 70–99)
Glucose-Capillary: 152 mg/dL — ABNORMAL HIGH (ref 70–99)
Glucose-Capillary: 160 mg/dL — ABNORMAL HIGH (ref 70–99)
Glucose-Capillary: 169 mg/dL — ABNORMAL HIGH (ref 70–99)
Glucose-Capillary: 174 mg/dL — ABNORMAL HIGH (ref 70–99)

## 2021-04-14 LAB — URINE CULTURE

## 2021-04-14 LAB — CBC
HCT: 37.7 % — ABNORMAL LOW (ref 39.0–52.0)
Hemoglobin: 13.4 g/dL (ref 13.0–17.0)
MCH: 32.3 pg (ref 26.0–34.0)
MCHC: 35.5 g/dL (ref 30.0–36.0)
MCV: 90.8 fL (ref 80.0–100.0)
Platelets: 123 10*3/uL — ABNORMAL LOW (ref 150–400)
RBC: 4.15 MIL/uL — ABNORMAL LOW (ref 4.22–5.81)
RDW: 12.9 % (ref 11.5–15.5)
WBC: 4.9 10*3/uL (ref 4.0–10.5)
nRBC: 0 % (ref 0.0–0.2)

## 2021-04-14 LAB — MAGNESIUM: Magnesium: 2 mg/dL (ref 1.7–2.4)

## 2021-04-14 LAB — PHOSPHORUS: Phosphorus: 3.7 mg/dL (ref 2.5–4.6)

## 2021-04-14 LAB — BASIC METABOLIC PANEL
Anion gap: 11 (ref 5–15)
BUN: 75 mg/dL — ABNORMAL HIGH (ref 6–20)
CO2: 28 mmol/L (ref 22–32)
Calcium: 8.5 mg/dL — ABNORMAL LOW (ref 8.9–10.3)
Chloride: 108 mmol/L (ref 98–111)
Creatinine, Ser: 1.5 mg/dL — ABNORMAL HIGH (ref 0.61–1.24)
GFR, Estimated: 54 mL/min — ABNORMAL LOW (ref >60)
Glucose, Bld: 138 mg/dL — ABNORMAL HIGH (ref 70–99)
Potassium: 4.2 mmol/L (ref 3.5–5.1)
Sodium: 147 mmol/L — ABNORMAL HIGH (ref 135–145)

## 2021-04-14 NOTE — Progress Notes (Signed)
Tele monitor changed form box 13 to box 29.  Verified with the CCMD to make sure he is on and being monitored.  Family i.e. brother and step mother in the room with the pt.  Pt is sleeping at the moment.  continuous monitoring will be done throughout the shift.

## 2021-04-14 NOTE — Consult Note (Signed)
PHARMACY CONSULT NOTE  Pharmacy Consult for Electrolyte Monitoring and Replacement   Recent Labs: Potassium (mmol/L)  Date Value  04/14/2021 4.2   Magnesium (mg/dL)  Date Value  27/25/3664 2.0   Calcium (mg/dL)  Date Value  40/34/7425 8.5 (L)   Albumin (g/dL)  Date Value  95/63/8756 2.8 (L)   Phosphorus (mg/dL)  Date Value  43/32/9518 3.7   Sodium (mmol/L)  Date Value  04/14/2021 147 (H)   Assessment: Patient w/ severe AKI/hyperkalemia and admitted to the medical intensive care unit 4/29 for requirement of emergent dialysis. Patient with severe hyperkalemia ( K 7.9), acodisos (CO2 12) and uremia (BUN 215)  Patient was seen by nephrology and plan for emergent HD overnight  Renal function markedly improved. 2550 mL UOP yesterday.  Goal of Therapy:  Electrolytes WNL   Plan:  --Na 147, will defer management to primary team --No electrolyte replacement warranted at this time --Follow-up electrolytes with AM labs tomorrow  Tressie Ellis 04/14/2021 7:34 AM

## 2021-04-14 NOTE — Progress Notes (Signed)
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  He has a history of chronic alcoholism, lifelong smoking history.  Brother of patient was present and was able to provide additional details including cocaine history.  Patient denies seizures in the past.  During interview patient is able to answer questions and is able to tell me that he is having visual and auditory hallucinations which are new for him and have lasted approximately 3 to 4 days.  Brother of patient states that he has been with decreased mentation and altered sensorium in the past 1 week.  Patient has severe AKI and is admitted to the medical intensive care unit for requirement of emergent dialysis.  Patient was seen by nephrology and plan for HD overnight.  04/13/21- patient had HD overnight with significant improvement in renal function. His mentation declined overnight and there is plan for CT head today. I suspect this is related to metabolic derrangement with sedation delivered overnight.  04/14/21- patient improved significantly. He is able to speak slowly now. Blood work with imrpoved renal indices post HD.  He continues to require PRN ativan per CIWA for EtOH withrdawal. Optimizing for TRH transfer today.   Lines/tubes :   Microbiology/Sepsis markers: Results for orders placed or performed during the hospital encounter of 04/12/21  Urine culture     Status: Abnormal   Collection Time: 04/12/21  3:45 PM   Specimen:  In/Out Cath Urine  Result Value Ref Range Status   Specimen Description   Final    IN/OUT CATH URINE Performed at [facility], [address], [place], [place] [address]    Special Requests   Final    NONE Performed at [facility], [address] [place], [place] [address]    Culture MULTIPLE SPECIES PRESENT, SUGGEST RECOLLECTION (A)  Final   Report Status 04/14/2021 FINAL  Final  Resp Panel by RT-PCR (Flu A&B, Covid) Nasopharyngeal Swab     Status: None   Collection Time: 04/12/21  4:12 PM   Specimen: Nasopharyngeal Swab; Nasopharyngeal(NP) swabs in vial transport medium  Result Value Ref Range Status   SARS Coronavirus 2 by RT PCR NEGATIVE NEGATIVE Final    Comment: (NOTE) SARS-CoV-2 target nucleic acids are NOT DETECTED.  The SARS-CoV-2 RNA is generally detectable in upper respiratory specimens during the acute phase of infection. The lowest concentration of SARS-CoV-2 viral copies this assay can detect is 138 copies/mL. A negative result does not preclude SARS-Cov-2 infection and should not be used as the sole basis for treatment or other patient management decisions. A negative result may occur with  improper specimen collection/handling, submission of specimen other than nasopharyngeal swab, presence of viral mutation(s) within the areas targeted by this assay, and inadequate number of viral copies(<138 copies/mL). A negative result must be combined with clinical observations, patient history, and epidemiological information. The expected result is Negative.  Fact Sheet for Patients:  BloggerCourse.com  Fact Sheet for Healthcare Providers:  SeriousBroker.it  This test is no t yet approved or cleared by the [place] FDA and  has been authorized for detection and/or diagnosis of SARS-CoV-2 by FDA under an Emergency Use Authorization (  EUA). This EUA will remain  in effect (meaning this test can  be used) for the duration of the COVID-19 declaration under Section 564(b)(1) of the Act, 21 U.S.C.section 360bbb-3(b)(1), unless the authorization is terminated  or revoked sooner.       Influenza A by PCR NEGATIVE NEGATIVE Final   Influenza B by PCR NEGATIVE NEGATIVE Final    Comment: (NOTE) The Xpert Xpress SARS-CoV-2/FLU/RSV plus assay is intended as an aid in the diagnosis of influenza from Nasopharyngeal swab specimens and should not be used as a sole basis for treatment. Nasal washings and aspirates are unacceptable for Xpert Xpress SARS-CoV-2/FLU/RSV testing.  Fact Sheet for Patients: BloggerCourse.com  Fact Sheet for Healthcare Providers: SeriousBroker.it  This test is not yet approved or cleared by the [place] FDA and has been authorized for detection and/or diagnosis of SARS-CoV-2 by FDA under an Emergency Use Authorization (EUA). This EUA will remain in effect (meaning this test can be used) for the duration of the COVID-19 declaration under Section 564(b)(1) of the Act, 21 U.S.C. section 360bbb-3(b)(1), unless the authorization is terminated or revoked.  Performed at [facility], [address] [place], [place] [address]   Blood Culture (routine x 2)     Status: None (Preliminary result)   Collection Time: 04/12/21  4:12 PM   Specimen: BLOOD  Result Value Ref Range Status   Specimen Description BLOOD BLOOD RIGHT WRIST  Final   Special Requests   Final    BOTTLES DRAWN AEROBIC AND ANAEROBIC Blood Culture adequate volume   Culture   Final    NO GROWTH 2 DAYS Performed at [facility], [address], [place], [place] [address]    Report Status PENDING  Incomplete  Blood Culture (routine x 2)     Status: None (Preliminary result)   Collection Time: 04/12/21  4:17 PM   Specimen: BLOOD  Result Value Ref Range Status   Specimen Description BLOOD LEFT ANTECUBITAL  Final   Special  Requests   Final    BOTTLES DRAWN AEROBIC AND ANAEROBIC Blood Culture adequate volume   Culture   Final    NO GROWTH 2 DAYS Performed at [facility], [address] [place], [place] [address]    Report Status PENDING  Incomplete    Anti-infectives:  Anti-infectives (From admission, onward)   Start     Dose/Rate Route Frequency Ordered Stop   04/12/21 1615  cefTRIAXone (ROCEPHIN) 2 g in sodium chloride 0.9 % 100 mL IVPB        2 g 200 mL/hr over 30 Minutes Intravenous Every 24 hours 04/12/21 1611     04/12/21 1615  azithromycin (ZITHROMAX) 500 mg in sodium chloride 0.9 % 250 mL IVPB        500 mg 250 mL/hr over 60 Minutes Intravenous Every 24 hours 04/12/21 1611          PAST MEDICAL HISTORY   Past Medical History:  Diagnosis Date  . Diabetes mellitus without complication (HCC)   . Glaucoma   . Hyperlipidemia   . Hypertension      SURGICAL HISTORY   Past Surgical History:  Procedure Laterality Date  . REPLACEMENT TOTAL KNEE BILATERAL Bilateral   . Skin grafts    . TOTAL SHOULDER REPLACEMENT Right      FAMILY HISTORY   Family History  Problem Relation Age of Onset  . Alzheimer's disease Father      SOCIAL HISTORY   Social History   Tobacco  Use  . Smoking status: Never Smoker  . Smokeless tobacco: Never Used  Substance Use Topics  . Alcohol use: Yes    Alcohol/week: 49.0 standard drinks    Types: 49 Cans of beer per week  . Drug use: No     MEDICATIONS   Current Medication:  Current Facility-Administered Medications:  .  0.9 %  sodium chloride infusion, 250 mL, Intravenous, Continuous, Rust-Chester, Britton L, NP, Last Rate: 5 mL/hr at 04/14/21 0800, Infusion Verify at 04/14/21 0800 .  azithromycin (ZITHROMAX) 500 mg in sodium chloride 0.9 % 250 mL IVPB, 500 mg, Intravenous, Q24H, Sharman Cheek, MD, Paused at 04/13/21 1936 .  cefTRIAXone (ROCEPHIN) 2 g in sodium chloride 0.9 % 100 mL IVPB, 2 g, Intravenous, Q24H, Sharman Cheek, MD, Paused at 04/13/21 1711 .  Chlorhexidine Gluconate Cloth 2 % PADS 6 each, 6 each, Topical, Q0600, Mosetta Pigeon, MD, 6 each at 04/13/21 0516 .  dexmedetomidine (PRECEDEX) 400 MCG/100ML (4 mcg/mL) infusion, 0.4-1.2 mcg/kg/hr, Intravenous, Titrated, Rust-Chester, Cecelia Byars, NP, Stopped at 04/12/21 2127 .  docusate sodium (COLACE) capsule 100 mg, 100 mg, Oral, BID PRN, Vida Rigger, MD .  folic acid (FOLVITE) tablet 1 mg, 1 mg, Oral, Daily, Rust-Chester, Britton L, NP .  heparin injection 1,000-6,000 Units, 1,000-6,000 Units, Intravenous, BID PRN, Rust-Chester, Britton L, NP, 2,800 Units at 04/13/21 0038 .  heparin injection 5,000 Units, 5,000 Units, Subcutaneous, Q8H, Vida Rigger, MD, 5,000 Units at 04/14/21 0532 .  LORazepam (ATIVAN) injection 0-4 mg, 0-4 mg, Intravenous, Q4H, 2 mg at 04/14/21 0826 **FOLLOWED BY** [START ON 04/15/2021] LORazepam (ATIVAN) injection 0-4 mg, 0-4 mg, Intravenous, Q8H, Rust-Chester, Britton L, NP .  LORazepam (ATIVAN) tablet 1-4 mg, 1-4 mg, Oral, Q1H PRN **OR** LORazepam (ATIVAN) injection 1-4 mg, 1-4 mg, Intravenous, Q1H PRN, Rust-Chester, Britton L, NP, 2 mg at 04/13/21 0653 .  multivitamin with minerals tablet 1 tablet, 1 tablet, Oral, Daily, Rust-Chester, Britton L, NP .  nicotine (NICODERM CQ - dosed in mg/24 hours) patch 14 mg, 14 mg, Transdermal, Daily, Karna Christmas, Jacey Eckerson, MD, 14 mg at 04/13/21 1839 .  norepinephrine (LEVOPHED) 16 mg in premix infusion, 0-40 mcg/min, Intravenous, Titrated, Rust-Chester, Cecelia Byars, NP, Stopped at 04/14/21 0206 .  polyethylene glycol (MIRALAX / GLYCOLAX) packet 17 g, 17 g, Oral, Daily PRN, Karna Christmas, Allayna Erlich, MD .  [COMPLETED] sodium chloride 0.9 % bolus 1,000 mL, 1,000 mL, Intravenous, Once, Stopped at 04/12/21 1707 **AND** sodium chloride 0.9 % bolus 1,000 mL, 1,000 mL, Intravenous, Once **AND** [COMPLETED] sodium chloride 0.9 % bolus 500 mL, 500 mL, Intravenous, Once, Sharman Cheek, MD, Stopped at 04/12/21  1918 .  thiamine tablet 100 mg, 100 mg, Oral, Daily **OR** thiamine (B-1) injection 100 mg, 100 mg, Intravenous, Daily, Rust-Chester, Britton L, NP, 100 mg at 04/13/21 1840    ALLERGIES   Patient has no known allergies.    REVIEW OF SYSTEMS     10 point ROS conducted exam except for hallucinations both auditory and visual.  PHYSICAL EXAMINATION   Vital Signs: Temp:  [98.6 F (37 C)-99.1 F (37.3 C)] 99.1 F (37.3 C) (05/01 0400) Pulse Rate:  [87-104] 98 (05/01 0800) Resp:  [10-23] 20 (05/01 0800) BP: (94-132)/(53-72) 109/66 (05/01 0800) SpO2:  [93 %-100 %] 98 % (05/01 0800) Weight:  [64.1 kg] 64.1 kg (05/01 0500)  GENERAL: Older than stated age, no apparent distress.  Becomes intermittently sedated post CIWA meds HEAD: Normocephalic, atraumatic.  EYES: Pupils equal, round, reactive to light.  No scleral icterus.  Positive  conjunctivitis  MOUTH: Moist mucosal membrane. NECK: Supple. No thyromegaly. No nodules. No JVD.  PULMONARY: Clear to auscultation with mild rhonchorous breath sounds bilaterally CARDIOVASCULAR: S1 and S2. Regular rate and rhythm. No murmurs, rubs, or gallops.  GASTROINTESTINAL: Soft, nontender, non-distended. No masses. Positive bowel sounds. No hepatosplenomegaly.  MUSCULOSKELETAL: No swelling, clubbing, or edema.  NEUROLOGIC: Altered sensorium with no focal deficit SKIN:intact,warm,dry   PERTINENT DATA     Infusions: . sodium chloride 5 mL/hr at 04/14/21 0800  . azithromycin Stopped (04/13/21 1936)  . cefTRIAXone (ROCEPHIN)  IV Stopped (04/13/21 1711)  . dexmedetomidine (PRECEDEX) IV infusion Stopped (04/12/21 2127)  . norepinephrine (LEVOPHED) Adult infusion Stopped (04/14/21 0206)  . sodium chloride     Scheduled Medications: . Chlorhexidine Gluconate Cloth  6 each Topical Q0600  . folic acid  1 mg Oral Daily  . heparin injection (subcutaneous)  5,000 Units Subcutaneous Q8H  . LORazepam  0-4 mg Intravenous Q4H   Followed by  . [START  ON 04/15/2021] LORazepam  0-4 mg Intravenous Q8H  . multivitamin with minerals  1 tablet Oral Daily  . nicotine  14 mg Transdermal Daily  . thiamine  100 mg Oral Daily   Or  . thiamine  100 mg Intravenous Daily   PRN Medications: docusate sodium, heparin, LORazepam **OR** LORazepam, polyethylene glycol Hemodynamic parameters:   Intake/Output: 04/30 0701 - 05/01 0700 In: 1003.7 [I.V.:453.7; IV Piggyback:550] Out: 2550 [Urine:2550]  Ventilator  Settings:    LAB RESULTS:  Basic Metabolic Panel: Recent Labs  Lab 04/12/21 1559 04/12/21 1845 04/12/21 2228 04/13/21 0457 04/14/21 0313  NA 137  --  139 138 147*  K 7.4*  --  5.4* 3.3* 4.2  CL 91*  --  100 97* 108  CO2 12*  --  13* 23 28  GLUCOSE 176*  --  174* 186* 138*  BUN 215*  --  266* 117* 75*  CREATININE 16.14*  --  13.80* 6.02* 1.50*  CALCIUM 9.1  --  8.5* 8.0* 8.5*  MG  --  2.9* 2.7* 2.1 2.0  PHOS  --  16.4* 13.7* 7.2* 3.7   Liver Function Tests: Recent Labs  Lab 04/12/21 1559 04/12/21 2228 04/13/21 0457  AST 10* 11* 13*  ALT 17 16 16   ALKPHOS 77 59 54  BILITOT 1.5* 1.2 1.4*  PROT 7.5 6.2* 5.6*  ALBUMIN 3.8 3.1* 2.8*   No results for input(s): LIPASE, AMYLASE in the last 168 hours. No results for input(s): AMMONIA in the last 168 hours. CBC: Recent Labs  Lab 04/12/21 1559 04/13/21 0457 04/14/21 0313  WBC 11.7* 6.1 4.9  NEUTROABS 10.7*  --   --   HGB 15.1 12.3* 13.4  HCT 42.0 33.9* 37.7*  MCV 89.2 88.5 90.8  PLT 207 126* 123*   Cardiac Enzymes: Recent Labs  Lab 04/12/21 1745  CKTOTAL 588*   BNP: Invalid input(s): POCBNP CBG: Recent Labs  Lab 04/13/21 1616 04/13/21 1959 04/14/21 0028 04/14/21 0314 04/14/21 0730  GLUCAP 149* 155* 129* 121* 133*       IMAGING RESULTS:  Imaging: CT HEAD WO CONTRAST  Result Date: 04/13/2021 CLINICAL DATA:  Diminished responsiveness. EXAM: CT HEAD WITHOUT CONTRAST TECHNIQUE: Contiguous axial images were obtained from the base of the skull through the  vertex without intravenous contrast. COMPARISON:  None. FINDINGS: Brain: The brain shows a normal appearance without evidence of malformation, atrophy, old or acute small or large vessel infarction, mass lesion, hemorrhage, hydrocephalus or extra-axial collection. Vascular: There is atherosclerotic calcification of  the major vessels at the base of the brain. Skull: Normal.  No traumatic finding.  No focal bone lesion. Sinuses/Orbits: Sinuses are clear. Orbits appear normal. Mastoids are clear. Other: None significant IMPRESSION: Normal examination for age. Electronically Signed   By: Paulina FusiMark  Shogry M.D.   On: 04/13/2021 14:30   US RENAL  Result Date: 04/13/2021 CLINICAL DATA:  Acute renal failure EXAM: RENAL / URINARY TRACT ULTRASOUND COMPLETE COMPARISON:  None. FINDINGS: Right Kidney: Renal measurements: 10.9 x 4.9 x 5.8 cm = volume: 163 mL. Echogenicity within normal limits. No mass or hydronephrosis visualized. Left Kidney: Renal measurements: 10.9 x 5.6 x 5.5 cm = volume: 178 mL. Echogenicity within normal limits. No mass or hydronephrosis visualized. Bladder: The bladder contains a Foley catheter. Despite Foley catheter, the bladder is filled with urine. Other: None. IMPRESSION: 1. The kidneys are normal in appearance. 2. Despite the presence of a Foley catheter, the bladder is filled with urine. Electronically Signed   By: Gerome Samavid  Williams III M.D   On: 04/13/2021 08:34   DG Chest Portable 1 View  Result Date: 04/12/2021 CLINICAL DATA:  Hemodialysis catheter placement. EXAM: PORTABLE CHEST 1 VIEW COMPARISON:  April 12, 2021 FINDINGS: A right internal jugular venous catheter is seen with its distal tip near the junction of the superior vena cava and right atrium. This represents a new finding when compared to the prior study. Decreased lung volumes are seen, likely secondary to the degree of patient inspiration. Subsequent crowding of the bronchovascular lung markings is noted. There is no evidence of  acute infiltrate, pleural effusion or pneumothorax. The heart size and mediastinal contours are within normal limits. An intact right shoulder replacement is seen. The visualized skeletal structures are otherwise unremarkable. IMPRESSION: 1. Right internal jugular venous catheter placement and positioning, as described above. Electronically Signed   By: Aram Candelahaddeus  Houston M.D.   On: 04/12/2021 21:36   DG Chest Port 1 View  Result Date: 04/12/2021 CLINICAL DATA:  Sepsis EXAM: PORTABLE CHEST 1 VIEW COMPARISON:  02/18/2017 FINDINGS: The heart size and mediastinal contours are within normal limits. Both lungs are clear. The visualized skeletal structures are unremarkable. IMPRESSION: No active disease. Electronically Signed   By: Elige KoHetal  Patel   On: 04/12/2021 17:31   @PROBHOSP @ CT HEAD WO CONTRAST  Result Date: 04/13/2021 CLINICAL DATA:  Diminished responsiveness. EXAM: CT HEAD WITHOUT CONTRAST TECHNIQUE: Contiguous axial images were obtained from the base of the skull through the vertex without intravenous contrast. COMPARISON:  None. FINDINGS: Brain: The brain shows a normal appearance without evidence of malformation, atrophy, old or acute small or large vessel infarction, mass lesion, hemorrhage, hydrocephalus or extra-axial collection. Vascular: There is atherosclerotic calcification of the major vessels at the base of the brain. Skull: Normal.  No traumatic finding.  No focal bone lesion. Sinuses/Orbits: Sinuses are clear. Orbits appear normal. Mastoids are clear. Other: None significant IMPRESSION: Normal examination for age. Electronically Signed   By: Paulina FusiMark  Shogry M.D.   On: 04/13/2021 14:30          ASSESSMENT AND PLAN    -Multidisciplinary rounds held today   Renal Failure-AKI stage V-              -resolved  - 04/14/21- Creat significantly imrpoved and UOP has increased -s/p emergent dialysis -follow UO -continue Foley Catheter-assess need daily -Nephrology team on case-will leave  riple-lumen dialysis catheter until Renal team asks to remove  Altered mental status with confusion-improved -Likely due to toxic metabolic encephalopathy  secondary to severe AKI -with Alcoholic encephalopathy and withdrawal and concomitant effects of benzo via CIWA   High anion gap metabolic acidosis   -Patient is not with hyperglycemia, suspect metabolic derangements and lactic acidosis   -Currently receiving sodium bicarb infusion and planning for emergent dialysis   Severe hyperkalemia -Repeat twelve-lead EKG -peaked T waves and PR prolongation noted - deliver 1amp calcium gloconate -emergent HD -Lokelma ordered  -Renal team on case appreciate input -pharmacy consultation   Chronic alcoholism - CIWA protocol -monitor for alcohol withdrawal syndrome -Ativan PRN ordered   Polysubstance abuse -Cocaine & tobacco history  -monitor for withdrawal -urine drug screen ordered -nicotine patch    Hx of right lower extermity osteosarcoma  - s/p AKA at age 74  -fall precautions while with altered sensorium  - may need 1:1 sitter  ID -continue IV abx as prescibed -follow up cultures  GI/Nutrition GI PROPHYLAXIS as indicated DIET-->TF's as tolerated Constipation protocol as indicated  ENDO - ICU hypoglycemic\Hyperglycemia protocol -check FSBS per protocol    DVT/GI PRX ordered -SCDs  TRANSFUSIONS AS NEEDED MONITOR FSBS ASSESS the need for LABS as needed   Critical care provider statement:    Critical care time (minutes):  33   Critical care time was exclusive of:  Separately billable procedures and treating other patients   Critical care was necessary to treat or prevent imminent or life-threatening deterioration of the following conditions:  AKI stage5, hx of osteosarcoma, alcoholism, polysubstance abuse, multiple comorbid conditions   Critical care was time spent personally by me on the following activities:  Development of treatment plan with patient or  surrogate, discussions with consultants, evaluation of patient's response to treatment, examination of patient, obtaining history from patient or surrogate, ordering and performing treatments and interventions, ordering and review of laboratory studies and re-evaluation of patient's condition.  I assumed direction of critical care for this patient from another provider in my specialty: no    This document was prepared using Dragon voice recognition software and may include unintentional dictation errors.    Vida Rigger, M.D.  Division of Pulmonary & Critical Care Medicine  [facility]

## 2021-04-14 NOTE — Progress Notes (Addendum)
Received pt from the ICU.  Pt asleep and skin assessment done with not notation of wounds or lacerations.  Pt noted with foley catheter intact and drainging urine.  Pt is also noted to have a RAKA.  Pt is placed on the monitor and verifed with the CCMD BOX 13.Thayer Ohm from the icu was the  2nd verifier

## 2021-04-14 NOTE — Progress Notes (Signed)
[facility]  ROUNDING NOTE   Subjective:   Patient still quite lethargic. BUN and creatinine improved and currently down to 75/1.5. Patient making good urine. Patient's brother at bedside today.   Objective:  Vital signs in last 24 hours:  Temp:  [97.4 F (36.3 C)-99.1 F (37.3 C)] 97.4 F (36.3 C) (05/01 1929) Pulse Rate:  [70-104] 94 (05/01 1929) Resp:  [13-21] 20 (05/01 1929) BP: (97-153)/(62-94) 153/94 (05/01 1929) SpO2:  [93 %-100 %] 100 % (05/01 1929) Weight:  [64.1 kg] 64.1 kg (05/01 0500)  Weight change: -17.5 kg Filed Weights   04/12/21 1609 04/13/21 0504 04/14/21 0500  Weight: 81.6 kg 69.5 kg 64.1 kg    Intake/Output: I/O last 3 completed shifts: In: 1022.6 [I.V.:472.6; IV Piggyback:550] Out: 3750 [Urine:3750]   Intake/Output this shift:  Total I/O In: 0  Out: 250 [Urine:250]  Physical Exam: General:  No acute distress  Head:  Normocephalic, atraumatic. Moist oral mucosal membranes  Eyes:  Anicteric  Neck:  Supple  Lungs:   Clear to auscultation, normal effort  Heart:  S1S2 no rubs  Abdomen:   Soft, nontender, bowel sounds present  Extremities:  Right AKA, no lower extremity edema  Neurologic:  Lethargic but arousable  Skin:  No acute skin rash  Access:  Right IJ temporary dialysis catheter    Basic Metabolic Panel: Recent Labs  Lab 04/12/21 1559 04/12/21 1845 04/12/21 2228 04/13/21 0457 04/14/21 0313  NA 137  --  139 138 147*  K 7.4*  --  5.4* 3.3* 4.2  CL 91*  --  100 97* 108  CO2 12*  --  13* 23 28  GLUCOSE 176*  --  174* 186* 138*  BUN 215*  --  266* 117* 75*  CREATININE 16.14*  --  13.80* 6.02* 1.50*  CALCIUM 9.1  --  8.5* 8.0* 8.5*  MG  --  2.9* 2.7* 2.1 2.0  PHOS  --  16.4* 13.7* 7.2* 3.7    Liver Function Tests: Recent Labs  Lab 04/12/21 1559 04/12/21 2228 04/13/21 0457  AST 10* 11* 13*  ALT 17 16 16   ALKPHOS 77 59 54  BILITOT 1.5* 1.2 1.4*  PROT 7.5 6.2* 5.6*  ALBUMIN 3.8 3.1* 2.8*   No results for  input(s): LIPASE, AMYLASE in the last 168 hours. No results for input(s): AMMONIA in the last 168 hours.  CBC: Recent Labs  Lab 04/12/21 1559 04/13/21 0457 04/14/21 0313  WBC 11.7* 6.1 4.9  NEUTROABS 10.7*  --   --   HGB 15.1 12.3* 13.4  HCT 42.0 33.9* 37.7*  MCV 89.2 88.5 90.8  PLT 207 126* 123*    Cardiac Enzymes: Recent Labs  Lab 04/12/21 1745  CKTOTAL 588*    BNP: Invalid input(s): POCBNP  CBG: Recent Labs  Lab 04/14/21 0730 04/14/21 1145 04/14/21 1253 04/14/21 1618 04/14/21 2044  GLUCAP 133* 152* 140* 174* 169*    Microbiology: Results for orders placed or performed during the hospital encounter of 04/12/21  Urine culture     Status: Abnormal   Collection Time: 04/12/21  3:45 PM   Specimen: In/Out Cath Urine  Result Value Ref Range Status   Specimen Description   Final    IN/OUT CATH URINE Performed at [facility], [address], [place], [place]    Special Requests   Final    NONE Performed at [facility], [address], [place], [place]    Culture MULTIPLE SPECIES PRESENT, SUGGEST RECOLLECTION (  A)  Final   Report Status 04/14/2021 FINAL  Final  Resp Panel by RT-PCR (Flu A&B, Covid) Nasopharyngeal Swab     Status: None   Collection Time: 04/12/21  4:12 PM   Specimen: Nasopharyngeal Swab; Nasopharyngeal(NP) swabs in vial transport medium  Result Value Ref Range Status   SARS Coronavirus 2 by RT PCR NEGATIVE NEGATIVE Final    Comment: (NOTE) SARS-CoV-2 target nucleic acids are NOT DETECTED.  The SARS-CoV-2 RNA is generally detectable in upper respiratory specimens during the acute phase of infection. The lowest concentration of SARS-CoV-2 viral copies this assay can detect is 138 copies/mL. A negative result does not preclude SARS-Cov-2 infection and should not be used as the sole basis for treatment or other patient management decisions. A negative result may occur with  improper specimen  collection/handling, submission of specimen other than nasopharyngeal swab, presence of viral mutation(s) within the areas targeted by this assay, and inadequate number of viral copies(<138 copies/mL). A negative result must be combined with clinical observations, patient history, and epidemiological information. The expected result is Negative.  Fact Sheet for Patients:  BloggerCourse.com  Fact Sheet for Healthcare Providers:  SeriousBroker.it  This test is no t yet approved or cleared by the [place] FDA and  has been authorized for detection and/or diagnosis of SARS-CoV-2 by FDA under an Emergency Use Authorization (EUA). This EUA will remain  in effect (meaning this test can be used) for the duration of the COVID-19 declaration under Section 564(b)(1) of the Act, 21 U.S.C.section 360bbb-3(b)(1), unless the authorization is terminated  or revoked sooner.       Influenza A by PCR NEGATIVE NEGATIVE Final   Influenza B by PCR NEGATIVE NEGATIVE Final    Comment: (NOTE) The Xpert Xpress SARS-CoV-2/FLU/RSV plus assay is intended as an aid in the diagnosis of influenza from Nasopharyngeal swab specimens and should not be used as a sole basis for treatment. Nasal washings and aspirates are unacceptable for Xpert Xpress SARS-CoV-2/FLU/RSV testing.  Fact Sheet for Patients: BloggerCourse.com  Fact Sheet for Healthcare Providers: SeriousBroker.it  This test is not yet approved or cleared by the [place] FDA and has been authorized for detection and/or diagnosis of SARS-CoV-2 by FDA under an Emergency Use Authorization (EUA). This EUA will remain in effect (meaning this test can be used) for the duration of the COVID-19 declaration under Section 564(b)(1) of the Act, 21 U.S.C. section 360bbb-3(b)(1), unless the authorization is terminated or revoked.  Performed at [facility], [address] [place], [place] [address]   Blood Culture (routine x 2)     Status: None (Preliminary result)   Collection Time: 04/12/21  4:12 PM   Specimen: BLOOD  Result Value Ref Range Status   Specimen Description BLOOD BLOOD RIGHT WRIST  Final   Special Requests   Final    BOTTLES DRAWN AEROBIC AND ANAEROBIC Blood Culture adequate volume   Culture   Final    NO GROWTH 2 DAYS Performed at [facility], [address] [place], [place] [address]    Report Status PENDING  Incomplete  Blood Culture (routine x 2)     Status: None (Preliminary result)   Collection Time: 04/12/21  4:17 PM   Specimen: BLOOD  Result Value Ref Range Status   Specimen Description BLOOD LEFT ANTECUBITAL  Final   Special Requests   Final    BOTTLES DRAWN AEROBIC AND ANAEROBIC Blood Culture adequate volume   Culture   Final  NO GROWTH 2 DAYS Performed at [facility], [address] [place], [place] [address]    Report Status PENDING  Incomplete    Coagulation Studies: Recent Labs    04/12/21 1559  LABPROT 15.4*  INR 1.2    Urinalysis: Recent Labs    04/12/21 1545  COLORURINE AMBER*  LABSPEC 1.018  PHURINE 5.0  GLUCOSEU 50*  HGBUR SMALL*  BILIRUBINUR NEGATIVE  KETONESUR 5*  PROTEINUR NEGATIVE  NITRITE NEGATIVE  LEUKOCYTESUR NEGATIVE      Imaging: CT HEAD WO CONTRAST  Result Date: 04/13/2021 CLINICAL DATA:  Diminished responsiveness. EXAM: CT HEAD WITHOUT CONTRAST TECHNIQUE: Contiguous axial images were obtained from the base of the skull through the vertex without intravenous contrast. COMPARISON:  None. FINDINGS: Brain: The brain shows a normal appearance without evidence of malformation, atrophy, old or acute small or large vessel infarction, mass lesion, hemorrhage, hydrocephalus or extra-axial collection. Vascular: There is atherosclerotic calcification of the major vessels at the base of the brain. Skull: Normal.  No traumatic finding.  No  focal bone lesion. Sinuses/Orbits: Sinuses are clear. Orbits appear normal. Mastoids are clear. Other: None significant IMPRESSION: Normal examination for age. Electronically Signed   By: Paulina Fusi M.D.   On: 04/13/2021 14:30   US RENAL  Result Date: 04/13/2021 CLINICAL DATA:  Acute renal failure EXAM: RENAL / URINARY TRACT ULTRASOUND COMPLETE COMPARISON:  None. FINDINGS: Right Kidney: Renal measurements: 10.9 x 4.9 x 5.8 cm = volume: 163 mL. Echogenicity within normal limits. No mass or hydronephrosis visualized. Left Kidney: Renal measurements: 10.9 x 5.6 x 5.5 cm = volume: 178 mL. Echogenicity within normal limits. No mass or hydronephrosis visualized. Bladder: The bladder contains a Foley catheter. Despite Foley catheter, the bladder is filled with urine. Other: None. IMPRESSION: 1. The kidneys are normal in appearance. 2. Despite the presence of a Foley catheter, the bladder is filled with urine. Electronically Signed   By: Gerome Sam III M.D   On: 04/13/2021 08:34     Medications:   . sodium chloride Stopped (04/14/21 1611)   . Chlorhexidine Gluconate Cloth  6 each Topical Q0600  . folic acid  1 mg Oral Daily  . heparin injection (subcutaneous)  5,000 Units Subcutaneous Q8H  . LORazepam  0-4 mg Intravenous Q4H   Followed by  . [START ON 04/15/2021] LORazepam  0-4 mg Intravenous Q8H  . multivitamin with minerals  1 tablet Oral Daily  . nicotine  14 mg Transdermal Daily  . thiamine  100 mg Oral Daily   Or  . thiamine  100 mg Intravenous Daily   docusate sodium, heparin, LORazepam **OR** LORazepam, polyethylene glycol  Assessment/ Plan:  60 y.o. male with past medical history of peripheral vascular disease status post right AKA, diabetes mellitus type 2, hypertension, hyperlipidemia, alcohol abuse who presented with severe acute kidney injury, hyperkalemia, metabolic acidosis.  1.  Acute kidney injury secondary to severe volume depletion. 2.  Severe hyperkalemia upon admission,  improved with dialysis. 3.  Metabolic acidosis. 4.  Hypotension.  Plan: Patient had 1 dialysis session upon admission.  No further need for dialysis at this time.  Hyperkalemia significantly improved as his metabolic acidosis.  BUN and creatinine also trending down.  Continue to monitor metabolic parameters at this time.   LOS: 2 Dorianna Mckiver 5/1/202210:09 PM

## 2021-04-14 NOTE — Plan of Care (Signed)

## 2021-04-15 LAB — BASIC METABOLIC PANEL
Anion gap: 15 (ref 5–15)
Anion gap: 13 (ref 5–15)
Anion gap: 12 (ref 5–15)
BUN: 52 mg/dL — ABNORMAL HIGH (ref 6–20)
BUN: 45 mg/dL — ABNORMAL HIGH (ref 6–20)
BUN: 41 mg/dL — ABNORMAL HIGH (ref 6–20)
CO2: 26 mmol/L (ref 22–32)
CO2: 27 mmol/L (ref 22–32)
CO2: 29 mmol/L (ref 22–32)
Calcium: 9.3 mg/dL (ref 8.9–10.3)
Calcium: 9.1 mg/dL (ref 8.9–10.3)
Calcium: 9.2 mg/dL (ref 8.9–10.3)
Chloride: 114 mmol/L — ABNORMAL HIGH (ref 98–111)
Chloride: 116 mmol/L — ABNORMAL HIGH (ref 98–111)
Chloride: 114 mmol/L — ABNORMAL HIGH (ref 98–111)
Creatinine, Ser: 1.04 mg/dL (ref 0.61–1.24)
Creatinine, Ser: 0.89 mg/dL (ref 0.61–1.24)
Creatinine, Ser: 0.83 mg/dL (ref 0.61–1.24)
GFR, Estimated: >60 mL/min (ref >60)
GFR, Estimated: >60 mL/min (ref >60)
GFR, Estimated: >60 mL/min (ref >60)
Glucose, Bld: 175 mg/dL — ABNORMAL HIGH (ref 70–99)
Glucose, Bld: 171 mg/dL — ABNORMAL HIGH (ref 70–99)
Glucose, Bld: 219 mg/dL — ABNORMAL HIGH (ref 70–99)
Potassium: 4.3 mmol/L (ref 3.5–5.1)
Potassium: 3.7 mmol/L (ref 3.5–5.1)
Potassium: 3.8 mmol/L (ref 3.5–5.1)
Sodium: 155 mmol/L — ABNORMAL HIGH (ref 135–145)
Sodium: 156 mmol/L — ABNORMAL HIGH (ref 135–145)
Sodium: 155 mmol/L — ABNORMAL HIGH (ref 135–145)

## 2021-04-15 LAB — CBC
HCT: 44.1 % (ref 39.0–52.0)
Hemoglobin: 14.8 g/dL (ref 13.0–17.0)
MCH: 31.5 pg (ref 26.0–34.0)
MCHC: 33.6 g/dL (ref 30.0–36.0)
MCV: 93.8 fL (ref 80.0–100.0)
Platelets: 126 10*3/uL — ABNORMAL LOW (ref 150–400)
RBC: 4.7 MIL/uL (ref 4.22–5.81)
RDW: 12.7 % (ref 11.5–15.5)
WBC: 4.6 10*3/uL (ref 4.0–10.5)
nRBC: 0 % (ref 0.0–0.2)

## 2021-04-15 LAB — RENAL FUNCTION PANEL
Albumin: 3.8 g/dL (ref 3.5–5.0)
Anion gap: 14 (ref 5–15)
BUN: 53 mg/dL — ABNORMAL HIGH (ref 6–20)
CO2: 28 mmol/L (ref 22–32)
Calcium: 9.5 mg/dL (ref 8.9–10.3)
Chloride: 115 mmol/L — ABNORMAL HIGH (ref 98–111)
Creatinine, Ser: 1.04 mg/dL (ref 0.61–1.24)
GFR, Estimated: >60 mL/min (ref >60)
Glucose, Bld: 180 mg/dL — ABNORMAL HIGH (ref 70–99)
Phosphorus: 1.8 mg/dL — ABNORMAL LOW (ref 2.5–4.6)
Potassium: 4.3 mmol/L (ref 3.5–5.1)
Sodium: 157 mmol/L — ABNORMAL HIGH (ref 135–145)

## 2021-04-15 LAB — GLUCOSE, CAPILLARY
Glucose-Capillary: 150 mg/dL — ABNORMAL HIGH (ref 70–99)
Glucose-Capillary: 155 mg/dL — ABNORMAL HIGH (ref 70–99)
Glucose-Capillary: 155 mg/dL — ABNORMAL HIGH (ref 70–99)
Glucose-Capillary: 158 mg/dL — ABNORMAL HIGH (ref 70–99)
Glucose-Capillary: 182 mg/dL — ABNORMAL HIGH (ref 70–99)
Glucose-Capillary: 207 mg/dL — ABNORMAL HIGH (ref 70–99)
Glucose-Capillary: 219 mg/dL — ABNORMAL HIGH (ref 70–99)
Glucose-Capillary: 51 mg/dL — ABNORMAL LOW (ref 70–99)

## 2021-04-15 LAB — MAGNESIUM: Magnesium: 2.1 mg/dL (ref 1.7–2.4)

## 2021-04-15 MED ORDER — SODIUM CHLORIDE 0.45 % IV SOLN: INTRAVENOUS | Status: DC

## 2021-04-15 MED ORDER — DEXTROSE 5 % IV SOLN: INTRAVENOUS | Status: DC

## 2021-04-15 MED ORDER — DORZOLAMIDE HCL-TIMOLOL MAL 2-0.5 % OP SOLN
1.0000 [drp] | Freq: Two times a day (BID) | OPHTHALMIC | Status: DC
  Administered 2021-04-15 – 2021-04-20 (×11): 1 [drp] via OPHTHALMIC
  Filled 2021-04-15: qty 10

## 2021-04-15 MED ORDER — MORPHINE SULFATE (PF) 2 MG/ML IV SOLN
2.0000 mg | INTRAVENOUS | Status: AC | PRN
Start: 2021-04-15 — End: 2021-04-15
  Administered 2021-04-15 (×2): 2 mg via INTRAVENOUS
  Filled 2021-04-15 (×2): qty 1

## 2021-04-15 MED ORDER — POTASSIUM & SODIUM PHOSPHATES 280-160-250 MG PO PACK
2.0000 | PACK | ORAL | Status: AC
  Filled 2021-04-15 (×2): qty 2

## 2021-04-15 MED ORDER — LABETALOL HCL 5 MG/ML IV SOLN
20.0000 mg | INTRAVENOUS | Status: DC | PRN
  Administered 2021-04-15 – 2021-04-16 (×5): 20 mg via INTRAVENOUS
  Filled 2021-04-15 (×5): qty 4

## 2021-04-15 MED ORDER — MORPHINE SULFATE (PF) 2 MG/ML IV SOLN
2.0000 mg | INTRAVENOUS | Status: DC | PRN
  Administered 2021-04-15 – 2021-04-16 (×4): 2 mg via INTRAVENOUS
  Filled 2021-04-15 (×4): qty 1

## 2021-04-15 MED ORDER — SODIUM CHLORIDE 0.45 % IV BOLUS
500.0000 mL | Freq: Once | INTRAVENOUS | Status: AC
  Administered 2021-04-15: 10:00:00 500 mL via INTRAVENOUS

## 2021-04-15 NOTE — Progress Notes (Signed)
[facility]  ROUNDING NOTE   Subjective:   Patient awake and moving all four extremities BUN/creatinine improved to 53/1.04. Patient making good urine, 1.9L Patient safety sitter at bedsdie   Objective:  Vital signs in last 24 hours:  Temp:  [97.4 F (36.3 C)-98.5 F (36.9 C)] 98.5 F (36.9 C) (05/02 0356) Pulse Rate:  [70-110] 98 (05/02 0735) Resp:  [15-20] 17 (05/02 0735) BP: (116-168)/(68-115) 164/100 (05/02 0735) SpO2:  [95 %-100 %] 95 % (05/02 0356) Weight:  [64.3 kg] 64.3 kg (05/02 0500)  Weight change: 0.2 kg Filed Weights   04/13/21 0504 04/14/21 0500 04/15/21 0500  Weight: 69.5 kg 64.1 kg 64.3 kg    Intake/Output: I/O last 3 completed shifts: In: 479.5 [I.V.:228.7; IV Piggyback:250.8] Out: 2835 [Urine:2835]   Intake/Output this shift:  No intake/output data recorded.  Physical Exam: General:  No acute distress  Head:  Normocephalic, atraumatic. Moist oral mucosal membranes  Eyes:  Anicteric  Lungs:   Clear to auscultation, normal effort  Heart:  S1S2 no rubs  Abdomen:   Soft, nontender, bowel sounds present  Extremities:  Right AKA, no lower extremity edema  Neurologic:  Alert, unable to answer simple questions  Skin:  No acute skin rash  Access:  Right IJ temporary dialysis catheter    Basic Metabolic Panel: Recent Labs  Lab 04/12/21 1559 04/12/21 1845 04/12/21 2228 04/13/21 0457 04/14/21 0313 04/15/21 0758  NA 137  --  139 138 147* 157*  K 7.4*  --  5.4* 3.3* 4.2 4.3  CL 91*  --  100 97* 108 115*  CO2 12*  --  13* 23 28 28   GLUCOSE 176*  --  174* 186* 138* 180*  BUN 215*  --  266* 117* 75* 53*  CREATININE 16.14*  --  13.80* 6.02* 1.50* 1.04  CALCIUM 9.1  --  8.5* 8.0* 8.5* 9.5  MG  --  2.9* 2.7* 2.1 2.0  --   PHOS  --  16.4* 13.7* 7.2* 3.7 1.8*    Liver Function Tests: Recent Labs  Lab 04/12/21 1559 04/12/21 2228 04/13/21 0457 04/15/21 0758  AST 10* 11* 13*  --   ALT 17 16 16   --   ALKPHOS 77 59 54  --   BILITOT  1.5* 1.2 1.4*  --   PROT 7.5 6.2* 5.6*  --   ALBUMIN 3.8 3.1* 2.8* 3.8   No results for input(s): LIPASE, AMYLASE in the last 168 hours. No results for input(s): AMMONIA in the last 168 hours.  CBC: Recent Labs  Lab 04/12/21 1559 04/13/21 0457 04/14/21 0313 04/15/21 0758  WBC 11.7* 6.1 4.9 4.6  NEUTROABS 10.7*  --   --   --   HGB 15.1 12.3* 13.4 14.8  HCT 42.0 33.9* 37.7* 44.1  MCV 89.2 88.5 90.8 93.8  PLT 207 126* 123* 126*    Cardiac Enzymes: Recent Labs  Lab 04/12/21 1745  CKTOTAL 588*    BNP: Invalid input(s): POCBNP  CBG: Recent Labs  Lab 04/14/21 1618 04/14/21 2044 04/15/21 0027 04/15/21 0347 04/15/21 0733  GLUCAP 174* 169* 182* 155* 158*    Microbiology: Results for orders placed or performed during the hospital encounter of 04/12/21  Urine culture     Status: Abnormal   Collection Time: 04/12/21  3:45 PM   Specimen: In/Out Cath Urine  Result Value Ref Range Status   Specimen Description   Final    IN/OUT CATH URINE Performed at [facility], [address]  [place], [place] [address]    Special Requests   Final    NONE Performed at [facility], [address] [place], [place] [address]    Culture MULTIPLE SPECIES PRESENT, SUGGEST RECOLLECTION (A)  Final   Report Status 04/14/2021 FINAL  Final  Resp Panel by RT-PCR (Flu A&B, Covid) Nasopharyngeal Swab     Status: None   Collection Time: 04/12/21  4:12 PM   Specimen: Nasopharyngeal Swab; Nasopharyngeal(NP) swabs in vial transport medium  Result Value Ref Range Status   SARS Coronavirus 2 by RT PCR NEGATIVE NEGATIVE Final    Comment: (NOTE) SARS-CoV-2 target nucleic acids are NOT DETECTED.  The SARS-CoV-2 RNA is generally detectable in upper respiratory specimens during the acute phase of infection. The lowest concentration of SARS-CoV-2 viral copies this assay can detect is 138 copies/mL. A negative result does not preclude SARS-Cov-2 infection and should not be used  as the sole basis for treatment or other patient management decisions. A negative result may occur with  improper specimen collection/handling, submission of specimen other than nasopharyngeal swab, presence of viral mutation(s) within the areas targeted by this assay, and inadequate number of viral copies(<138 copies/mL). A negative result must be combined with clinical observations, patient history, and epidemiological information. The expected result is Negative.  Fact Sheet for Patients:  BloggerCourse.com  Fact Sheet for Healthcare Providers:  SeriousBroker.it  This test is no t yet approved or cleared by the [place] FDA and  has been authorized for detection and/or diagnosis of SARS-CoV-2 by FDA under an Emergency Use Authorization (EUA). This EUA will remain  in effect (meaning this test can be used) for the duration of the COVID-19 declaration under Section 564(b)(1) of the Act, 21 U.S.C.section 360bbb-3(b)(1), unless the authorization is terminated  or revoked sooner.       Influenza A by PCR NEGATIVE NEGATIVE Final   Influenza B by PCR NEGATIVE NEGATIVE Final    Comment: (NOTE) The Xpert Xpress SARS-CoV-2/FLU/RSV plus assay is intended as an aid in the diagnosis of influenza from Nasopharyngeal swab specimens and should not be used as a sole basis for treatment. Nasal washings and aspirates are unacceptable for Xpert Xpress SARS-CoV-2/FLU/RSV testing.  Fact Sheet for Patients: BloggerCourse.com  Fact Sheet for Healthcare Providers: SeriousBroker.it  This test is not yet approved or cleared by the [place] FDA and has been authorized for detection and/or diagnosis of SARS-CoV-2 by FDA under an Emergency Use Authorization (EUA). This EUA will remain in effect (meaning this test can be used) for the duration of the COVID-19 declaration under Section 564(b)(1)  of the Act, 21 U.S.C. section 360bbb-3(b)(1), unless the authorization is terminated or revoked.  Performed at [facility], [address] [place], [place] [address]   Blood Culture (routine x 2)     Status: None (Preliminary result)   Collection Time: 04/12/21  4:12 PM   Specimen: BLOOD  Result Value Ref Range Status   Specimen Description BLOOD BLOOD RIGHT WRIST  Final   Special Requests   Final    BOTTLES DRAWN AEROBIC AND ANAEROBIC Blood Culture adequate volume   Culture   Final    NO GROWTH 3 DAYS Performed at [facility], [address], [place], [place] [address]    Report Status PENDING  Incomplete  Blood Culture (routine x 2)     Status: None (Preliminary result)   Collection Time: 04/12/21  4:17 PM   Specimen: BLOOD  Result Value Ref Range Status  Specimen Description BLOOD LEFT ANTECUBITAL  Final   Special Requests   Final    BOTTLES DRAWN AEROBIC AND ANAEROBIC Blood Culture adequate volume   Culture   Final    NO GROWTH 3 DAYS Performed at [facility], [address] [place], [place] [address]    Report Status PENDING  Incomplete    Coagulation Studies: Recent Labs    04/12/21 1559  LABPROT 15.4*  INR 1.2    Urinalysis: Recent Labs    04/12/21 1545  COLORURINE AMBER*  LABSPEC 1.018  PHURINE 5.0  GLUCOSEU 50*  HGBUR SMALL*  BILIRUBINUR NEGATIVE  KETONESUR 5*  PROTEINUR NEGATIVE  NITRITE NEGATIVE  LEUKOCYTESUR NEGATIVE      Imaging: CT HEAD WO CONTRAST  Result Date: 04/13/2021 CLINICAL DATA:  Diminished responsiveness. EXAM: CT HEAD WITHOUT CONTRAST TECHNIQUE: Contiguous axial images were obtained from the base of the skull through the vertex without intravenous contrast. COMPARISON:  None. FINDINGS: Brain: The brain shows a normal appearance without evidence of malformation, atrophy, old or acute small or large vessel infarction, mass lesion, hemorrhage, hydrocephalus or extra-axial collection. Vascular: There  is atherosclerotic calcification of the major vessels at the base of the brain. Skull: Normal.  No traumatic finding.  No focal bone lesion. Sinuses/Orbits: Sinuses are clear. Orbits appear normal. Mastoids are clear. Other: None significant IMPRESSION: Normal examination for age. Electronically Signed   By: Paulina Fusi M.D.   On: 04/13/2021 14:30     Medications:   . sodium chloride    . sodium chloride Stopped (04/14/21 1611)   . Chlorhexidine Gluconate Cloth  6 each Topical Q0600  . folic acid  1 mg Oral Daily  . heparin injection (subcutaneous)  5,000 Units Subcutaneous Q8H  . LORazepam  0-4 mg Intravenous Q8H  . multivitamin with minerals  1 tablet Oral Daily  . nicotine  14 mg Transdermal Daily  . thiamine  100 mg Oral Daily   Or  . thiamine  100 mg Intravenous Daily   docusate sodium, heparin, LORazepam **OR** LORazepam, polyethylene glycol  Assessment/ Plan:  60 y.o. male with past medical history of peripheral vascular disease status post right AKA, diabetes mellitus type 2, hypertension, hyperlipidemia, alcohol abuse who presented with severe acute kidney injury, hyperkalemia, metabolic acidosis.  1.  Acute kidney injury secondary to severe volume depletion. 2.  Severe hyperkalemia upon admission, improved with dialysis. 3.  Metabolic acidosis. 4.  Hypotension. 5. Hypernatremia  Plan: Patient had 1 dialysis session upon admission.   No need for dialysis at this time  Hyperkalemia improved with his metabolic acidosis.  BUN and creatinine also improving. Sodium increased to 157, 1/2 NS bolus ordered by primary team along with continuous infusion.  Will monitor metabolic parameters.  Will evaluate IVF and consider D5 if needed.  .   LOS: 3   5/2/202211:11 AM

## 2021-04-15 NOTE — Plan of Care (Signed)

## 2021-04-15 NOTE — Consult Note (Signed)
PHARMACY CONSULT NOTE  Pharmacy Consult for Electrolyte Monitoring and Replacement   Recent Labs: Potassium (mmol/L)  Date Value  04/15/2021 4.3  04/15/2021 4.3   Magnesium (mg/dL)  Date Value  59/56/3875 2.1   Calcium (mg/dL)  Date Value  64/33/2951 9.5  04/15/2021 9.3   Albumin (g/dL)  Date Value  88/41/6606 3.8   Phosphorus (mg/dL)  Date Value  30/16/0109 1.8 (L)   Sodium (mmol/L)  Date Value  04/15/2021 157 (H)  04/15/2021 155 (H)   Assessment: Patient w/ severe AKI/hyperkalemia and admitted to the medical intensive care unit 4/29 for requirement of emergent dialysis. Patient with severe hyperkalemia ( K 7.9), acodisos (CO2 12) and uremia (BUN 215)  Patient was seen by nephrology and plan for emergent HD overnight  Renal function markedly improved. 1940 mL UOP yesterday.  Goal of Therapy:  Electrolytes WNL   Plan:  --Na 157, started on 1/2NS@100ml /hr. Nephrology and Primary team following. --Phos 1.8. Phos-Nak PO x 2 doses ordered --Follow-up electrolytes with AM labs tomorrow  Bettey Costa 04/15/2021 1:46 PM

## 2021-04-15 NOTE — Progress Notes (Signed)
Pt seen every 30 minutes through the course of the night. CIWA score between 8-10 and was medicated with Ativan every 4 hours according to CIWA protocol. Mouth care provided. Patient had 2 bowel movements and incontinent care provided. Pt is oriented to self only. Call bell placed within reach. Bed alarm activated. Will continue to monitor.

## 2021-04-15 NOTE — Progress Notes (Signed)
PROGRESS NOTE  Isaac Morrison  ZOX:[id number] DOB: [birth year] DOA: 04/12/2021 PCP: Dan MakerFleming, Ron D, MD   Brief Narrative: Isaac AlertBilly Morrison is a 60 y.o. male with a history of childhood osteosarcoma s/p AKA, lifelong tobacco use, alcoholism, T2DM, HLD, HTN who presented to the ED 4/29 with 1 week of worsening delirium found to have severe renal failure with hyperkalemia and acidosis. He was admitted to the ICU, underwent HD with improvement in metabolic parameters. Mentation has remained severely altered and ativan has been given for alcohol withdrawal. He was transferred to the medical floor 5/1 on hospitalist service as of 5/2. Sodium has trended up to 157 for which hypotonic IV fluids are ordered. Nephrology is on board.   Assessment & Plan: Active Problems:   Acute renal failure (HCC)  Acute renal failure: Resolved. UOP improved. s/p emergent dialysis 4/30.  - Monitor I/O - Continue foley for monitoring - Nephrology following. No HD required today, still has central line  Hypernatremia: Severe and progressive.  - Hypotonic saline bolus and then infusion, monitor metabolic panel q4h. Correction not to exceed 0.5 mEq/L/hr   Hyperkalemia: Resolved with HD. Stable.   High anion gap metabolic acidosis, lactic acidosis:  - Monitoring metabolic panel as above  Acute toxic metabolic encephalopathy: Due to alcohol withdrawal as well as metabolic derangements.  - CIWA - Fluids as above - prn dialysis  Polysubstance abuse, tobacco use:  - Nicotine patch  History of osteosarcoma s/p right AKA at age 60.   Leukocytosis: At admission, resolved, afebrile. Received CTX 4/29, 4/30, stopped with nonclonal UCx and negative BCx.  GERD:  - Hold PPI while NPO. Keep NPO until mentation clears.  DVT prophylaxis: Heparin Code Status: Full Family Communication: None at bedside Disposition Plan:  Status is: Inpatient  Remains inpatient appropriate because:Persistent severe electrolyte disturbances  and Altered mental status   Dispo: The patient is from: Home              Anticipated d/c is to: Home              Patient currently is not medically stable to d/c.   Difficult to place patient No  Consultants:   Nephrology  PCCM  Procedures:   Dialysis 4/29-4/30  Antimicrobials:  Ceftriaxone x2   Subjective: Confused, not verbally responsive, just got cleaned up in bed. Grimaces intermittently.  Objective: Vitals:   04/15/21 0356 04/15/21 0500 04/15/21 0735 04/15/21 1133  BP: (!) 168/98  (!) 164/100 (!) 155/95  Pulse: 97  98 97  Resp: 20  17 17   Temp: 98.5 F (36.9 C)   97.8 F (36.6 C)  TempSrc:      SpO2: 95%   100%  Weight:  64.3 kg    Height:        Intake/Output Summary (Last 24 hours) at 04/15/2021 1200 Last data filed at 04/15/2021 0401 Gross per 24 hour  Intake 8.86 ml  Output 1650 ml  Net -1641.14 ml   Filed Weights   04/13/21 0504 04/14/21 0500 04/15/21 0500  Weight: 69.5 kg 64.1 kg 64.3 kg    Gen: 60 y.o. male in no distress Pulm: Non-labored breathing. Clear to auscultation bilaterally.  CV: Regular rate and rhythm. No murmur, rub, or gallop. No JVD, no pitting LLE edema. GI: Abdomen soft, scaphoid, non-tender, non-distended, with normoactive bowel sounds. No organomegaly or masses felt. Ext: Warm, Right AKA wnl. Mittens on bilaterally. Skin: No rashes, lesions or ulcers on visualized skin Neuro: Not cooperative with exam but moves  all extremities, follows some commands. Psych: Judgement and insight are impaired.  Data Reviewed: I have personally reviewed following labs and imaging studies  CBC: Recent Labs  Lab 04/12/21 1559 04/13/21 0457 04/14/21 0313 04/15/21 0758  WBC 11.7* 6.1 4.9 4.6  NEUTROABS 10.7*  --   --   --   HGB 15.1 12.3* 13.4 14.8  HCT 42.0 33.9* 37.7* 44.1  MCV 89.2 88.5 90.8 93.8  PLT 207 126* 123* 126*   Basic Metabolic Panel: Recent Labs  Lab 04/12/21 1559 04/12/21 1845 04/12/21 2228 04/13/21 0457  04/14/21 0313 04/15/21 0758  NA 137  --  139 138 147* 155*  157*  K 7.4*  --  5.4* 3.3* 4.2 4.3  4.3  CL 91*  --  100 97* 108 114*  115*  CO2 12*  --  13* 23 28 26  28   GLUCOSE 176*  --  174* 186* 138* 175*  180*  BUN 215*  --  266* 117* 75* 52*  53*  CREATININE 16.14*  --  13.80* 6.02* 1.50* 1.04  1.04  CALCIUM 9.1  --  8.5* 8.0* 8.5* 9.3  9.5  MG  --  2.9* 2.7* 2.1 2.0 2.1  PHOS  --  16.4* 13.7* 7.2* 3.7 1.8*   GFR: Estimated Creatinine Clearance: 70.4 mL/min (by C-G formula based on SCr of 1.04 mg/dL). Liver Function Tests: Recent Labs  Lab 04/12/21 1559 04/12/21 2228 04/13/21 0457 04/15/21 0758  AST 10* 11* 13*  --   ALT 17 16 16   --   ALKPHOS 77 59 54  --   BILITOT 1.5* 1.2 1.4*  --   PROT 7.5 6.2* 5.6*  --   ALBUMIN 3.8 3.1* 2.8* 3.8   No results for input(s): LIPASE, AMYLASE in the last 168 hours. No results for input(s): AMMONIA in the last 168 hours. Coagulation Profile: Recent Labs  Lab 04/12/21 1559  INR 1.2   Cardiac Enzymes: Recent Labs  Lab 04/12/21 1745  CKTOTAL 588*   BNP (last 3 results) No results for input(s): PROBNP in the last 8760 hours. HbA1C: No results for input(s): HGBA1C in the last 72 hours. CBG: Recent Labs  Lab 04/14/21 2044 04/15/21 0027 04/15/21 0347 04/15/21 0733 04/15/21 1131  GLUCAP 169* 182* 155* 158* 150*   Lipid Profile: No results for input(s): CHOL, HDL, LDLCALC, TRIG, CHOLHDL, LDLDIRECT in the last 72 hours. Thyroid Function Tests: No results for input(s): TSH, T4TOTAL, FREET4, T3FREE, THYROIDAB in the last 72 hours. Anemia Panel: Recent Labs    04/12/21 1846  FERRITIN 176   Urine analysis:    Component Value Date/Time   COLORURINE AMBER (A) 04/12/2021 1545   APPEARANCEUR CLOUDY (A) 04/12/2021 1545   LABSPEC 1.018 04/12/2021 1545   PHURINE 5.0 04/12/2021 1545   GLUCOSEU 50 (A) 04/12/2021 1545   HGBUR SMALL (A) 04/12/2021 1545   BILIRUBINUR NEGATIVE 04/12/2021 1545   KETONESUR 5 (A)  04/12/2021 1545   PROTEINUR NEGATIVE 04/12/2021 1545   NITRITE NEGATIVE 04/12/2021 1545   LEUKOCYTESUR NEGATIVE 04/12/2021 1545   Recent Results (from the past 240 hour(s))  Urine culture     Status: Abnormal   Collection Time: 04/12/21  3:45 PM   Specimen: In/Out Cath Urine  Result Value Ref Range Status   Specimen Description   Final    IN/OUT CATH URINE Performed at [facility], [address], [place], [address] [place]    Special Requests   Final    NONE Performed at [facility], [address] [place], [place] [address]    Culture MULTIPLE SPECIES PRESENT, SUGGEST RECOLLECTION (A)  Final   Report Status 04/14/2021 FINAL  Final  Resp Panel by RT-PCR (Flu A&B, Covid) Nasopharyngeal Swab     Status: None   Collection Time: 04/12/21  4:12 PM   Specimen: Nasopharyngeal Swab; Nasopharyngeal(NP) swabs in vial transport medium  Result Value Ref Range Status   SARS Coronavirus 2 by RT PCR NEGATIVE NEGATIVE Final    Comment: (NOTE) SARS-CoV-2 target nucleic acids are NOT DETECTED.  The SARS-CoV-2 RNA is generally detectable in upper respiratory specimens during the acute phase of infection. The lowest concentration of SARS-CoV-2 viral copies this assay can detect is 138 copies/mL. A negative result does not preclude SARS-Cov-2 infection and should not be used as the sole basis for treatment or other patient management decisions. A negative result may occur with  improper specimen collection/handling, submission of specimen other than nasopharyngeal swab, presence of viral mutation(s) within the areas targeted by this assay, and inadequate number of viral copies(<138 copies/mL). A negative result must be combined with clinical observations, patient history, and epidemiological information. The expected result is Negative.  Fact Sheet for Patients:  BloggerCourse.com  Fact Sheet for Healthcare Providers:   SeriousBroker.it  This test is no t yet approved or cleared by the [place] FDA and  has been authorized for detection and/or diagnosis of SARS-CoV-2 by FDA under an Emergency Use Authorization (EUA). This EUA will remain  in effect (meaning this test can be used) for the duration of the COVID-19 declaration under Section 564(b)(1) of the Act, 21 U.S.C.section 360bbb-3(b)(1), unless the authorization is terminated  or revoked sooner.       Influenza A by PCR NEGATIVE NEGATIVE Final   Influenza B by PCR NEGATIVE NEGATIVE Final    Comment: (NOTE) The Xpert Xpress SARS-CoV-2/FLU/RSV plus assay is intended as an aid in the diagnosis of influenza from Nasopharyngeal swab specimens and should not be used as a sole basis for treatment. Nasal washings and aspirates are unacceptable for Xpert Xpress SARS-CoV-2/FLU/RSV testing.  Fact Sheet for Patients: BloggerCourse.com  Fact Sheet for Healthcare Providers: SeriousBroker.it  This test is not yet approved or cleared by the [place] FDA and has been authorized for detection and/or diagnosis of SARS-CoV-2 by FDA under an Emergency Use Authorization (EUA). This EUA will remain in effect (meaning this test can be used) for the duration of the COVID-19 declaration under Section 564(b)(1) of the Act, 21 U.S.C. section 360bbb-3(b)(1), unless the authorization is terminated or revoked.  Performed at [facility], [address] [place], [place] [address]   Blood Culture (routine x 2)     Status: None (Preliminary result)   Collection Time: 04/12/21  4:12 PM   Specimen: BLOOD  Result Value Ref Range Status   Specimen Description BLOOD BLOOD RIGHT WRIST  Final   Special Requests   Final    BOTTLES DRAWN AEROBIC AND ANAEROBIC Blood Culture adequate volume   Culture   Final    NO GROWTH 3 DAYS Performed at [facility], [address] [place], [place] [address]    Report Status PENDING  Incomplete  Blood Culture (routine x 2)     Status: None (Preliminary result)   Collection Time: 04/12/21  4:17 PM   Specimen: BLOOD  Result Value Ref Range Status   Specimen Description BLOOD LEFT ANTECUBITAL  Final   Special Requests   Final    BOTTLES  DRAWN AEROBIC AND ANAEROBIC Blood Culture adequate volume   Culture   Final    NO GROWTH 3 DAYS Performed at [facility], [address], [place], [place] [address]    Report Status PENDING  Incomplete      Radiology Studies: CT HEAD WO CONTRAST  Result Date: 04/13/2021 CLINICAL DATA:  Diminished responsiveness. EXAM: CT HEAD WITHOUT CONTRAST TECHNIQUE: Contiguous axial images were obtained from the base of the skull through the vertex without intravenous contrast. COMPARISON:  None. FINDINGS: Brain: The brain shows a normal appearance without evidence of malformation, atrophy, old or acute small or large vessel infarction, mass lesion, hemorrhage, hydrocephalus or extra-axial collection. Vascular: There is atherosclerotic calcification of the major vessels at the base of the brain. Skull: Normal.  No traumatic finding.  No focal bone lesion. Sinuses/Orbits: Sinuses are clear. Orbits appear normal. Mastoids are clear. Other: None significant IMPRESSION: Normal examination for age. Electronically Signed   By: Paulina Fusi M.D.   On: 04/13/2021 14:30    Scheduled Meds: . Chlorhexidine Gluconate Cloth  6 each Topical Q0600  . folic acid  1 mg Oral Daily  . heparin injection (subcutaneous)  5,000 Units Subcutaneous Q8H  . LORazepam  0-4 mg Intravenous Q8H  . multivitamin with minerals  1 tablet Oral Daily  . nicotine  14 mg Transdermal Daily  . thiamine  100 mg Oral Daily   Or  . thiamine  100 mg Intravenous Daily   Continuous Infusions: . sodium chloride    . sodium chloride Stopped (04/14/21 1611)     LOS: 3 days   Time spent: 35 minutes.  Tyrone Nine, MD [facility] [email or web address] 04/15/2021, 12:00 PM

## 2021-04-15 NOTE — Progress Notes (Signed)
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  He has a history of chronic alcoholism, lifelong smoking history.  Brother of patient was present and was able to provide additional details including cocaine history.  Patient denies seizures in the past.  During interview patient is able to answer questions and is able to tell me that he is having visual and auditory hallucinations which are new for him and have lasted approximately 3 to 4 days.  Brother of patient states that he has been with decreased mentation and altered sensorium in the past 1 week.  Patient has severe AKI and is admitted to the medical intensive care unit for requirement of emergent dialysis.  Patient was seen by nephrology and plan for HD overnight.  04/13/21- patient had HD overnight with significant improvement in renal function. His mentation declined overnight and there is plan for CT head today. I suspect this is related to metabolic derrangement with sedation delivered overnight.  04/14/21- patient improved significantly. He is able to speak slowly now. Blood work with imrpoved renal indices post HD.  He continues to require PRN ativan per CIWA for EtOH withrdawal. Optimizing for TRH transfer today.   04/15/21- patient is stable, he is speaking whispering, states he knows he is in [place] but that's all, still confused under anxiolytics for CIWA.  His renal function appears to have improved to baseline now.  PCCM will  sign off at this time and available if needed.    Lines/tubes :   Microbiology/Sepsis markers: Results for orders placed or performed during the hospital encounter of 04/12/21  Urine culture     Status: Abnormal   Collection Time: 04/12/21  3:45 PM   Specimen: In/Out Cath Urine  Result Value Ref Range Status   Specimen Description   Final    IN/OUT CATH URINE Performed at [facility], [address], [place], [place] [address]    Special Requests   Final    NONE Performed at [facility], [address] [place], [place] [address]    Culture MULTIPLE SPECIES PRESENT, SUGGEST RECOLLECTION (A)  Final   Report Status 04/14/2021 FINAL  Final  Resp Panel by RT-PCR (Flu A&B, Covid) Nasopharyngeal Swab     Status: None   Collection Time: 04/12/21  4:12 PM   Specimen: Nasopharyngeal Swab; Nasopharyngeal(NP) swabs in vial transport medium  Result Value Ref Range Status   SARS Coronavirus 2 by RT PCR NEGATIVE NEGATIVE Final    Comment: (NOTE) SARS-CoV-2 target nucleic acids are NOT DETECTED.  The SARS-CoV-2 RNA is generally detectable in upper respiratory specimens during the acute phase of infection. The lowest concentration of SARS-CoV-2 viral copies this assay can detect is 138 copies/mL. A negative result does not preclude SARS-Cov-2 infection and should not be used as the sole basis for treatment or other patient management decisions. A negative result may occur with  improper specimen collection/handling, submission of specimen other than nasopharyngeal swab, presence of viral mutation(s) within the areas targeted by this assay, and inadequate number of viral copies(<138 copies/mL). A negative result must be combined with clinical observations, patient history, and epidemiological information. The expected result  is Negative.  Fact Sheet for Patients:  BloggerCourse.com  Fact Sheet for Healthcare Providers:   SeriousBroker.it  This test is no t yet approved or cleared by the [place] FDA and  has been authorized for detection and/or diagnosis of SARS-CoV-2 by FDA under an Emergency Use Authorization (EUA). This EUA will remain  in effect (meaning this test can be used) for the duration of the COVID-19 declaration under Section 564(b)(1) of the Act, 21 U.S.C.section 360bbb-3(b)(1), unless the authorization is terminated  or revoked sooner.       Influenza A by PCR NEGATIVE NEGATIVE Final   Influenza B by PCR NEGATIVE NEGATIVE Final    Comment: (NOTE) The Xpert Xpress SARS-CoV-2/FLU/RSV plus assay is intended as an aid in the diagnosis of influenza from Nasopharyngeal swab specimens and should not be used as a sole basis for treatment. Nasal washings and aspirates are unacceptable for Xpert Xpress SARS-CoV-2/FLU/RSV testing.  Fact Sheet for Patients: BloggerCourse.com  Fact Sheet for Healthcare Providers: SeriousBroker.it  This test is not yet approved or cleared by the [place] FDA and has been authorized for detection and/or diagnosis of SARS-CoV-2 by FDA under an Emergency Use Authorization (EUA). This EUA will remain in effect (meaning this test can be used) for the duration of the COVID-19 declaration under Section 564(b)(1) of the Act, 21 U.S.C. section 360bbb-3(b)(1), unless the authorization is terminated or revoked.  Performed at [facility], [address] [place], [place] [address]   Blood Culture (routine x 2)     Status: None (Preliminary result)   Collection Time: 04/12/21  4:12 PM   Specimen: BLOOD  Result Value Ref Range Status   Specimen Description BLOOD BLOOD RIGHT WRIST  Final   Special Requests   Final    BOTTLES DRAWN AEROBIC AND ANAEROBIC Blood Culture adequate volume   Culture   Final    NO GROWTH 3 DAYS Performed at [facility], [address], [place], [place] [address]    Report Status PENDING  Incomplete  Blood Culture (routine x 2)     Status: None (Preliminary result)   Collection Time: 04/12/21  4:17 PM   Specimen: BLOOD  Result Value Ref Range Status   Specimen Description BLOOD LEFT ANTECUBITAL  Final   Special Requests   Final    BOTTLES DRAWN AEROBIC AND ANAEROBIC Blood Culture adequate volume   Culture   Final    NO GROWTH 3 DAYS Performed at [facility], [address], [place], [place] [address]    Report Status PENDING  Incomplete    Anti-infectives:  Anti-infectives (From admission, onward)   Start     Dose/Rate Route Frequency Ordered Stop   04/12/21 1615  cefTRIAXone (ROCEPHIN) 2 g in sodium chloride 0.9 % 100 mL IVPB  Status:  Discontinued        2 g 200 mL/hr over 30 Minutes Intravenous Every 24 hours 04/12/21 1611 04/14/21 1255   04/12/21 1615  azithromycin (ZITHROMAX) 500 mg in sodium chloride 0.9 % 250 mL IVPB  Status:  Discontinued        500 mg 250 mL/hr over 60 Minutes Intravenous Every 24 hours 04/12/21 1611 04/14/21 1255        PAST MEDICAL HISTORY   Past Medical History:  Diagnosis Date  . Diabetes mellitus without complication (HCC)   . Glaucoma   . Hyperlipidemia   . Hypertension      SURGICAL HISTORY   Past Surgical History:  Procedure  Laterality Date  . REPLACEMENT TOTAL KNEE BILATERAL Bilateral   . Skin grafts    . TOTAL SHOULDER REPLACEMENT Right      FAMILY HISTORY   Family History  Problem Relation Age of Onset  . Alzheimer's disease Father      SOCIAL HISTORY   Social History   Tobacco Use  . Smoking status: Never Smoker  . Smokeless tobacco: Never Used  Substance Use Topics  . Alcohol use: Yes    Alcohol/week: 49.0 standard drinks    Types: 49 Cans of beer per week  . Drug use: No     MEDICATIONS   Current Medication:  Current Facility-Administered Medications:  .  0.45 % sodium chloride infusion, , Intravenous, Continuous, Grunz,  Ryan B, MD .  0.9 %  sodium chloride infusion, 250 mL, Intravenous, Continuous, Rust-Chester, Cecelia Byars, NP, Stopped at 04/14/21 1611 .  Chlorhexidine Gluconate Cloth 2 % PADS 6 each, 6 each, Topical, Q0600, Mosetta Pigeon, MD, 6 each at 04/15/21 0418 .  docusate sodium (COLACE) capsule 100 mg, 100 mg, Oral, BID PRN, Vida Rigger, MD .  folic acid (FOLVITE) tablet 1 mg, 1 mg, Oral, Daily, Rust-Chester, Britton L, NP .  heparin injection 1,000-6,000 Units, 1,000-6,000 Units, Intravenous, BID PRN, Rust-Chester, Britton L, NP, 2,800 Units at 04/13/21 0038 .  heparin injection 5,000 Units, 5,000 Units, Subcutaneous, Q8H, Vida Rigger, MD, 5,000 Units at 04/15/21 0420 .  [EXPIRED] LORazepam (ATIVAN) injection 0-4 mg, 0-4 mg, Intravenous, Q4H, 2 mg at 04/15/21 0418 **FOLLOWED BY** LORazepam (ATIVAN) injection 0-4 mg, 0-4 mg, Intravenous, Q8H, Rust-Chester, Britton L, NP, 1 mg at 04/15/21 0819 .  LORazepam (ATIVAN) tablet 1-4 mg, 1-4 mg, Oral, Q1H PRN **OR** LORazepam (ATIVAN) injection 1-4 mg, 1-4 mg, Intravenous, Q1H PRN, Rust-Chester, Britton L, NP, 1 mg at 04/15/21 0133 .  morphine 2 MG/ML injection 2 mg, 2 mg, Intravenous, Q2H PRN, Andris Baumann, MD, 2 mg at 04/15/21 0226 .  multivitamin with minerals tablet 1 tablet, 1 tablet, Oral, Daily, Rust-Chester, Britton L, NP .  nicotine (NICODERM CQ - dosed in mg/24 hours) patch 14 mg, 14 mg, Transdermal, Daily, Karna Christmas, Avishai Reihl, MD, 14 mg at 04/15/21 0824 .  polyethylene glycol (MIRALAX / GLYCOLAX) packet 17 g, 17 g, Oral, Daily PRN, Karna Christmas, Tikita Mabee, MD .  sodium chloride 0.45 % bolus 500 mL, 500 mL, Intravenous, Once, Hazeline Junker B, MD .  thiamine tablet 100 mg, 100 mg, Oral, Daily **OR** thiamine (B-1) injection 100 mg, 100 mg, Intravenous, Daily, Rust-Chester, Britton L, NP, 100 mg at 04/15/21 3235    ALLERGIES   Patient has no known allergies.    REVIEW OF SYSTEMS     10 point ROS conducted exam except for hallucinations both auditory  and visual.  PHYSICAL EXAMINATION   Vital Signs: Temp:  [97.4 F (36.3 C)-98.5 F (36.9 C)] 98.5 F (36.9 C) (05/02 0356) Pulse Rate:  [70-110] 98 (05/02 0735) Resp:  [15-20] 17 (05/02 0735) BP: (116-168)/(68-115) 164/100 (05/02 0735) SpO2:  [95 %-100 %] 95 % (05/02 0356) Weight:  [64.3 kg] 64.3 kg (05/02 0500)  GENERAL: Older than stated age, no apparent distress.  Becomes intermittently sedated post CIWA meds HEAD: Normocephalic, atraumatic.  EYES: Pupils equal, round, reactive to light.  No scleral icterus.  Positive conjunctivitis  MOUTH: Moist mucosal membrane. NECK: Supple. No thyromegaly. No nodules. No JVD.  PULMONARY: Clear to auscultation with mild rhonchorous breath sounds bilaterally CARDIOVASCULAR: S1 and S2. Regular rate and rhythm. No murmurs, rubs, or  gallops.  GASTROINTESTINAL: Soft, nontender, non-distended. No masses. Positive bowel sounds. No hepatosplenomegaly.  MUSCULOSKELETAL: No swelling, clubbing, or edema.  NEUROLOGIC: Altered sensorium with no focal deficit SKIN:intact,warm,dry   PERTINENT DATA     Infusions: . sodium chloride    . sodium chloride Stopped (04/14/21 1611)  . sodium chloride     Scheduled Medications: . Chlorhexidine Gluconate Cloth  6 each Topical Q0600  . folic acid  1 mg Oral Daily  . heparin injection (subcutaneous)  5,000 Units Subcutaneous Q8H  . LORazepam  0-4 mg Intravenous Q8H  . multivitamin with minerals  1 tablet Oral Daily  . nicotine  14 mg Transdermal Daily  . thiamine  100 mg Oral Daily   Or  . thiamine  100 mg Intravenous Daily   PRN Medications: docusate sodium, heparin, LORazepam **OR** LORazepam, morphine injection, polyethylene glycol Hemodynamic parameters:   Intake/Output: 05/01 0701 - 05/02 0700 In: 18.9 [I.V.:18.9] Out: 1900 [Urine:1900]  Ventilator  Settings:    LAB RESULTS:  Basic Metabolic Panel: Recent Labs  Lab 04/12/21 1559 04/12/21 1845 04/12/21 2228 04/13/21 0457  04/14/21 0313 04/15/21 0758  NA 137  --  139 138 147* 157*  K 7.4*  --  5.4* 3.3* 4.2 4.3  CL 91*  --  100 97* 108 115*  CO2 12*  --  13* 23 28 28   GLUCOSE 176*  --  174* 186* 138* 180*  BUN 215*  --  266* 117* 75* 53*  CREATININE 16.14*  --  13.80* 6.02* 1.50* 1.04  CALCIUM 9.1  --  8.5* 8.0* 8.5* 9.5  MG  --  2.9* 2.7* 2.1 2.0  --   PHOS  --  16.4* 13.7* 7.2* 3.7 1.8*   Liver Function Tests: Recent Labs  Lab 04/12/21 1559 04/12/21 2228 04/13/21 0457 04/15/21 0758  AST 10* 11* 13*  --   ALT 17 16 16   --   ALKPHOS 77 59 54  --   BILITOT 1.5* 1.2 1.4*  --   PROT 7.5 6.2* 5.6*  --   ALBUMIN 3.8 3.1* 2.8* 3.8   No results for input(s): LIPASE, AMYLASE in the last 168 hours. No results for input(s): AMMONIA in the last 168 hours. CBC: Recent Labs  Lab 04/12/21 1559 04/13/21 0457 04/14/21 0313 04/15/21 0758  WBC 11.7* 6.1 4.9 4.6  NEUTROABS 10.7*  --   --   --   HGB 15.1 12.3* 13.4 14.8  HCT 42.0 33.9* 37.7* 44.1  MCV 89.2 88.5 90.8 93.8  PLT 207 126* 123* 126*   Cardiac Enzymes: Recent Labs  Lab 04/12/21 1745  CKTOTAL 588*   BNP: Invalid input(s): POCBNP CBG: Recent Labs  Lab 04/14/21 1618 04/14/21 2044 04/15/21 0027 04/15/21 0347 04/15/21 0733  GLUCAP 174* 169* 182* 155* 158*       IMAGING RESULTS:  Imaging: CT HEAD WO CONTRAST  Result Date: 04/13/2021 CLINICAL DATA:  Diminished responsiveness. EXAM: CT HEAD WITHOUT CONTRAST TECHNIQUE: Contiguous axial images were obtained from the base of the skull through the vertex without intravenous contrast. COMPARISON:  None. FINDINGS: Brain: The brain shows a normal appearance without evidence of malformation, atrophy, old or acute small or large vessel infarction, mass lesion, hemorrhage, hydrocephalus or extra-axial collection. Vascular: There is atherosclerotic calcification of the major vessels at the base of the brain. Skull: Normal.  No traumatic finding.  No focal bone lesion. Sinuses/Orbits: Sinuses  are clear. Orbits appear normal. Mastoids are clear. Other: None significant IMPRESSION: Normal examination for age. Electronically  Signed   By: Paulina FusiMark  Shogry M.D.   On: 04/13/2021 14:30   @PROBHOSP @ No results found.        ASSESSMENT AND PLAN    -Multidisciplinary rounds held today   Renal Failure-AKI stage V-              -resolved  - 04/14/21- Creat significantly imrpoved and UOP has increased -s/p emergent dialysis -follow UO -continue Foley Catheter-assess need daily -Nephrology team on case-will leave riple-lumen dialysis catheter until Renal team asks to remove  Altered mental status with confusion-improved -Likely due to toxic metabolic encephalopathy secondary to severe AKI -with Alcoholic encephalopathy and withdrawal and concomitant effects of benzo via CIWA   High anion gap metabolic acidosis   -Patient is not with hyperglycemia, suspect metabolic derangements and lactic acidosis   -Currently receiving sodium bicarb infusion and planning for emergent dialysis   Severe hyperkalemia -Repeat twelve-lead EKG -peaked T waves and PR prolongation noted - deliver 1amp calcium gloconate -emergent HD -Lokelma ordered  -Renal team on case appreciate input -pharmacy consultation   Chronic alcoholism - CIWA protocol -monitor for alcohol withdrawal syndrome -Ativan PRN ordered   Polysubstance abuse -Cocaine & tobacco history  -monitor for withdrawal -urine drug screen ordered -nicotine patch 14mg    Hx of right lower extermity osteosarcoma  - s/p AKA at age 60  -fall precautions while with altered sensorium  - may need 1:1 sitter  ID -continue IV abx as prescibed -follow up cultures  GI/Nutrition GI PROPHYLAXIS as indicated DIET-->TF's as tolerated Constipation protocol as indicated  ENDO - ICU hypoglycemic\Hyperglycemia protocol -check FSBS per protocol    DVT/GI PRX ordered -SCDs  TRANSFUSIONS AS NEEDED MONITOR FSBS ASSESS the need for LABS  as needed   Critical care provider statement:    Critical care time (minutes):  33   Critical care time was exclusive of:  Separately billable procedures and treating other patients   Critical care was necessary to treat or prevent imminent or life-threatening deterioration of the following conditions:  AKI stage5, hx of osteosarcoma, alcoholism, polysubstance abuse, multiple comorbid conditions   Critical care was time spent personally by me on the following activities:  Development of treatment plan with patient or surrogate, discussions with consultants, evaluation of patient's response to treatment, examination of patient, obtaining history from patient or surrogate, ordering and performing treatments and interventions, ordering and review of laboratory studies and re-evaluation of patient's condition.  I assumed direction of critical care for this patient from another provider in my specialty: no    This document was prepared using Dragon voice recognition software and may include unintentional dictation errors.    Vida RiggerFuad Shamera Yarberry, M.D.  Division of Pulmonary & Critical Care Medicine  [facility]

## 2021-04-15 NOTE — Progress Notes (Signed)
potassium and phosphorous packet held cuz pt is NPO and agitated as MD order

## 2021-04-16 LAB — BASIC METABOLIC PANEL
Anion gap: 10 (ref 5–15)
Anion gap: 11 (ref 5–15)
Anion gap: 8 (ref 5–15)
Anion gap: 9 (ref 5–15)
Anion gap: 9 (ref 5–15)
BUN: 26 mg/dL — ABNORMAL HIGH (ref 6–20)
BUN: 27 mg/dL — ABNORMAL HIGH (ref 6–20)
BUN: 30 mg/dL — ABNORMAL HIGH (ref 6–20)
BUN: 36 mg/dL — ABNORMAL HIGH (ref 6–20)
BUN: 38 mg/dL — ABNORMAL HIGH (ref 6–20)
CO2: 29 mmol/L (ref 22–32)
CO2: 29 mmol/L (ref 22–32)
CO2: 29 mmol/L (ref 22–32)
CO2: 30 mmol/L (ref 22–32)
CO2: 31 mmol/L (ref 22–32)
Calcium: 8.8 mg/dL — ABNORMAL LOW (ref 8.9–10.3)
Calcium: 9.1 mg/dL (ref 8.9–10.3)
Calcium: 9.2 mg/dL (ref 8.9–10.3)
Calcium: 9.2 mg/dL (ref 8.9–10.3)
Calcium: 9.3 mg/dL (ref 8.9–10.3)
Chloride: 105 mmol/L (ref 98–111)
Chloride: 111 mmol/L (ref 98–111)
Chloride: 111 mmol/L (ref 98–111)
Chloride: 113 mmol/L — ABNORMAL HIGH (ref 98–111)
Chloride: 113 mmol/L — ABNORMAL HIGH (ref 98–111)
Creatinine, Ser: 0.73 mg/dL (ref 0.61–1.24)
Creatinine, Ser: 0.78 mg/dL (ref 0.61–1.24)
Creatinine, Ser: 0.81 mg/dL (ref 0.61–1.24)
Creatinine, Ser: 0.85 mg/dL (ref 0.61–1.24)
Creatinine, Ser: 0.86 mg/dL (ref 0.61–1.24)
GFR, Estimated: >60 mL/min (ref >60)
GFR, Estimated: >60 mL/min (ref >60)
GFR, Estimated: >60 mL/min (ref >60)
GFR, Estimated: >60 mL/min (ref >60)
GFR, Estimated: >60 mL/min (ref >60)
Glucose, Bld: 206 mg/dL — ABNORMAL HIGH (ref 70–99)
Glucose, Bld: 220 mg/dL — ABNORMAL HIGH (ref 70–99)
Glucose, Bld: 225 mg/dL — ABNORMAL HIGH (ref 70–99)
Glucose, Bld: 231 mg/dL — ABNORMAL HIGH (ref 70–99)
Glucose, Bld: 237 mg/dL — ABNORMAL HIGH (ref 70–99)
Potassium: 3.3 mmol/L — ABNORMAL LOW (ref 3.5–5.1)
Potassium: 3.4 mmol/L — ABNORMAL LOW (ref 3.5–5.1)
Potassium: 3.4 mmol/L — ABNORMAL LOW (ref 3.5–5.1)
Potassium: 3.4 mmol/L — ABNORMAL LOW (ref 3.5–5.1)
Potassium: 3.6 mmol/L (ref 3.5–5.1)
Sodium: 143 mmol/L (ref 135–145)
Sodium: 150 mmol/L — ABNORMAL HIGH (ref 135–145)
Sodium: 150 mmol/L — ABNORMAL HIGH (ref 135–145)
Sodium: 152 mmol/L — ABNORMAL HIGH (ref 135–145)
Sodium: 153 mmol/L — ABNORMAL HIGH (ref 135–145)

## 2021-04-16 LAB — GLUCOSE, CAPILLARY
Glucose-Capillary: 176 mg/dL — ABNORMAL HIGH (ref 70–99)
Glucose-Capillary: 186 mg/dL — ABNORMAL HIGH (ref 70–99)
Glucose-Capillary: 190 mg/dL — ABNORMAL HIGH (ref 70–99)
Glucose-Capillary: 211 mg/dL — ABNORMAL HIGH (ref 70–99)
Glucose-Capillary: 212 mg/dL — ABNORMAL HIGH (ref 70–99)
Glucose-Capillary: 219 mg/dL — ABNORMAL HIGH (ref 70–99)
Glucose-Capillary: 250 mg/dL — ABNORMAL HIGH (ref 70–99)

## 2021-04-16 LAB — ANA W/REFLEX IF POSITIVE: Anti Nuclear Antibody (ANA): NEGATIVE

## 2021-04-16 LAB — HEPATITIS B DNA, ULTRAQUANTITATIVE, PCR
HBV DNA SERPL PCR-ACNC: NOT DETECTED IU/mL
HBV DNA SERPL PCR-LOG IU: UNDETERMINED log10 IU/mL

## 2021-04-16 LAB — MAGNESIUM: Magnesium: 2 mg/dL (ref 1.7–2.4)

## 2021-04-16 MED ORDER — AMLODIPINE BESYLATE 5 MG PO TABS
5.0000 mg | ORAL_TABLET | Freq: Every day | ORAL | Status: DC
  Administered 2021-04-16: 17:00:00 5 mg via ORAL
  Filled 2021-04-16 (×2): qty 1

## 2021-04-16 MED ORDER — LORAZEPAM 2 MG/ML IJ SOLN
1.0000 mg | INTRAMUSCULAR | Status: AC | PRN
Start: 2021-04-16 — End: 2021-04-19
  Administered 2021-04-16: 12:00:00 1 mg via INTRAVENOUS
  Administered 2021-04-17: 21:00:00 2 mg via INTRAVENOUS
  Administered 2021-04-17: 1 mg via INTRAVENOUS
  Administered 2021-04-18: 2 mg via INTRAVENOUS
  Filled 2021-04-16 (×4): qty 1

## 2021-04-16 MED ORDER — SODIUM CHLORIDE 0.9 % IV SOLN
1.0000 mg | Freq: Every day | INTRAVENOUS | Status: DC
  Administered 2021-04-16: 1 mg via INTRAVENOUS
  Filled 2021-04-16 (×5): qty 0.2

## 2021-04-16 MED ORDER — POTASSIUM CHLORIDE 2 MEQ/ML IV SOLN
INTRAVENOUS | Status: DC
  Filled 2021-04-16 (×6): qty 1000

## 2021-04-16 MED ORDER — CLONIDINE HCL 0.1 MG/24HR TD PTWK
0.1000 mg | MEDICATED_PATCH | TRANSDERMAL | Status: DC
  Filled 2021-04-16: qty 1

## 2021-04-16 MED ORDER — INSULIN ASPART 100 UNIT/ML IJ SOLN
0.0000 [IU] | INTRAMUSCULAR | Status: DC
  Administered 2021-04-16: 2 [IU] via SUBCUTANEOUS
  Administered 2021-04-16: 10:00:00 3 [IU] via SUBCUTANEOUS
  Administered 2021-04-16: 2 [IU] via SUBCUTANEOUS
  Administered 2021-04-16: 3 [IU] via SUBCUTANEOUS
  Administered 2021-04-16: 2 [IU] via SUBCUTANEOUS
  Administered 2021-04-17: 3 [IU] via SUBCUTANEOUS
  Administered 2021-04-17 (×4): 2 [IU] via SUBCUTANEOUS
  Administered 2021-04-18: 08:00:00 1 [IU] via SUBCUTANEOUS
  Administered 2021-04-18: 3 [IU] via SUBCUTANEOUS
  Administered 2021-04-18 (×3): 2 [IU] via SUBCUTANEOUS
  Administered 2021-04-19: 3 [IU] via SUBCUTANEOUS
  Administered 2021-04-19: 04:00:00 1 [IU] via SUBCUTANEOUS
  Administered 2021-04-19: 3 [IU] via SUBCUTANEOUS
  Administered 2021-04-19: 1 [IU] via SUBCUTANEOUS
  Administered 2021-04-19 – 2021-04-20 (×2): 2 [IU] via SUBCUTANEOUS
  Administered 2021-04-20: 1 [IU] via SUBCUTANEOUS
  Administered 2021-04-20: 12:00:00 3 [IU] via SUBCUTANEOUS
  Filled 2021-04-16 (×23): qty 1

## 2021-04-16 MED ORDER — FOLIC ACID 1 MG PO TABS
1.0000 mg | ORAL_TABLET | Freq: Every day | ORAL | Status: DC
  Administered 2021-04-17 – 2021-04-20 (×4): 1 mg via ORAL
  Filled 2021-04-16 (×4): qty 1

## 2021-04-16 MED ORDER — LORAZEPAM 1 MG PO TABS
1.0000 mg | ORAL_TABLET | ORAL | Status: AC | PRN
Start: 2021-04-16 — End: 2021-04-19

## 2021-04-16 NOTE — Progress Notes (Addendum)
PROGRESS NOTE  Isaac AlertBilly Bentsen  XBJ:[id number] DOB: 28-Sep-1961 DOA: 04/12/2021 PCP: Dan MakerFleming, Ron D, MD   Brief Narrative: Isaac Morrison is a 60 y.o. male with a history of childhood osteosarcoma s/p AKA, lifelong tobacco use, alcoholism, T2DM, HLD, HTN who presented to the ED 4/29 with 1 week of worsening delirium found to have severe renal failure with hyperkalemia and acidosis. He was admitted to the ICU, underwent HD with improvement in metabolic parameters. Mentation has remained severely altered and ativan has been given for alcohol withdrawal. He was transferred to the medical floor 5/1 on hospitalist service as of 5/2. Sodium has trended up to 157 and improving with D5W. Nephrology is on board.   Assessment & Plan: Active Problems:   Acute renal failure (HCC)  Acute renal failure: Resolved. UOP improved. s/p emergent dialysis 4/30.  - Monitor I/O - Continue foley for monitoring - Nephrology following. No HD required today, still has central line  Hypernatremia: Severe and progressive. Improving with D5W gtt at goal rate.  - Continue serial BMPs and continue D5W  Hypokalemia:  - Supplement in IV fluids.   Hyperkalemia: Resolved with HD. Stable.   High anion gap metabolic acidosis, lactic acidosis:  - Monitoring metabolic panel as above  Acute toxic metabolic encephalopathy: Due to alcohol withdrawal as well as metabolic derangements.  - CIWA - Fluids as above - SLP evaluation to see if we can start diet, though mentation may need to improve a bit.  HTN with HTN urgency:  - More cooperative and alter today, so we'll try po norvasc in addition to the prn labetalol. Will also start low dose clonidine patch.   Polysubstance abuse, tobacco use:  - Nicotine patch  History of osteosarcoma s/p right AKA at age 60.   Leukocytosis: At admission, resolved, afebrile. Received CTX 4/29, 4/30, stopped with nonclonal UCx and negative BCx.  GERD:  - Hold PPI while NPO. Keep NPO until  mentation clears.  Glaucoma:  - Right eye is red, though pt denies pain. Important to continue gtt's if patient allows.   Suspected LHBT rupture:  - Consider MSK U/S once exam is more reliable.   DVT prophylaxis: Heparin Code Status: Full Family Communication: None at bedside Disposition Plan:  Status is: Inpatient  Remains inpatient appropriate because:Persistent severe electrolyte disturbances and Altered mental status   Dispo: The patient is from: Home              Anticipated d/c is to: Home              Patient currently is not medically stable to d/c.   Difficult to place patient No  Consultants:   Nephrology  PCCM  Procedures:   Dialysis 4/29-4/30  Antimicrobials:  Ceftriaxone x2   Subjective: Blood sugar elevated, becoming slightly more alert, but still very confused. No chest or other pain. No dyspnea. No leg swelling.  Objective: Vitals:   04/16/21 0438 04/16/21 0521 04/16/21 0823 04/16/21 0900  BP: (!) 176/104 (!) 166/102 (!) 185/99 (!) 170/100  Pulse: 94 79 91   Resp: 14  16   Temp: 97.7 F (36.5 C)  98.4 F (36.9 C)   TempSrc:      SpO2:      Weight:      Height:        Intake/Output Summary (Last 24 hours) at 04/16/2021 1119 Last data filed at 04/16/2021 0659 Gross per 24 hour  Intake 1173.76 ml  Output 2800 ml  Net -1626.24 ml  Filed Weights   04/13/21 0504 04/14/21 0500 04/15/21 0500  Weight: 69.5 kg 64.1 kg 64.3 kg   Gen: Chronically ill-appearing diaphoretic male in no distress HEENT: Poor dentition, airway patent Pulm: Nonlabored breathing room air. Clear anterolaterally. CV: Regular rate and rhythm. No murmur, rub, or gallop. No JVD, no pitting dependent edema. GI: Abdomen soft, non-tender, scaphoid, non-distended, with normoactive bowel sounds.  Ext: Warm. Ecchymosis with distal lump accentuated with elbow flexion on right. Right AKA stump stable. Skin: No new rashes, lesions or ulcers on visualized skin.  Neuro: Alert and only  oriented x1, more cooperative with exam, no focal weakness or numbness. Keeps arms elevated with mittens on hands, constantly moving/restless. Moves all extremities. Psych: Judgement and insight appear impaired.   Data Reviewed: I have personally reviewed following labs and imaging studies  CBC: Recent Labs  Lab 04/12/21 1559 04/13/21 0457 04/14/21 0313 04/15/21 0758  WBC 11.7* 6.1 4.9 4.6  NEUTROABS 10.7*  --   --   --   HGB 15.1 12.3* 13.4 14.8  HCT 42.0 33.9* 37.7* 44.1  MCV 89.2 88.5 90.8 93.8  PLT 207 126* 123* 126*   Basic Metabolic Panel: Recent Labs  Lab 04/12/21 1845 04/12/21 2228 04/13/21 0457 04/14/21 0313 04/15/21 0758 04/15/21 1614 04/15/21 2008 04/16/21 0009 04/16/21 0406  NA  --  139 138 147* 155*  157* 156* 155* 153* 152*  K  --  5.4* 3.3* 4.2 4.3  4.3 3.7 3.8 3.6 3.4*  CL  --  100 97* 108 114*  115* 116* 114* 113* 113*  CO2  --  13* 23 28 26  28 27 29 29 29   GLUCOSE  --  174* 186* 138* 175*  180* 171* 219* 231* 225*  BUN  --  266* 117* 75* 52*  53* 45* 41* 38* 36*  CREATININE  --  13.80* 6.02* 1.50* 1.04  1.04 0.89 0.83 0.78 0.85  CALCIUM  --  8.5* 8.0* 8.5* 9.3  9.5 9.1 9.2 9.2 9.2  MG 2.9* 2.7* 2.1 2.0 2.1  --   --   --  2.0  PHOS 16.4* 13.7* 7.2* 3.7 1.8*  --   --   --   --    GFR: Estimated Creatinine Clearance: 86.2 mL/min (by C-G formula based on SCr of 0.85 mg/dL). Liver Function Tests: Recent Labs  Lab 04/12/21 1559 04/12/21 2228 04/13/21 0457 04/15/21 0758  AST 10* 11* 13*  --   ALT 17 16 16   --   ALKPHOS 77 59 54  --   BILITOT 1.5* 1.2 1.4*  --   PROT 7.5 6.2* 5.6*  --   ALBUMIN 3.8 3.1* 2.8* 3.8   No results for input(s): LIPASE, AMYLASE in the last 168 hours. No results for input(s): AMMONIA in the last 168 hours. Coagulation Profile: Recent Labs  Lab 04/12/21 1559  INR 1.2   Cardiac Enzymes: Recent Labs  Lab 04/12/21 1745  CKTOTAL 588*   BNP (last 3 results) No results for input(s): PROBNP in the last 8760  hours. HbA1C: No results for input(s): HGBA1C in the last 72 hours. CBG: Recent Labs  Lab 04/15/21 1520 04/15/21 2007 04/15/21 2311 04/16/21 0411 04/16/21 0733  GLUCAP 155* 207* 219* 211* 212*   Lipid Profile: No results for input(s): CHOL, HDL, LDLCALC, TRIG, CHOLHDL, LDLDIRECT in the last 72 hours. Thyroid Function Tests: No results for input(s): TSH, T4TOTAL, FREET4, T3FREE, THYROIDAB in the last 72 hours. Anemia Panel: No results for input(s): VITAMINB12, FOLATE, FERRITIN,  TIBC, IRON, RETICCTPCT in the last 72 hours. Urine analysis:    Component Value Date/Time   COLORURINE AMBER (A) 04/12/2021 1545   APPEARANCEUR CLOUDY (A) 04/12/2021 1545   LABSPEC 1.018 04/12/2021 1545   PHURINE 5.0 04/12/2021 1545   GLUCOSEU 50 (A) 04/12/2021 1545   HGBUR SMALL (A) 04/12/2021 1545   BILIRUBINUR NEGATIVE 04/12/2021 1545   KETONESUR 5 (A) 04/12/2021 1545   PROTEINUR NEGATIVE 04/12/2021 1545   NITRITE NEGATIVE 04/12/2021 1545   LEUKOCYTESUR NEGATIVE 04/12/2021 1545   Recent Results (from the past 240 hour(s))  Urine culture     Status: Abnormal   Collection Time: 04/12/21  3:45 PM   Specimen: In/Out Cath Urine  Result Value Ref Range Status   Specimen Description   Final    IN/OUT CATH URINE Performed at [facility], [address] [place], [place] [address]    Special Requests   Final    NONE Performed at [facility], [address] [place], [place] [address]    Culture MULTIPLE SPECIES PRESENT, SUGGEST RECOLLECTION (A)  Final   Report Status 04/14/2021 FINAL  Final  Resp Panel by RT-PCR (Flu A&B, Covid) Nasopharyngeal Swab     Status: None   Collection Time: 04/12/21  4:12 PM   Specimen: Nasopharyngeal Swab; Nasopharyngeal(NP) swabs in vial transport medium  Result Value Ref Range Status   SARS Coronavirus 2 by RT PCR NEGATIVE NEGATIVE Final    Comment: (NOTE) SARS-CoV-2 target nucleic acids are NOT DETECTED.  The SARS-CoV-2 RNA is generally  detectable in upper respiratory specimens during the acute phase of infection. The lowest concentration of SARS-CoV-2 viral copies this assay can detect is 138 copies/mL. A negative result does not preclude SARS-Cov-2 infection and should not be used as the sole basis for treatment or other patient management decisions. A negative result may occur with  improper specimen collection/handling, submission of specimen other than nasopharyngeal swab, presence of viral mutation(s) within the areas targeted by this assay, and inadequate number of viral copies(<138 copies/mL). A negative result must be combined with clinical observations, patient history, and epidemiological information. The expected result is Negative.  Fact Sheet for Patients:  BloggerCourse.com  Fact Sheet for Healthcare Providers:  SeriousBroker.it  This test is no t yet approved or cleared by the [place] FDA and  has been authorized for detection and/or diagnosis of SARS-CoV-2 by FDA under an Emergency Use Authorization (EUA). This EUA will remain  in effect (meaning this test can be used) for the duration of the COVID-19 declaration under Section 564(b)(1) of the Act, 21 U.S.C.section 360bbb-3(b)(1), unless the authorization is terminated  or revoked sooner.       Influenza A by PCR NEGATIVE NEGATIVE Final   Influenza B by PCR NEGATIVE NEGATIVE Final    Comment: (NOTE) The Xpert Xpress SARS-CoV-2/FLU/RSV plus assay is intended as an aid in the diagnosis of influenza from Nasopharyngeal swab specimens and should not be used as a sole basis for treatment. Nasal washings and aspirates are unacceptable for Xpert Xpress SARS-CoV-2/FLU/RSV testing.  Fact Sheet for Patients: BloggerCourse.com  Fact Sheet for Healthcare Providers: SeriousBroker.it  This test is not yet approved or cleared by the [place] FDA  and has been authorized for detection and/or diagnosis of SARS-CoV-2 by FDA under an Emergency Use Authorization (EUA). This EUA will remain in effect (meaning this test can be used) for the duration of the COVID-19 declaration under Section 564(b)(1) of the Act, 21 U.S.C. section 360bbb-3(b)(1), unless  the authorization is terminated or revoked.  Performed at [facility], [address] [place], [place] [address]   Blood Culture (routine x 2)     Status: None (Preliminary result)   Collection Time: 04/12/21  4:12 PM   Specimen: BLOOD  Result Value Ref Range Status   Specimen Description BLOOD BLOOD RIGHT WRIST  Final   Special Requests   Final    BOTTLES DRAWN AEROBIC AND ANAEROBIC Blood Culture adequate volume   Culture   Final    NO GROWTH 4 DAYS Performed at [facility], [address], [place], [place] [address]    Report Status PENDING  Incomplete  Blood Culture (routine x 2)     Status: None (Preliminary result)   Collection Time: 04/12/21  4:17 PM   Specimen: BLOOD  Result Value Ref Range Status   Specimen Description BLOOD LEFT ANTECUBITAL  Final   Special Requests   Final    BOTTLES DRAWN AEROBIC AND ANAEROBIC Blood Culture adequate volume   Culture   Final    NO GROWTH 4 DAYS Performed at [facility], [address], [place], [place] [address]    Report Status PENDING  Incomplete      Radiology Studies: No results found.  Scheduled Meds: . Chlorhexidine Gluconate Cloth  6 each Topical Q0600  . dorzolamide-timolol  1 drop Both Eyes BID  . folic acid  1 mg Oral Daily  . heparin injection (subcutaneous)  5,000 Units Subcutaneous Q8H  . insulin aspart  0-9 Units Subcutaneous Q4H  . LORazepam  0-4 mg Intravenous Q8H  . multivitamin with minerals  1 tablet Oral Daily  . nicotine  14 mg Transdermal Daily  . thiamine  100 mg Oral Daily   Or  . thiamine  100 mg Intravenous Daily   Continuous Infusions: . sodium chloride Stopped  (04/14/21 1611)  . dextrose 5 % with kcl       LOS: 4 days   Time spent: 35 minutes.  Tyrone Nine, MD [facility] [email or web address] 04/16/2021, 11:19 AM

## 2021-04-16 NOTE — Plan of Care (Signed)

## 2021-04-16 NOTE — Progress Notes (Signed)
Inpatient Diabetes Program Recommendations  AACE/ADA: New Consensus Statement on Inpatient Glycemic Control (2015)  Target Ranges:  Prepandial:   less than 140 mg/dL      Peak postprandial:   less than 180 mg/dL (1-2 hours)      Critically ill patients:  140 - 180 mg/dL   Lab Results  Component Value Date   GLUCAP 186 (H) 04/16/2021   HGBA1C 5.5 02/18/2017    Review of Glycemic Control Results for Isaac Morrison, Isaac Morrison (MRN [id number]) as of 04/16/2021 14:11  Ref. Range 04/15/2021 23:11 04/15/2021 23:11 04/16/2021 04:11 04/16/2021 07:33 04/16/2021 11:43  Glucose-Capillary Latest Ref Range: 70 - 99 mg/dL 761 (H) 470 (H) 929 (H) 212 (H) 186 (H)   Diabetes history: DM 2 Outpatient Diabetes medications:  Novolog (not taking) Current orders for Inpatient glycemic control:  Novolog sensitive q 4 hours Inpatient Diabetes Program Recommendations:    Novolog correction added today.  Will follow.   Thanks  Beryl Meager, RN, BC-ADM Inpatient Diabetes Coordinator Pager [phone] (8a-5p)

## 2021-04-16 NOTE — Progress Notes (Signed)
Dr. Arville Care notified of patients increasing blood sugar and elevated BP. Ordered to decrease D5w rate and labetalol prn for Blood pressure. Med administered following protocol as ordered.

## 2021-04-16 NOTE — Progress Notes (Signed)
[facility]  ROUNDING NOTE   Subjective:   Per nursing, patient was agitated during the night.  Patient is currently drowsy Recruitment consultant at bedside  Objective:  Vital signs in last 24 hours:  Temp:  [97.6 F (36.4 C)-98.4 F (36.9 C)] 97.8 F (36.6 C) (05/03 1124) Pulse Rate:  [73-101] 73 (05/03 1124) Resp:  [14-20] 16 (05/03 1124) BP: (139-185)/(83-114) 166/99 (05/03 1124) SpO2:  [95 %-100 %] 95 % (05/02 2314)  Weight change:  Filed Weights   04/13/21 0504 04/14/21 0500 04/15/21 0500  Weight: 69.5 kg 64.1 kg 64.3 kg    Intake/Output: I/O last 3 completed shifts: In: 1173.8 [I.V.:1173.8] Out: 4300 [Urine:4300]   Intake/Output this shift:  No intake/output data recorded.  Physical Exam: General:  No acute distress  Head:  Normocephalic, atraumatic. Moist oral mucosal membranes  Eyes:  Anicteric  Lungs:   Clear to auscultation, normal effort  Heart:  S1S2 no rubs  Abdomen:   Soft, nontender, bowel sounds present  Extremities:  Right AKA, no lower extremity edema  Neurologic:  Alert, unable to answer simple questions  Skin:  No acute skin rash  Access:  Right IJ temporary dialysis catheter    Basic Metabolic Panel: Recent Labs  Lab 04/12/21 1845 04/12/21 2228 04/13/21 0457 04/14/21 0313 04/15/21 0758 04/15/21 1614 04/15/21 2008 04/16/21 0009 04/16/21 0406 04/16/21 1058  NA  --  139 138 147* 155*  157* 156* 155* 153* 152* 150*  K  --  5.4* 3.3* 4.2 4.3  4.3 3.7 3.8 3.6 3.4* 3.4*  CL  --  100 97* 108 114*  115* 116* 114* 113* 113* 111  CO2  --  13* 23 28 26  28 27 29 29 29 31   GLUCOSE  --  174* 186* 138* 175*  180* 171* 219* 231* 225* 220*  BUN  --  266* 117* 75* 52*  53* 45* 41* 38* 36* 30*  CREATININE  --  13.80* 6.02* 1.50* 1.04  1.04 0.89 0.83 0.78 0.85 0.86  CALCIUM  --  8.5* 8.0* 8.5* 9.3  9.5 9.1 9.2 9.2 9.2 9.1  MG 2.9* 2.7* 2.1 2.0 2.1  --   --   --  2.0  --   PHOS 16.4* 13.7* 7.2* 3.7 1.8*  --   --   --   --   --      Liver Function Tests: Recent Labs  Lab 04/12/21 1559 04/12/21 2228 04/13/21 0457 04/15/21 0758  AST 10* 11* 13*  --   ALT 17 16 16   --   ALKPHOS 77 59 54  --   BILITOT 1.5* 1.2 1.4*  --   PROT 7.5 6.2* 5.6*  --   ALBUMIN 3.8 3.1* 2.8* 3.8   No results for input(s): LIPASE, AMYLASE in the last 168 hours. No results for input(s): AMMONIA in the last 168 hours.  CBC: Recent Labs  Lab 04/12/21 1559 04/13/21 0457 04/14/21 0313 04/15/21 0758  WBC 11.7* 6.1 4.9 4.6  NEUTROABS 10.7*  --   --   --   HGB 15.1 12.3* 13.4 14.8  HCT 42.0 33.9* 37.7* 44.1  MCV 89.2 88.5 90.8 93.8  PLT 207 126* 123* 126*    Cardiac Enzymes: Recent Labs  Lab 04/12/21 1745  CKTOTAL 588*    BNP: Invalid input(s): POCBNP  CBG: Recent Labs  Lab 04/15/21 1520 04/15/21 2007 04/15/21 2311 04/16/21 0411 04/16/21 0733  GLUCAP 155* 207* 219* 211* 212*    Microbiology: Results for orders  placed or performed during the hospital encounter of 04/12/21  Urine culture     Status: Abnormal   Collection Time: 04/12/21  3:45 PM   Specimen: In/Out Cath Urine  Result Value Ref Range Status   Specimen Description   Final    IN/OUT CATH URINE Performed at [facility], [address] [place], [place] [address]    Special Requests   Final    NONE Performed at [facility], [address] [place], [place] [address]    Culture MULTIPLE SPECIES PRESENT, SUGGEST RECOLLECTION (A)  Final   Report Status 04/14/2021 FINAL  Final  Resp Panel by RT-PCR (Flu A&B, Covid) Nasopharyngeal Swab     Status: None   Collection Time: 04/12/21  4:12 PM   Specimen: Nasopharyngeal Swab; Nasopharyngeal(NP) swabs in vial transport medium  Result Value Ref Range Status   SARS Coronavirus 2 by RT PCR NEGATIVE NEGATIVE Final    Comment: (NOTE) SARS-CoV-2 target nucleic acids are NOT DETECTED.  The SARS-CoV-2 RNA is generally detectable in upper respiratory specimens during the acute phase of  infection. The lowest concentration of SARS-CoV-2 viral copies this assay can detect is 138 copies/mL. A negative result does not preclude SARS-Cov-2 infection and should not be used as the sole basis for treatment or other patient management decisions. A negative result may occur with  improper specimen collection/handling, submission of specimen other than nasopharyngeal swab, presence of viral mutation(s) within the areas targeted by this assay, and inadequate number of viral copies(<138 copies/mL). A negative result must be combined with clinical observations, patient history, and epidemiological information. The expected result is Negative.  Fact Sheet for Patients:  BloggerCourse.com  Fact Sheet for Healthcare Providers:  SeriousBroker.it  This test is no t yet approved or cleared by the [place] FDA and  has been authorized for detection and/or diagnosis of SARS-CoV-2 by FDA under an Emergency Use Authorization (EUA). This EUA will remain  in effect (meaning this test can be used) for the duration of the COVID-19 declaration under Section 564(b)(1) of the Act, 21 U.S.C.section 360bbb-3(b)(1), unless the authorization is terminated  or revoked sooner.       Influenza A by PCR NEGATIVE NEGATIVE Final   Influenza B by PCR NEGATIVE NEGATIVE Final    Comment: (NOTE) The Xpert Xpress SARS-CoV-2/FLU/RSV plus assay is intended as an aid in the diagnosis of influenza from Nasopharyngeal swab specimens and should not be used as a sole basis for treatment. Nasal washings and aspirates are unacceptable for Xpert Xpress SARS-CoV-2/FLU/RSV testing.  Fact Sheet for Patients: BloggerCourse.com  Fact Sheet for Healthcare Providers: SeriousBroker.it  This test is not yet approved or cleared by the [place] FDA and has been authorized for detection and/or diagnosis of SARS-CoV-2  by FDA under an Emergency Use Authorization (EUA). This EUA will remain in effect (meaning this test can be used) for the duration of the COVID-19 declaration under Section 564(b)(1) of the Act, 21 U.S.C. section 360bbb-3(b)(1), unless the authorization is terminated or revoked.  Performed at [facility], [address] [place], [place] [address]   Blood Culture (routine x 2)     Status: None (Preliminary result)   Collection Time: 04/12/21  4:12 PM   Specimen: BLOOD  Result Value Ref Range Status   Specimen Description BLOOD BLOOD RIGHT WRIST  Final   Special Requests   Final    BOTTLES DRAWN AEROBIC AND ANAEROBIC Blood Culture adequate volume   Culture   Final  NO GROWTH 4 DAYS Performed at [facility], [address] [place], [place] [address]    Report Status PENDING  Incomplete  Blood Culture (routine x 2)     Status: None (Preliminary result)   Collection Time: 04/12/21  4:17 PM   Specimen: BLOOD  Result Value Ref Range Status   Specimen Description BLOOD LEFT ANTECUBITAL  Final   Special Requests   Final    BOTTLES DRAWN AEROBIC AND ANAEROBIC Blood Culture adequate volume   Culture   Final    NO GROWTH 4 DAYS Performed at [facility], [address] [place], [place] [address]    Report Status PENDING  Incomplete    Coagulation Studies: No results for input(s): LABPROT, INR in the last 72 hours.  Urinalysis: No results for input(s): COLORURINE, LABSPEC, PHURINE, GLUCOSEU, HGBUR, BILIRUBINUR, KETONESUR, PROTEINUR, UROBILINOGEN, NITRITE, LEUKOCYTESUR in the last 72 hours.  Invalid input(s): APPERANCEUR    Imaging: No results found.   Medications:   . sodium chloride Stopped (04/14/21 1611)  . dextrose 5 % with kcl     . Chlorhexidine Gluconate Cloth  6 each Topical Q0600  . dorzolamide-timolol  1 drop Both Eyes BID  . folic acid  1 mg Oral Daily  . heparin injection (subcutaneous)  5,000 Units Subcutaneous Q8H  .  insulin aspart  0-9 Units Subcutaneous Q4H  . LORazepam  0-4 mg Intravenous Q8H  . multivitamin with minerals  1 tablet Oral Daily  . nicotine  14 mg Transdermal Daily  . thiamine  100 mg Oral Daily   Or  . thiamine  100 mg Intravenous Daily   docusate sodium, heparin, labetalol, LORazepam **OR** LORazepam, morphine injection, polyethylene glycol  Assessment/ Plan:  60 y.o. male with past medical history of peripheral vascular disease status post right AKA, diabetes mellitus type 2, hypertension, hyperlipidemia, alcohol abuse who presented with severe acute kidney injury, hyperkalemia, metabolic acidosis.  1.  Acute kidney injury secondary to severe volume depletion. 2.  Severe hyperkalemia upon admission, improved with dialysis. 3.  Metabolic acidosis. 4.  Hypotension. 5. Hypernatremia  Plan: Patient had 1 dialysis session upon admission.   No need for dialysis at this time Will d/c temp cath  Hyperkalemia improved with his metabolic acidosis.  BUN and creatinine continue to improve. Sodium 150 Will monitor metabolic parameters.  IVF- D5 at 100 ml/hr Based on renal recovery, will sign off at this time.     LOS: 4   5/3/202211:30 AM

## 2021-04-16 NOTE — Consult Note (Signed)
PHARMACY CONSULT NOTE  Pharmacy Consult for Electrolyte Monitoring and Replacement   Recent Labs: Potassium (mmol/L)  Date Value  04/16/2021 3.4 (L)   Magnesium (mg/dL)  Date Value  81/27/5170 2.0   Calcium (mg/dL)  Date Value  01/74/9449 9.1   Albumin (g/dL)  Date Value  67/59/1638 3.8   Phosphorus (mg/dL)  Date Value  46/65/9935 1.8 (L)   Sodium (mmol/L)  Date Value  04/16/2021 150 (H)   Assessment: Patient w/ severe AKI/hyperkalemia and admitted to the medical intensive care unit 4/29 for requirement of emergent dialysis. Patient with severe hyperkalemia ( K 7.9), acodisos (CO2 12) and uremia (BUN 215)  Patient was seen by nephrology and received emergent HD on 4/29. No further sessions have been necessary.  Renal function markedly improved. 2426 mL UOP yesterday.  Goal of Therapy:  Electrolytes WNL   Plan:  --Na 150. Nephrology and Primary team following. --K 3.4, MD placed order for D5+K 98mEq@100ml /hr --Phos 1.8(from 5/2). Per MD, struggling with anything PO. Will hold off on supplementation at this time. --Follow-up electrolytes with AM labs tomorrow  Bettey Costa 04/16/2021 12:21 PM

## 2021-04-17 DIAGNOSIS — F10931 Alcohol use, unspecified with withdrawal delirium: Secondary | ICD-10-CM

## 2021-04-17 DIAGNOSIS — F10231 Alcohol dependence with withdrawal delirium: Secondary | ICD-10-CM

## 2021-04-17 DIAGNOSIS — E870 Hyperosmolality and hypernatremia: Secondary | ICD-10-CM

## 2021-04-17 DIAGNOSIS — E512 Wernicke's encephalopathy: Secondary | ICD-10-CM

## 2021-04-17 LAB — GLUCOSE, CAPILLARY
Glucose-Capillary: 169 mg/dL — ABNORMAL HIGH (ref 70–99)
Glucose-Capillary: 195 mg/dL — ABNORMAL HIGH (ref 70–99)
Glucose-Capillary: 209 mg/dL — ABNORMAL HIGH (ref 70–99)
Glucose-Capillary: 195 mg/dL — ABNORMAL HIGH (ref 70–99)
Glucose-Capillary: 173 mg/dL — ABNORMAL HIGH (ref 70–99)

## 2021-04-17 LAB — PROTEIN ELECTROPHORESIS, SERUM
A/G Ratio: 1.4 (ref 0.7–1.7)
Albumin ELP: 3.7 g/dL (ref 2.9–4.4)
Alpha-1-Globulin: 0.2 g/dL (ref 0.0–0.4)
Alpha-2-Globulin: 0.9 g/dL (ref 0.4–1.0)
Beta Globulin: 0.7 g/dL (ref 0.7–1.3)
Gamma Globulin: 0.9 g/dL (ref 0.4–1.8)
Globulin, Total: 2.7 g/dL (ref 2.2–3.9)
Total Protein ELP: 6.4 g/dL (ref 6.0–8.5)

## 2021-04-17 LAB — BASIC METABOLIC PANEL
Anion gap: 10 (ref 5–15)
BUN: 22 mg/dL — ABNORMAL HIGH (ref 6–20)
CO2: 29 mmol/L (ref 22–32)
Calcium: 8.9 mg/dL (ref 8.9–10.3)
Chloride: 104 mmol/L (ref 98–111)
Creatinine, Ser: 0.74 mg/dL (ref 0.61–1.24)
GFR, Estimated: >60 mL/min (ref >60)
Glucose, Bld: 196 mg/dL — ABNORMAL HIGH (ref 70–99)
Potassium: 3.2 mmol/L — ABNORMAL LOW (ref 3.5–5.1)
Sodium: 143 mmol/L (ref 135–145)

## 2021-04-17 LAB — CULTURE, BLOOD (ROUTINE X 2)
Culture: NO GROWTH
Culture: NO GROWTH
Special Requests: ADEQUATE
Special Requests: ADEQUATE

## 2021-04-17 LAB — HEMOGLOBIN A1C
Hgb A1c MFr Bld: 6.9 % — ABNORMAL HIGH (ref 4.8–5.6)
Mean Plasma Glucose: 151.33 mg/dL

## 2021-04-17 LAB — MAGNESIUM: Magnesium: 1.7 mg/dL (ref 1.7–2.4)

## 2021-04-17 LAB — PHOSPHORUS: Phosphorus: 2 mg/dL — ABNORMAL LOW (ref 2.5–4.6)

## 2021-04-17 MED ORDER — HALOPERIDOL LACTATE 5 MG/ML IJ SOLN: 1.0000 mg | Freq: Four times a day (QID) | INTRAMUSCULAR | Status: DC | PRN

## 2021-04-17 MED ORDER — THIAMINE HCL 100 MG/ML IJ SOLN
500.0000 mg | Freq: Three times a day (TID) | INTRAVENOUS | Status: AC
  Administered 2021-04-17 – 2021-04-19 (×6): 500 mg via INTRAVENOUS
  Filled 2021-04-17 (×7): qty 5

## 2021-04-17 MED ORDER — POTASSIUM & SODIUM PHOSPHATES 280-160-250 MG PO PACK
2.0000 | PACK | Freq: Three times a day (TID) | ORAL | Status: AC
  Administered 2021-04-17 – 2021-04-18 (×4): 2 via ORAL
  Filled 2021-04-17 (×4): qty 2

## 2021-04-17 MED ORDER — POTASSIUM CHLORIDE 10 MEQ/100ML IV SOLN
10.0000 meq | INTRAVENOUS | Status: AC
  Administered 2021-04-17 (×3): 10 meq via INTRAVENOUS
  Filled 2021-04-17 (×3): qty 100

## 2021-04-17 MED ORDER — MORPHINE SULFATE (PF) 2 MG/ML IV SOLN
2.0000 mg | INTRAVENOUS | Status: DC | PRN
  Administered 2021-04-17 – 2021-04-19 (×4): 2 mg via INTRAVENOUS
  Filled 2021-04-17 (×4): qty 1

## 2021-04-17 MED ORDER — PREDNISOLONE ACETATE 1 % OP SUSP
1.0000 [drp] | Freq: Three times a day (TID) | OPHTHALMIC | Status: DC
  Administered 2021-04-17 – 2021-04-20 (×10): 1 [drp] via OPHTHALMIC
  Filled 2021-04-17: qty 1

## 2021-04-17 MED ORDER — NYSTATIN 100000 UNIT/ML MT SUSP
5.0000 mL | Freq: Four times a day (QID) | OROMUCOSAL | Status: DC
  Administered 2021-04-17 – 2021-04-20 (×13): 500000 [IU] via ORAL
  Filled 2021-04-17 (×13): qty 5

## 2021-04-17 NOTE — Care Management Important Message (Addendum)
Important Message  Patient Details  Name: Isaac Morrison MRN: [id number] Date of Birth: [birth year]   Medicare Important Message Given:  Yes  Patient was sleeping so was unable to obtain initial signature on the Important Message from Medicare.  No family in the room. Copy left on bedside table.  Olegario Messier A Mitchell Epling 04/17/2021, 2:34 PM

## 2021-04-17 NOTE — Consult Note (Signed)
[facility] Face-to-Face Psychiatry Consult   Reason for Consult: Consult for 60 year old man recovering from delirium. Referring Physician:  Chipper Herb Patient Identification: Isaac Morrison MRN:  [id number] Principal Diagnosis: Alcohol withdrawal delirium (HCC) Diagnosis:  Principal Problem:   Alcohol withdrawal delirium (HCC) Active Problems:   Acute renal failure (HCC)   Hypernatremia   Wernicke encephalopathy   Total Time spent with patient: 1 hour  Subjective:   Isaac Morrison is a 60 y.o. male patient admitted with "my kidneys were not working".  HPI: Patient seen chart reviewed.  60 year old man with a history of multiple past medical problems presented to the hospital several days ago with delirium and confusion.  He was agitated in the emergency room and resisting treatment and involuntary commitment paperwork was used to facilitate forced treatment and restraint.  Patient needed dialysis for several days.  He is now gradually recovering.  Found the patient awake in his room but appeared very sluggish.  Only minimal eye contact.  Patient was able to tell me his name and tell me where he was and knew that he had had renal problems.  Tells me that he had to get dialysis because his kidneys were not working.  Now they are getting better.  Patient says he remembers getting into an ambulance but does not remember anything else about coming into the hospital.  Spoke about alcohol use claims that he has never thought of it is a problem.  Minimized his use of it saying that he might drink a 6 pack every now and then.  Denies other drug abuse.  Patient currently denies any suicidal ideation.  He denies auditory hallucinations but mentions visual hallucinations.  Mentions that he has seen things that look like bugs on the wall.  Nurse also reports that he thought he saw someone under the bed earlier today.  Past Psychiatric History: Minimal past psychiatric history.  No known hospitalizations no history of  suicide attempts.  Unclear if he has ever received any substance abuse treatment but he does not recall it.  Risk to Self:   Risk to Others:   Prior Inpatient Therapy:   Prior Outpatient Therapy:    Past Medical History:  Past Medical History:  Diagnosis Date  . Diabetes mellitus without complication (HCC)   . Glaucoma   . Hyperlipidemia   . Hypertension     Past Surgical History:  Procedure Laterality Date  . REPLACEMENT TOTAL KNEE BILATERAL Bilateral   . Skin grafts    . TOTAL SHOULDER REPLACEMENT Right    Family History:  Family History  Problem Relation Age of Onset  . Alzheimer's disease Father    Family Psychiatric  History: None reported Social History:  Social History   Substance and Sexual Activity  Alcohol Use Yes  . Alcohol/week: 49.0 standard drinks  . Types: 49 Cans of beer per week     Social History   Substance and Sexual Activity  Drug Use No    Social History   Socioeconomic History  . Marital status: Single    Spouse name: Not on file  . Number of children: Not on file  . Years of education: Not on file  . Highest education level: Not on file  Occupational History  . Not on file  Tobacco Use  . Smoking status: Never Smoker  . Smokeless tobacco: Never Used  Substance and Sexual Activity  . Alcohol use: Yes    Alcohol/week: 49.0 standard drinks    Types: 49 Cans of beer  per week  . Drug use: No  . Sexual activity: Not on file  Other Topics Concern  . Not on file  Social History Narrative  . Not on file   Social Determinants of Health   Financial Resource Strain: Not on file  Food Insecurity: Not on file  Transportation Needs: Not on file  Physical Activity: Not on file  Stress: Not on file  Social Connections: Not on file   Additional Social History:    Allergies:  No Known Allergies  Labs:  Results for orders placed or performed during the hospital encounter of 04/12/21 (from the past 48 hour(s))  Glucose, capillary      Status: Abnormal   Collection Time: 04/15/21  8:07 PM  Result Value Ref Range   Glucose-Capillary 207 (H) 70 - 99 mg/dL    Comment: Glucose reference range applies only to samples taken after fasting for at least 8 hours.  Basic metabolic panel     Status: Abnormal   Collection Time: 04/15/21  8:08 PM  Result Value Ref Range   Sodium 155 (H) 135 - 145 mmol/L   Potassium 3.8 3.5 - 5.1 mmol/L   Chloride 114 (H) 98 - 111 mmol/L   CO2 29 22 - 32 mmol/L   Glucose, Bld 219 (H) 70 - 99 mg/dL    Comment: Glucose reference range applies only to samples taken after fasting for at least 8 hours.   BUN 41 (H) 6 - 20 mg/dL   Creatinine, Ser 8.36 0.61 - 1.24 mg/dL   Calcium 9.2 8.9 - 62.9 mg/dL   GFR, Estimated >47 >65 mL/min    Comment: (NOTE) Calculated using the CKD-EPI Creatinine Equation (2021)    Anion gap 12 5 - 15    Comment: Performed at [facility], [address] [place], [place] [address]  Glucose, capillary     Status: Abnormal   Collection Time: 04/15/21 11:11 PM  Result Value Ref Range   Glucose-Capillary 219 (H) 70 - 99 mg/dL    Comment: Glucose reference range applies only to samples taken after fasting for at least 8 hours.  Glucose, capillary     Status: Abnormal   Collection Time: 04/15/21 11:11 PM  Result Value Ref Range   Glucose-Capillary 219 (H) 70 - 99 mg/dL    Comment: Glucose reference range applies only to samples taken after fasting for at least 8 hours.  Basic metabolic panel     Status: Abnormal   Collection Time: 04/16/21 12:09 AM  Result Value Ref Range   Sodium 153 (H) 135 - 145 mmol/L   Potassium 3.6 3.5 - 5.1 mmol/L   Chloride 113 (H) 98 - 111 mmol/L   CO2 29 22 - 32 mmol/L   Glucose, Bld 231 (H) 70 - 99 mg/dL    Comment: Glucose reference range applies only to samples taken after fasting for at least 8 hours.   BUN 38 (H) 6 - 20 mg/dL   Creatinine, Ser 5.46 0.61 - 1.24 mg/dL   Calcium 9.2 8.9 - 56.8 mg/dL   GFR, Estimated >12 >75 mL/min     Comment: (NOTE) Calculated using the CKD-EPI Creatinine Equation (2021)    Anion gap 11 5 - 15    Comment: Performed at [facility], [address] [place], [place] [address]  Basic metabolic panel     Status: Abnormal   Collection Time: 04/16/21  4:06 AM  Result Value Ref Range   Sodium 152 (H) 135 - 145  mmol/L   Potassium 3.4 (L) 3.5 - 5.1 mmol/L   Chloride 113 (H) 98 - 111 mmol/L   CO2 29 22 - 32 mmol/L   Glucose, Bld 225 (H) 70 - 99 mg/dL    Comment: Glucose reference range applies only to samples taken after fasting for at least 8 hours.   BUN 36 (H) 6 - 20 mg/dL   Creatinine, Ser 1.61 0.61 - 1.24 mg/dL   Calcium 9.2 8.9 - 09.6 mg/dL   GFR, Estimated >04 >54 mL/min    Comment: (NOTE) Calculated using the CKD-EPI Creatinine Equation (2021)    Anion gap 10 5 - 15    Comment: Performed at [facility], [address] [place], [place] [address]  Magnesium     Status: None   Collection Time: 04/16/21  4:06 AM  Result Value Ref Range   Magnesium 2.0 1.7 - 2.4 mg/dL    Comment: Performed at [facility], [address] [place], [place] [address]  Glucose, capillary     Status: Abnormal   Collection Time: 04/16/21  4:11 AM  Result Value Ref Range   Glucose-Capillary 211 (H) 70 - 99 mg/dL    Comment: Glucose reference range applies only to samples taken after fasting for at least 8 hours.  Glucose, capillary     Status: Abnormal   Collection Time: 04/16/21  7:33 AM  Result Value Ref Range   Glucose-Capillary 212 (H) 70 - 99 mg/dL    Comment: Glucose reference range applies only to samples taken after fasting for at least 8 hours.  Basic metabolic panel     Status: Abnormal   Collection Time: 04/16/21 10:58 AM  Result Value Ref Range   Sodium 150 (H) 135 - 145 mmol/L   Potassium 3.4 (L) 3.5 - 5.1 mmol/L   Chloride 111 98 - 111 mmol/L   CO2 31 22 - 32 mmol/L   Glucose, Bld 220 (H) 70 - 99 mg/dL    Comment: Glucose reference range applies  only to samples taken after fasting for at least 8 hours.   BUN 30 (H) 6 - 20 mg/dL   Creatinine, Ser 2.95 0.61 - 1.24 mg/dL   Calcium 9.1 8.9 - 62.1 mg/dL   GFR, Estimated >30 >86 mL/min    Comment: (NOTE) Calculated using the CKD-EPI Creatinine Equation (2021)    Anion gap 8 5 - 15    Comment: Performed at [facility], [address] [place], [place] [address]  Glucose, capillary     Status: Abnormal   Collection Time: 04/16/21 11:43 AM  Result Value Ref Range   Glucose-Capillary 186 (H) 70 - 99 mg/dL    Comment: Glucose reference range applies only to samples taken after fasting for at least 8 hours.  Glucose, capillary     Status: Abnormal   Collection Time: 04/16/21  3:20 PM  Result Value Ref Range   Glucose-Capillary 176 (H) 70 - 99 mg/dL    Comment: Glucose reference range applies only to samples taken after fasting for at least 8 hours.  Basic metabolic panel     Status: Abnormal   Collection Time: 04/16/21  5:17 PM  Result Value Ref Range   Sodium 150 (H) 135 - 145 mmol/L   Potassium 3.4 (L) 3.5 - 5.1 mmol/L   Chloride 111 98 - 111 mmol/L   CO2 30 22 - 32 mmol/L   Glucose, Bld 206 (H) 70 - 99 mg/dL    Comment: Glucose reference range  applies only to samples taken after fasting for at least 8 hours.   BUN 27 (H) 6 - 20 mg/dL   Creatinine, Ser 1.61 0.61 - 1.24 mg/dL   Calcium 9.3 8.9 - 09.6 mg/dL   GFR, Estimated >04 >54 mL/min    Comment: (NOTE) Calculated using the CKD-EPI Creatinine Equation (2021)    Anion gap 9 5 - 15    Comment: Performed at [facility], [address] [place], [place] [address]  Glucose, capillary     Status: Abnormal   Collection Time: 04/16/21  8:45 PM  Result Value Ref Range   Glucose-Capillary 250 (H) 70 - 99 mg/dL    Comment: Glucose reference range applies only to samples taken after fasting for at least 8 hours.   Comment 1 Notify RN    Comment 2 Document in Chart   Basic metabolic panel     Status: Abnormal    Collection Time: 04/16/21 10:37 PM  Result Value Ref Range   Sodium 143 135 - 145 mmol/L   Potassium 3.3 (L) 3.5 - 5.1 mmol/L   Chloride 105 98 - 111 mmol/L   CO2 29 22 - 32 mmol/L   Glucose, Bld 237 (H) 70 - 99 mg/dL    Comment: Glucose reference range applies only to samples taken after fasting for at least 8 hours.   BUN 26 (H) 6 - 20 mg/dL   Creatinine, Ser 9.14 0.61 - 1.24 mg/dL   Calcium 8.8 (L) 8.9 - 10.3 mg/dL   GFR, Estimated >78 >29 mL/min    Comment: (NOTE) Calculated using the CKD-EPI Creatinine Equation (2021)    Anion gap 9 5 - 15    Comment: Performed at [facility], [address] [place], [place] [address]  Glucose, capillary     Status: Abnormal   Collection Time: 04/16/21 11:41 PM  Result Value Ref Range   Glucose-Capillary 190 (H) 70 - 99 mg/dL    Comment: Glucose reference range applies only to samples taken after fasting for at least 8 hours.   Comment 1 Notify RN    Comment 2 Document in Chart   Glucose, capillary     Status: Abnormal   Collection Time: 04/17/21  4:11 AM  Result Value Ref Range   Glucose-Capillary 169 (H) 70 - 99 mg/dL    Comment: Glucose reference range applies only to samples taken after fasting for at least 8 hours.   Comment 1 Notify RN    Comment 2 Document in Chart   Basic metabolic panel     Status: Abnormal   Collection Time: 04/17/21  4:55 AM  Result Value Ref Range   Sodium 143 135 - 145 mmol/L   Potassium 3.2 (L) 3.5 - 5.1 mmol/L   Chloride 104 98 - 111 mmol/L   CO2 29 22 - 32 mmol/L   Glucose, Bld 196 (H) 70 - 99 mg/dL    Comment: Glucose reference range applies only to samples taken after fasting for at least 8 hours.   BUN 22 (H) 6 - 20 mg/dL   Creatinine, Ser 0.86 0.61 - 1.24 mg/dL   Calcium 8.9 8.9 - 57.8 mg/dL   GFR, Estimated >46 >96 mL/min    Comment: (NOTE) Calculated using the CKD-EPI Creatinine Equation (2021)    Anion gap 10 5 - 15    Comment: Performed at [facility], [address] [place], [place] [address]  Hemoglobin A1c     Status: Abnormal   Collection  Time: 04/17/21  4:55 AM  Result Value Ref Range   Hgb A1c MFr Bld 6.9 (H) 4.8 - 5.6 %    Comment: (NOTE) Pre diabetes:          5.7%-6.4%  Diabetes:              >6.4%  Glycemic control for   <7.0% adults with diabetes    Mean Plasma Glucose 151.33 mg/dL    Comment: Performed at [facility], [address] [place], [place] [address]  Phosphorus     Status: Abnormal   Collection Time: 04/17/21  4:55 AM  Result Value Ref Range   Phosphorus 2.0 (L) 2.5 - 4.6 mg/dL    Comment: Performed at [facility], [address] [place], [place] [address]  Magnesium     Status: None   Collection Time: 04/17/21  4:55 AM  Result Value Ref Range   Magnesium 1.7 1.7 - 2.4 mg/dL    Comment: Performed at [facility], [address] [place], [place] [address]  Glucose, capillary     Status: Abnormal   Collection Time: 04/17/21  7:44 AM  Result Value Ref Range   Glucose-Capillary 195 (H) 70 - 99 mg/dL    Comment: Glucose reference range applies only to samples taken after fasting for at least 8 hours.  Glucose, capillary     Status: Abnormal   Collection Time: 04/17/21  1:29 PM  Result Value Ref Range   Glucose-Capillary 209 (H) 70 - 99 mg/dL    Comment: Glucose reference range applies only to samples taken after fasting for at least 8 hours.  Glucose, capillary     Status: Abnormal   Collection Time: 04/17/21  3:38 PM  Result Value Ref Range   Glucose-Capillary 195 (H) 70 - 99 mg/dL    Comment: Glucose reference range applies only to samples taken after fasting for at least 8 hours.    Current Facility-Administered Medications  Medication Dose Route Frequency Provider Last Rate Last Admin  . 0.9 %  sodium chloride infusion  250 mL Intravenous Continuous Rust-Chester, Cecelia ByarsBritton L, NP   Stopped at 04/14/21 1611  . Chlorhexidine Gluconate Cloth 2 % PADS 6 each  6 each Topical Q0600 Mosetta PigeonSingh,  Harmeet, MD   6 each at 04/17/21 0434  . cloNIDine (CATAPRES - Dosed in mg/24 hr) patch 0.1 mg  0.1 mg Transdermal Weekly Hazeline JunkerGrunz, Ryan B, MD      . docusate sodium (COLACE) capsule 100 mg  100 mg Oral BID PRN Vida RiggerAleskerov, Fuad, MD   100 mg at 04/16/21 2051  . dorzolamide-timolol (COSOPT) 22.3-6.8 MG/ML ophthalmic solution 1 drop  1 drop Both Eyes BID Tyrone NineGrunz, Ryan B, MD   1 drop at 04/17/21 0953  . folic acid 1 mg in sodium chloride 0.9 % 50 mL IVPB  1 mg Intravenous Daily Tyrone NineGrunz, Ryan B, MD   Stopped at 04/16/21 1900   Or  . folic acid (FOLVITE) tablet 1 mg  1 mg Oral Daily Hazeline JunkerGrunz, Ryan B, MD   1 mg at 04/17/21 0829  . haloperidol lactate (HALDOL) injection 1 mg  1 mg Intravenous Q6H PRN Nashali Ditmer T, MD      . insulin aspart (novoLOG) injection 0-9 Units  0-9 Units Subcutaneous Q4H Tyrone NineGrunz, Ryan B, MD   2 Units at 04/17/21 1613  . labetalol (NORMODYNE) injection 20 mg  20 mg Intravenous Q3H PRN Mansy, Jan A, MD   20 mg at 04/16/21 1622  .  LORazepam (ATIVAN) tablet 1-4 mg  1-4 mg Oral Q1H PRN Tyrone Nine, MD       Or  . LORazepam (ATIVAN) injection 1-4 mg  1-4 mg Intravenous Q1H PRN Tyrone Nine, MD   1 mg at 04/17/21 1423  . multivitamin with minerals tablet 1 tablet  1 tablet Oral Daily Rust-Chester, Cecelia Byars, NP   1 tablet at 04/17/21 0829  . nicotine (NICODERM CQ - dosed in mg/24 hours) patch 14 mg  14 mg Transdermal Daily Vida Rigger, MD   14 mg at 04/17/21 0830  . nystatin (MYCOSTATIN) 100000 UNIT/ML suspension 500,000 Units  5 mL Oral QID Marrion Coy, MD   500,000 Units at 04/17/21 1721  . polyethylene glycol (MIRALAX / GLYCOLAX) packet 17 g  17 g Oral Daily PRN Vida Rigger, MD      . potassium & sodium phosphates (PHOS-NAK) 280-160-250 MG packet 2 packet  2 packet Oral TID AC & HS Marrion Coy, MD   2 packet at 04/17/21 1613  . prednisoLONE acetate (PRED FORTE) 1 % ophthalmic suspension 1 drop  1 drop Right Eye TID Marrion Coy, MD   1 drop at 04/17/21 1614  . thiamine 500mg  in  normal saline (17ml) IVPB  500 mg Intravenous 45m, MD 100 mL/hr at 04/17/21 1447 500 mg at 04/17/21 1447    Musculoskeletal: Strength & Muscle Tone: decreased Gait & Station: unsteady Patient leans: N/A            Psychiatric Specialty Exam:  Presentation  General Appearance: No data recorded Eye Contact:No data recorded Speech:No data recorded Speech Volume:No data recorded Handedness:No data recorded  Mood and Affect  Mood:No data recorded Affect:No data recorded  Thought Process  Thought Processes:No data recorded Descriptions of Associations:No data recorded Orientation:No data recorded Thought Content:No data recorded History of Schizophrenia/Schizoaffective disorder:No data recorded Duration of Psychotic Symptoms:No data recorded Hallucinations:No data recorded Ideas of Reference:No data recorded Suicidal Thoughts:No data recorded Homicidal Thoughts:No data recorded  Sensorium  Memory:No data recorded Judgment:No data recorded Insight:No data recorded  Executive Functions  Concentration:No data recorded Attention Span:No data recorded Recall:No data recorded Fund of Knowledge:No data recorded Language:No data recorded  Psychomotor Activity  Psychomotor Activity:No data recorded  Assets  Assets:No data recorded  Sleep  Sleep:No data recorded  Physical Exam: Physical Exam Vitals and nursing note reviewed.  Constitutional:      Appearance: Normal appearance.  HENT:     Head: Normocephalic and atraumatic.     Mouth/Throat:     Pharynx: Oropharynx is clear.  Eyes:     Pupils: Pupils are equal, round, and reactive to light.  Cardiovascular:     Rate and Rhythm: Normal rate and regular rhythm.  Pulmonary:     Effort: Pulmonary effort is normal.     Breath sounds: Normal breath sounds.  Abdominal:     General: Abdomen is flat.     Palpations: Abdomen is soft.  Musculoskeletal:        General: Normal range of motion.        Legs:  Skin:    General: Skin is warm and dry.  Neurological:     General: No focal deficit present.     Mental Status: He is alert. Mental status is at baseline.  Psychiatric:        Attention and Perception: He is inattentive.        Mood and Affect: Mood normal. Affect is blunt.  Speech: Speech is delayed.        Behavior: Behavior is slowed.        Thought Content: Thought content normal. Thought content does not include homicidal or suicidal ideation.        Cognition and Memory: Memory is impaired.    Review of Systems  Constitutional: Negative.   HENT: Negative.   Eyes: Negative.   Respiratory: Negative.   Cardiovascular: Negative.   Gastrointestinal: Negative.   Musculoskeletal: Negative.   Skin: Negative.   Neurological: Negative.   Psychiatric/Behavioral: Positive for hallucinations and memory loss. Negative for depression and suicidal ideas. The patient is not nervous/anxious and does not have insomnia.    Blood pressure 111/71, pulse 88, temperature 100 F (37.8 C), resp. rate 20, height 5\' 8"  (1.727 m), weight 64.3 kg, SpO2 97 %. Body mass index is 21.55 kg/m.  Treatment Plan Summary: Medication management and Plan 60 year old man acutely agitated when he presented to the hospital probably related to multiple acute medical problems.  He is now awake alert and no longer floridly delirious although still having visual hallucinations.  Reassured patient that visual hallucinations could be still related to alcohol withdrawal or just the recovering delirium in the hospital.  Did not add standing antipsychotics but added orders for as needed antipsychotics if he sundown's or gets agitated at night.  Patient is cooperative with treatment and not acutely dangerous and no longer meets commitment criteria.  Involuntary commitment paperwork discontinued.  Disposition: No evidence of imminent risk to self or others at present.   Patient does not meet criteria for  psychiatric inpatient admission. Supportive therapy provided about ongoing stressors.  42, MD 04/17/2021 8:03 PM

## 2021-04-17 NOTE — Evaluation (Signed)
Clinical/Bedside Swallow Evaluation Patient Details  Name: Isaac Morrison MRN: [id number] Date of Birth: [birth year]  Today's Date: 04/17/2021 Time: SLP Start Time (ACUTE ONLY): 0805 SLP Stop Time (ACUTE ONLY): 0900 SLP Time Calculation (min) (ACUTE ONLY): 55 min  Past Medical History:  Past Medical History:  Diagnosis Date  . Diabetes mellitus without complication (HCC)   . Glaucoma   . Hyperlipidemia   . Hypertension    Past Surgical History:  Past Surgical History:  Procedure Laterality Date  . REPLACEMENT TOTAL KNEE BILATERAL Bilateral   . Skin grafts    . 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  He has a history of chronic alcoholism, lifelong smoking history.  Brother of patient was present and was able to provide additional details including cocaine history.  Patient denies seizures in the past.  During interview patient is able to answer questions and is able to tell me that he is having visual and auditory hallucinations which are new for him and have lasted approximately 3 to 4 days.  Brother of patient states that he has been with decreased mentation and altered sensorium in the past 1 week.  Patient has severe AKI and is admitted to the medical intensive care unit for requirement of emergent dialysis.  Pt lives w/ his Brother who stated that the patient is a daily drinker, at least a sixpack a day but has not had any alcohol to drink for few days, and the last 3 days has not had any food or fluid intake due to feeling ill.  Head CT and CXR revealed No acute issues.  Pt is on CIWA protocol.   Assessment / Plan / Recommendation Clinical Impression  Pt appears to present w/ grossly adequate oropharyngeal phase swallow function in light of declined mental status  secondary to impact of ETOH abuse; on CIWA protocol. Pt exhibited distraction; impulsivity. This presentation can impact his overall awareness, judgement, and safety during po tasks which increases risk for aspiration, choking. This risk can be reduced when following general aspiration precautions and being monitored during oral intake at this time. Pt also requires assistance w/ self-feeding d/t UE weakness and min apraxic-like movements w/ UEs -- suspect related to the ETOH abuse. He was unable to manipulate a utensil for self-feeding but managed finger foods most of the time. He required verbal/ visual cues during po tasks. Pt consumed trials of ice chip, thin liquids, purees, and soft solids w/ No immediate, overt clinical s/s of aspiration noted; no decline in vocal quality during few verbalizations, no cough, and no decline in respiratory status during/post trials. Oral phase was adequate for bolus management, mastication, and oral clearing of the boluses given. OM Exam revealed No unilateral weakness though a Thrush-like appearance noted w/ full tongue coated white. D/t pt's declined mental status, and risk for aspiration, recommend modifying the regular diet somewhat to more finger foods w/ thin liquids w/ aspiration precautions; reduce Distractions during meals and have Supervision to aid w/ feeding and monitoring of any impulsivity during oral intake. Pills in Puree for safer swallowing. Dietician f/u and OT f/u for use of UEs during ADLs, self-feeding tasks. NSG updated. ST services can be available for reconsult for further needs while admitted. MD updated. Precautions posted in room. SLP Visit Diagnosis: Dysphagia, unspecified (R13.10)  Aspiration Risk   (reduced following precautions)    Diet Recommendation  Regular foods w/ cut meats/foods for easier self-feeding -- Finger Foods; Thin liquids. Monitor for any impulsive feeding behavior. General aspiration precautions. Supervision at meals  initially d/t mental status decline.   Medication Administration: Whole meds with puree (for safer swallowing)    Other  Recommendations Recommended Consults:  (Dietician; OT f/u) Oral Care Recommendations: Oral care BID;Staff/trained caregiver to provide oral care Other Recommendations:  (n/a)   Follow up Recommendations None      Frequency and Duration  (n/a)   (n/a)       Prognosis Prognosis for Safe Diet Advancement: Fair (-Good) Barriers to Reach Goals: Cognitive deficits;Motivation;Time post onset;Severity of deficits;Behavior (ETOH abuse/use)      Swallow Study   General Date of Onset: 04/12/21 HPI: Pt is a 60 year old male with a 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  He has a history of chronic alcoholism, lifelong smoking history.  Brother of patient was present and was able to provide additional details including cocaine history.  Patient denies seizures in the past.  During interview patient is able to answer questions and is able to tell me that he is having visual and auditory hallucinations which are new for him and have lasted approximately 3 to 4 days.  Brother of patient states that he has been with decreased mentation and altered sensorium in the past 1 week.  Patient has severe AKI and is admitted to the medical intensive care unit for requirement of emergent dialysis.  Pt lives w/ his Brother who stated that the patient is a daily drinker, at least a sixpack a day but has not had any alcohol to drink for few days, and the last 3 days has not had any food or fluid intake due to feeling ill.  Head CT and CXR revealed No acute issues.  Pt is on CIWA protocol. Type of Study: Bedside Swallow Evaluation Previous Swallow Assessment: none Diet Prior to this Study: Regular;Thin liquids Temperature Spikes Noted: No (wbc  4.6) Respiratory Status: Room air History of Recent Intubation: No Behavior/Cognition: Alert;Cooperative;Pleasant mood;Confused;Distractible;Requires cueing Psychiatrist) Oral Cavity Assessment:  (white coating covering tongue) Oral Care Completed by SLP: Yes Oral Cavity - Dentition: Missing dentition Vision: Functional for self-feeding Self-Feeding Abilities: Able to feed self;Needs assist;Needs set up (distracted; impulsive) Patient Positioning: Upright in bed (needed support) Baseline Vocal Quality: Normal Volitional Cough: Strong Volitional Swallow: Able to elicit    Oral/Motor/Sensory Function Overall Oral Motor/Sensory Function: Within functional limits   Ice Chips Ice chips: Within functional limits Presentation: Spoon (fed; 2 trials)   Thin Liquid Thin Liquid: Within functional limits Presentation: Cup;Self Fed;Straw (supported cup - 4-5 trials via each)    Nectar Thick Nectar Thick Liquid: Not tested   Honey Thick Honey Thick Liquid: Not tested   Puree Puree: Within functional limits Presentation: Spoon (fed mostly - 10 trials)   Solid     Solid: Within functional limits (grossly) Presentation: Self Fed (w/ finger foods, supported - 10+ trials)        Jerilynn Som, MS, [employer] [facility] [id number] [facility] 04/17/2021,3:03 PM

## 2021-04-17 NOTE — Progress Notes (Signed)
PROGRESS NOTE    Isaac Morrison  YQM:[id number] DOB: 08/19/1961 DOA: 04/12/2021 PCP: Dan Maker, MD   Chief complaint.  Altered mental status. Brief Narrative:  Isaac Morrison is a 60 y.o. male with a history of childhood osteosarcoma s/p AKA, lifelong tobacco use, alcoholism, T2DM, HLD, HTN who presented to the ED 4/29 with 1 week of worsening delirium found to have severe renal failure with hyperkalemia and acidosis. He was admitted to the ICU, underwent HD with improvement in metabolic parameters. Mentation has remained severely altered and ativan has been given for alcohol withdrawal. He was transferred to the medical floor 5/1 on hospitalist service as of 5/2. Sodium has trended up to 157 and improving with D5W. Nephrology is on board.  5/4.  Renal function had improved, however, patient still has significant encephalopathy, concerning for Wernicke's encephalopathy, start high-dose of thiamine.   Assessment & Plan:   Active Problems:   Acute renal failure (HCC)  #1.  Acute renal failure secondary to dehydration. Hypernatremia secondary dehydration. Initial hyperkalemia, now hypokalemia. High anion gap metabolic acidosis. Hypophosphatemia. Renal function had improved, metabolic acidosis resolved, sodium level normalized. We will also supplement phosphorus orally.  #2.  Alcohol abuse with alcohol withdrawal. Warnicke encephalopathy. Thrush. Patient initially treated with thiamine and folic acid and CIWA protocol.  Patient has persistent encephalopathy after treatment.  He still has significant visual hallucination, unsteady gait.  Condition most consistent with Warnicke encephalopathy.  Patient will be treated with thiamine 500 mg every 8 hours for 2 days, followed by 250 mg daily for 3 days. He was found to have significant thrush, nystatin started.  #3.  Hypertension with hypertension urgency. Blood pressure is better.  4.  Polysubstance abuse. Continue to follow. Patient  has been seen by speech therapy, no significant dysphagia. Patient developed significant hypokalemia, will continue supplement.  #5.  Thrombocytopenia. Appears to be secondary to alcohol abuse, but I will discontinue heparin.  Start SCDs.  #6.  Uncontrolled type 2 diabetes with hyperglycemia Continue sliding scale insulin for now.    DVT prophylaxis: SCDs Code Status: Full Family Communication: brother updated Disposition Plan:  .   Status is: Inpatient  Remains inpatient appropriate because:Inpatient level of care appropriate due to severity of illness   Dispo: The patient is from: Home              Anticipated d/c is to: Home              Patient currently is not medically stable to d/c.   Difficult to place patient No        I/O last 3 completed shifts: In: 3098.4 [P.O.:240; I.V.:2858.4] Out: 2750 [Urine:2750] No intake/output data recorded.     Consultants:   none  Procedures: none  Antimicrobials:  none  Subjective: Spoke with the nurse, patient still requiring sitter, he is confused, he still seeing things on the wall.  He was unsteady with walking. Spoke with speech therapy, patient does not have significant dysphagia, he has thrush, nystatin started. Spoke with patient brother, patient did not have any recent injury to the shoulder, he had a remote shoulder surgery 2 years ago. Currently, patient denies any short of breath or cough. No fever chills pain No dysuria hematuria. No short of breath or cough.  Objective: Vitals:   04/16/21 2004 04/16/21 2340 04/17/21 0415 04/17/21 0742  BP: (!) 154/99 138/90 (!) 143/93 125/78  Pulse: 76 67 74 78  Resp: 15 18 15 14   Temp: 98.2 F (  36.8 C) 98.1 F (36.7 C) 98.4 F (36.9 C) 98.2 F (36.8 C)  TempSrc: Axillary  Oral   SpO2: 98% 97% 98% 95%  Weight:      Height:        Intake/Output Summary (Last 24 hours) at 04/17/2021 1002 Last data filed at 04/17/2021 0600 Gross per 24 hour  Intake 2000 ml   Output 800 ml  Net 1200 ml   Filed Weights   04/13/21 0504 04/14/21 0500 04/15/21 0500  Weight: 69.5 kg 64.1 kg 64.3 kg    Examination:  General exam: Appears calm and comfortable  Respiratory system: Clear to auscultation. Respiratory effort normal. Cardiovascular system: S1 & S2 heard, RRR. No JVD, murmurs, rubs, gallops or clicks. No pedal edema. Gastrointestinal system: Abdomen is nondistended, soft and nontender. No organomegaly or masses felt. Normal bowel sounds heard. Central nervous system: Alert and oriented x2. No focal neurological deficits. Extremities: Symmetric 5 x 5 power. Skin: No rashes, lesions or ulcers Psychiatry:  Mood & affect appropriate.     Data Reviewed: I have personally reviewed following labs and imaging studies  CBC: Recent Labs  Lab 04/12/21 1559 04/13/21 0457 04/14/21 0313 04/15/21 0758  WBC 11.7* 6.1 4.9 4.6  NEUTROABS 10.7*  --   --   --   HGB 15.1 12.3* 13.4 14.8  HCT 42.0 33.9* 37.7* 44.1  MCV 89.2 88.5 90.8 93.8  PLT 207 126* 123* 126*   Basic Metabolic Panel: Recent Labs  Lab 04/12/21 2228 04/13/21 0457 04/14/21 0313 04/15/21 0758 04/15/21 1614 04/16/21 0406 04/16/21 1058 04/16/21 1717 04/16/21 2237 04/17/21 0455  NA 139 138 147* 155*  157*   < > 152* 150* 150* 143 143  K 5.4* 3.3* 4.2 4.3  4.3   < > 3.4* 3.4* 3.4* 3.3* 3.2*  CL 100 97* 108 114*  115*   < > 113* 111 111 105 104  CO2 13* 23 28 26  28    < > 29 31 30 29 29   GLUCOSE 174* 186* 138* 175*  180*   < > 225* 220* 206* 237* 196*  BUN 266* 117* 75* 52*  53*   < > 36* 30* 27* 26* 22*  CREATININE 13.80* 6.02* 1.50* 1.04  1.04   < > 0.85 0.86 0.81 0.73 0.74  CALCIUM 8.5* 8.0* 8.5* 9.3  9.5   < > 9.2 9.1 9.3 8.8* 8.9  MG 2.7* 2.1 2.0 2.1  --  2.0  --   --   --  1.7  PHOS 13.7* 7.2* 3.7 1.8*  --   --   --   --   --  2.0*   < > = values in this interval not displayed.   GFR: Estimated Creatinine Clearance: 91.5 mL/min (by C-G formula based on SCr of 0.74  mg/dL). Liver Function Tests: Recent Labs  Lab 04/12/21 1559 04/12/21 2228 04/13/21 0457 04/15/21 0758  AST 10* 11* 13*  --   ALT 17 16 16   --   ALKPHOS 77 59 54  --   BILITOT 1.5* 1.2 1.4*  --   PROT 7.5 6.2* 5.6*  --   ALBUMIN 3.8 3.1* 2.8* 3.8   No results for input(s): LIPASE, AMYLASE in the last 168 hours. No results for input(s): AMMONIA in the last 168 hours. Coagulation Profile: Recent Labs  Lab 04/12/21 1559  INR 1.2   Cardiac Enzymes: Recent Labs  Lab 04/12/21 1745  CKTOTAL 588*   BNP (last 3 results) No results for input(s):  PROBNP in the last 8760 hours. HbA1C: No results for input(s): HGBA1C in the last 72 hours. CBG: Recent Labs  Lab 04/16/21 1520 04/16/21 2045 04/16/21 2341 04/17/21 0411 04/17/21 0744  GLUCAP 176* 250* 190* 169* 195*   Lipid Profile: No results for input(s): CHOL, HDL, LDLCALC, TRIG, CHOLHDL, LDLDIRECT in the last 72 hours. Thyroid Function Tests: No results for input(s): TSH, T4TOTAL, FREET4, T3FREE, THYROIDAB in the last 72 hours. Anemia Panel: No results for input(s): VITAMINB12, FOLATE, FERRITIN, TIBC, IRON, RETICCTPCT in the last 72 hours. Sepsis Labs: Recent Labs  Lab 04/12/21 1559  LATICACIDVEN 1.4    Recent Results (from the past 240 hour(s))  Urine culture     Status: Abnormal   Collection Time: 04/12/21  3:45 PM   Specimen: In/Out Cath Urine  Result Value Ref Range Status   Specimen Description   Final    IN/OUT CATH URINE Performed at [facility], [address] [place], [place] [address]    Special Requests   Final    NONE Performed at [facility], [address] [place], [place] [address]    Culture MULTIPLE SPECIES PRESENT, SUGGEST RECOLLECTION (A)  Final   Report Status 04/14/2021 FINAL  Final  Resp Panel by RT-PCR (Flu A&B, Covid) Nasopharyngeal Swab     Status: None   Collection Time: 04/12/21  4:12 PM   Specimen: Nasopharyngeal Swab; Nasopharyngeal(NP) swabs in vial  transport medium  Result Value Ref Range Status   SARS Coronavirus 2 by RT PCR NEGATIVE NEGATIVE Final    Comment: (NOTE) SARS-CoV-2 target nucleic acids are NOT DETECTED.  The SARS-CoV-2 RNA is generally detectable in upper respiratory specimens during the acute phase of infection. The lowest concentration of SARS-CoV-2 viral copies this assay can detect is 138 copies/mL. A negative result does not preclude SARS-Cov-2 infection and should not be used as the sole basis for treatment or other patient management decisions. A negative result may occur with  improper specimen collection/handling, submission of specimen other than nasopharyngeal swab, presence of viral mutation(s) within the areas targeted by this assay, and inadequate number of viral copies(<138 copies/mL). A negative result must be combined with clinical observations, patient history, and epidemiological information. The expected result is Negative.  Fact Sheet for Patients:  BloggerCourse.com  Fact Sheet for Healthcare Providers:  SeriousBroker.it  This test is no t yet approved or cleared by the [place] FDA and  has been authorized for detection and/or diagnosis of SARS-CoV-2 by FDA under an Emergency Use Authorization (EUA). This EUA will remain  in effect (meaning this test can be used) for the duration of the COVID-19 declaration under Section 564(b)(1) of the Act, 21 U.S.C.section 360bbb-3(b)(1), unless the authorization is terminated  or revoked sooner.       Influenza A by PCR NEGATIVE NEGATIVE Final   Influenza B by PCR NEGATIVE NEGATIVE Final    Comment: (NOTE) The Xpert Xpress SARS-CoV-2/FLU/RSV plus assay is intended as an aid in the diagnosis of influenza from Nasopharyngeal swab specimens and should not be used as a sole basis for treatment. Nasal washings and aspirates are unacceptable for Xpert Xpress SARS-CoV-2/FLU/RSV testing.  Fact  Sheet for Patients: BloggerCourse.com  Fact Sheet for Healthcare Providers: SeriousBroker.it  This test is not yet approved or cleared by the [place] FDA and has been authorized for detection and/or diagnosis of SARS-CoV-2 by FDA under an Emergency Use Authorization (EUA). This EUA will remain in effect (meaning this test can be used) for  the duration of the COVID-19 declaration under Section 564(b)(1) of the Act, 21 U.S.C. section 360bbb-3(b)(1), unless the authorization is terminated or revoked.  Performed at [facility], [address] [place], [place] [address]   Blood Culture (routine x 2)     Status: None   Collection Time: 04/12/21  4:12 PM   Specimen: BLOOD  Result Value Ref Range Status   Specimen Description BLOOD BLOOD RIGHT WRIST  Final   Special Requests   Final    BOTTLES DRAWN AEROBIC AND ANAEROBIC Blood Culture adequate volume   Culture   Final    NO GROWTH 5 DAYS Performed at [facility], [address] [place], [place] [address]    Report Status 04/17/2021 FINAL  Final  Blood Culture (routine x 2)     Status: None   Collection Time: 04/12/21  4:17 PM   Specimen: BLOOD  Result Value Ref Range Status   Specimen Description BLOOD LEFT ANTECUBITAL  Final   Special Requests   Final    BOTTLES DRAWN AEROBIC AND ANAEROBIC Blood Culture adequate volume   Culture   Final    NO GROWTH 5 DAYS Performed at [facility], [address] [place], [place] [address]    Report Status 04/17/2021 FINAL  Final         Radiology Studies: No results found.      Scheduled Meds: . amLODipine  5 mg Oral Daily  . Chlorhexidine Gluconate Cloth  6 each Topical Q0600  . cloNIDine  0.1 mg Transdermal Weekly  . dorzolamide-timolol  1 drop Both Eyes BID  . folic acid  1 mg Oral Daily  . heparin injection (subcutaneous)  5,000 Units Subcutaneous Q8H  . insulin aspart  0-9 Units  Subcutaneous Q4H  . multivitamin with minerals  1 tablet Oral Daily  . nicotine  14 mg Transdermal Daily  . nystatin  5 mL Oral QID  . potassium & sodium phosphates  2 packet Oral TID AC & HS  . prednisoLONE acetate  1 drop Right Eye TID   Continuous Infusions: . sodium chloride Stopped (04/14/21 1611)  . folic acid (FOLVITE) IVPB Stopped (04/16/21 1900)  . potassium chloride 10 mEq (04/17/21 0952)  . thiamine injection       LOS: 5 days    Time spent: 32 minutes    Marrion Coyekui Bryant Lipps, MD [facility]   To contact the attending provider between 7A-7P or the covering provider during after hours 7P-7A, please log into the web site [email or web address] and access using universal [place] password for that web site. If you do not have the password, please call the hospital operator.  04/17/2021, 10:02 AM

## 2021-04-17 NOTE — TOC Transition Note (Signed)
Transition of Care [facility]) - CM/SW Discharge Note   Patient Details  Name: Keyvin Rison MRN: [id number] Date of Birth: March 24, 1961  Transition of Care [facility]) CM/SW Contact:  Caryn Section, RN Phone Number: 04/17/2021, 9:26 AM   Clinical Narrative:   TOC in to see patient, pleasant but complaining of pain and wanting to see the nurse, no engaging in conversation.  TOC contacted patient's brother via phone.  Brother states that he has no concerns about patient coming home.  Brother denies concerns about patient getting to appointments or getting medications, states "we handle that ok".   States that patient has been previously unresponsive to substance counseling resources and has never attended meetings or sought counseling.    TOC offered resources like Al-Anon to brother, explaining purpose.  Brother politely declined at this time, and stated he would seek such services if her felt necessary.  Brother asked to be notified upon discharge.  TOC/care team will notify brother when patient is being discharged.  TOC contact information given to brother.    Final next level of care:  (TBD) Barriers to Discharge: Continued Medical Work up   Patient Goals and CMS Choice Patient states their goals for this hospitalization and ongoing recovery are:: Brother states to have him get better and come home.   Choice offered to / list presented to : NA  Discharge Placement                       Discharge Plan and Services   Discharge Planning Services: CM Consult Post Acute Care Choice: NA          DME Arranged:  (TBD)         HH Arranged:  (TBD)          Social Determinants of Health (SDOH) Interventions     Readmission Risk Interventions No flowsheet data found.

## 2021-04-17 NOTE — Progress Notes (Signed)
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  He has a history of chronic alcoholism, lifelong smoking history.  Brother of patient was present and was able to provide additional details including cocaine history.  Patient denies seizures in the past.  During interview patient is able to answer questions and is able to tell me that he is having visual and auditory hallucinations which are new for him and have lasted approximately 3 to 4 days.  Brother of patient states that he has been with decreased mentation and altered sensorium in the past 1 week.  Patient has severe AKI and is admitted to the medical intensive care unit for requirement of emergent dialysis.  Patient was seen by nephrology and plan for HD overnight.  04/13/21- patient had HD overnight with significant improvement in renal function. His mentation declined overnight and there is plan for CT head today. I suspect this is related to metabolic derrangement with sedation delivered overnight.  04/14/21- patient improved significantly. He is able to speak slowly now. Blood work with imrpoved renal indices post HD.  He continues to require PRN ativan per CIWA for EtOH withrdawal. Optimizing for TRH transfer today.   04/15/21- patient is stable, he is speaking whispering, states he knows he is in [place] but that's all, still confused under anxiolytics for CIWA.  His renal function appears to have improved to baseline now.  PCCM will  sign off at this time and available if needed.     04/17/21- patient is improved, he does not require dialysis at this time. He has been with signs of etoh withdrawal and has restarted nourishement with continued electrolyte derrangements possibly due to refeeding syndrome. He is slow to answer but is not combative and seems comfortable despite some tremor and diaphoresis presumably from ongoing withdrawal symptoms.   Lines/tubes :   Microbiology/Sepsis markers: Results for orders placed or performed during the hospital encounter of 04/12/21  Urine culture     Status: Abnormal   Collection Time: 04/12/21  3:45 PM   Specimen: In/Out Cath Urine  Result Value Ref Range Status   Specimen Description   Final    IN/OUT CATH URINE Performed at [facility], [address], [place], [place] [address]    Special Requests   Final    NONE Performed at [facility], [address] [place], [place] [address]    Culture MULTIPLE SPECIES PRESENT, SUGGEST RECOLLECTION (A)  Final   Report Status 04/14/2021 FINAL  Final  Resp Panel by RT-PCR (Flu A&B, Covid) Nasopharyngeal Swab     Status: None   Collection Time: 04/12/21  4:12 PM   Specimen: Nasopharyngeal Swab; Nasopharyngeal(NP) swabs in vial transport medium  Result Value Ref Range Status   SARS Coronavirus 2 by RT PCR NEGATIVE NEGATIVE Final    Comment: (NOTE) SARS-CoV-2 target nucleic acids are NOT DETECTED.  The SARS-CoV-2 RNA is generally detectable in upper respiratory specimens during the acute phase of infection. The lowest concentration of SARS-CoV-2 viral copies this assay can detect is 138 copies/mL. A negative result does not preclude SARS-Cov-2 infection and should not be used as the sole basis for  treatment or other patient management decisions. A negative result may occur with  improper specimen collection/handling, submission of specimen other than nasopharyngeal swab, presence of viral mutation(s) within  the areas targeted by this assay, and inadequate number of viral copies(<138 copies/mL). A negative result must be combined with clinical observations, patient history, and epidemiological information. The expected result is Negative.  Fact Sheet for Patients:  BloggerCourse.com  Fact Sheet for Healthcare Providers:  SeriousBroker.it  This test is no t yet approved or cleared by the [place] FDA and  has been authorized for detection and/or diagnosis of SARS-CoV-2 by FDA under an Emergency Use Authorization (EUA). This EUA will remain  in effect (meaning this test can be used) for the duration of the COVID-19 declaration under Section 564(b)(1) of the Act, 21 U.S.C.section 360bbb-3(b)(1), unless the authorization is terminated  or revoked sooner.       Influenza A by PCR NEGATIVE NEGATIVE Final   Influenza B by PCR NEGATIVE NEGATIVE Final    Comment: (NOTE) The Xpert Xpress SARS-CoV-2/FLU/RSV plus assay is intended as an aid in the diagnosis of influenza from Nasopharyngeal swab specimens and should not be used as a sole basis for treatment. Nasal washings and aspirates are unacceptable for Xpert Xpress SARS-CoV-2/FLU/RSV testing.  Fact Sheet for Patients: BloggerCourse.com  Fact Sheet for Healthcare Providers: SeriousBroker.it  This test is not yet approved or cleared by the [place] FDA and has been authorized for detection and/or diagnosis of SARS-CoV-2 by FDA under an Emergency Use Authorization (EUA). This EUA will remain in effect (meaning this test can be used) for the duration of the COVID-19 declaration under Section 564(b)(1) of the Act, 21 U.S.C. section 360bbb-3(b)(1), unless the authorization is terminated or revoked.  Performed at [facility], [address] [place], [place] [address]   Blood Culture (routine x 2)     Status: None    Collection Time: 04/12/21  4:12 PM   Specimen: BLOOD  Result Value Ref Range Status   Specimen Description BLOOD BLOOD RIGHT WRIST  Final   Special Requests   Final    BOTTLES DRAWN AEROBIC AND ANAEROBIC Blood Culture adequate volume   Culture   Final    NO GROWTH 5 DAYS Performed at [facility], [address] [place], [place] [address]    Report Status 04/17/2021 FINAL  Final  Blood Culture (routine x 2)     Status: None   Collection Time: 04/12/21  4:17 PM   Specimen: BLOOD  Result Value Ref Range Status   Specimen Description BLOOD LEFT ANTECUBITAL  Final   Special Requests   Final    BOTTLES DRAWN AEROBIC AND ANAEROBIC Blood Culture adequate volume   Culture   Final    NO GROWTH 5 DAYS Performed at [facility], [address], [place], [place] [address]    Report Status 04/17/2021 FINAL  Final    Anti-infectives:  Anti-infectives (From admission, onward)   Start     Dose/Rate Route Frequency Ordered Stop   04/12/21 1615  cefTRIAXone (ROCEPHIN) 2 g in sodium chloride 0.9 % 100 mL IVPB  Status:  Discontinued        2 g 200 mL/hr over 30 Minutes Intravenous Every 24 hours 04/12/21 1611 04/14/21 1255   04/12/21 1615  azithromycin (ZITHROMAX) 500 mg in sodium chloride 0.9 % 250 mL IVPB  Status:  Discontinued        500 mg 250 mL/hr over 60 Minutes Intravenous Every 24  hours 04/12/21 1611 04/14/21 1255        PAST MEDICAL HISTORY   Past Medical History:  Diagnosis Date  . Diabetes mellitus without complication (HCC)   . Glaucoma   . Hyperlipidemia   . Hypertension      SURGICAL HISTORY   Past Surgical History:  Procedure Laterality Date  . REPLACEMENT TOTAL KNEE BILATERAL Bilateral   . Skin grafts    . TOTAL SHOULDER REPLACEMENT Right      FAMILY HISTORY   Family History  Problem Relation Age of Onset  . Alzheimer's disease Father      SOCIAL HISTORY   Social History   Tobacco Use  . Smoking status: Never Smoker  .  Smokeless tobacco: Never Used  Substance Use Topics  . Alcohol use: Yes    Alcohol/week: 49.0 standard drinks    Types: 49 Cans of beer per week  . Drug use: No     MEDICATIONS   Current Medication:  Current Facility-Administered Medications:  .  0.9 %  sodium chloride infusion, 250 mL, Intravenous, Continuous, Rust-Chester, Cecelia Byars, NP, Stopped at 04/14/21 1611 .  amLODipine (NORVASC) tablet 5 mg, 5 mg, Oral, Daily, Hazeline Junker B, MD, 5 mg at 04/16/21 1717 .  Chlorhexidine Gluconate Cloth 2 % PADS 6 each, 6 each, Topical, Q0600, Mosetta Pigeon, MD, 6 each at 04/17/21 0434 .  cloNIDine (CATAPRES - Dosed in mg/24 hr) patch 0.1 mg, 0.1 mg, Transdermal, Weekly, Hazeline Junker B, MD .  docusate sodium (COLACE) capsule 100 mg, 100 mg, Oral, BID PRN, Vida Rigger, MD, 100 mg at 04/16/21 2051 .  dorzolamide-timolol (COSOPT) 22.3-6.8 MG/ML ophthalmic solution 1 drop, 1 drop, Both Eyes, BID, Tyrone Nine, MD, 1 drop at 04/17/21 0953 .  folic acid 1 mg in sodium chloride 0.9 % 50 mL IVPB, 1 mg, Intravenous, Daily, Stopped at 04/16/21 1900 **OR** folic acid (FOLVITE) tablet 1 mg, 1 mg, Oral, Daily, Hazeline Junker B, MD, 1 mg at 04/17/21 0829 .  heparin injection 1,000-6,000 Units, 1,000-6,000 Units, Intravenous, BID PRN, Rust-Chester, Britton L, NP, 2,800 Units at 04/13/21 0038 .  heparin injection 5,000 Units, 5,000 Units, Subcutaneous, Q8H, Vida Rigger, MD, 5,000 Units at 04/17/21 0435 .  insulin aspart (novoLOG) injection 0-9 Units, 0-9 Units, Subcutaneous, Q4H, Tyrone Nine, MD, 2 Units at 04/17/21 [phone] .  labetalol (NORMODYNE) injection 20 mg, 20 mg, Intravenous, Q3H PRN, Mansy, Jan A, MD, 20 mg at 04/16/21 1622 .  LORazepam (ATIVAN) tablet 1-4 mg, 1-4 mg, Oral, Q1H PRN **OR** LORazepam (ATIVAN) injection 1-4 mg, 1-4 mg, Intravenous, Q1H PRN, Tyrone Nine, MD, 1 mg at 04/16/21 1225 .  morphine 2 MG/ML injection 2 mg, 2 mg, Intravenous, Q4H PRN, Tyrone Nine, MD, 2 mg at 04/16/21 1036 .   multivitamin with minerals tablet 1 tablet, 1 tablet, Oral, Daily, Rust-Chester, Britton L, NP, 1 tablet at 04/17/21 0829 .  nicotine (NICODERM CQ - dosed in mg/24 hours) patch 14 mg, 14 mg, Transdermal, Daily, Karna Christmas, Goldia Ligman, MD, 14 mg at 04/17/21 0830 .  nystatin (MYCOSTATIN) 100000 UNIT/ML suspension 500,000 Units, 5 mL, Oral, QID, Marrion Coy, MD, 500,000 Units at 04/17/21 0956 .  polyethylene glycol (MIRALAX / GLYCOLAX) packet 17 g, 17 g, Oral, Daily PRN, Karna Christmas, Javonn Gauger, MD .  potassium & sodium phosphates (PHOS-NAK) 280-160-250 MG packet 2 packet, 2 packet, Oral, TID AC & HS, Zhang, Dekui, MD .  potassium chloride 10 mEq in 100 mL IVPB, 10 mEq, Intravenous, Q1 Hr x  3, Marrion Coy, MD, Last Rate: 100 mL/hr at 04/17/21 0952, 10 mEq at 04/17/21 0952 .  prednisoLONE acetate (PRED FORTE) 1 % ophthalmic suspension 1 drop, 1 drop, Right Eye, TID, Marrion Coy, MD .  thiamine tablet 100 mg, 100 mg, Oral, Daily, 100 mg at 04/17/21 0829 **OR** thiamine (B-1) injection 100 mg, 100 mg, Intravenous, Daily, Rust-Chester, Britton L, NP, 100 mg at 04/16/21 0908    ALLERGIES   Patient has no known allergies.    REVIEW OF SYSTEMS     10 point ROS conducted exam except for hallucinations both auditory and visual.  PHYSICAL EXAMINATION   Vital Signs: Temp:  [97.7 F (36.5 C)-98.4 F (36.9 C)] 98.2 F (36.8 C) (05/04 0742) Pulse Rate:  [67-78] 78 (05/04 0742) Resp:  [13-18] 14 (05/04 0742) BP: (125-183)/(78-118) 125/78 (05/04 0742) SpO2:  [95 %-98 %] 95 % (05/04 0742)  GENERAL: Older than stated age, no apparent distress.  Becomes intermittently sedated post CIWA meds HEAD: Normocephalic, atraumatic.  EYES: Pupils equal, round, reactive to light.  No scleral icterus.  Positive conjunctivitis  MOUTH: Moist mucosal membrane. NECK: Supple. No thyromegaly. No nodules. No JVD.  PULMONARY: Clear to auscultation with mild rhonchorous breath sounds bilaterally CARDIOVASCULAR: S1 and S2. Regular  rate and rhythm. No murmurs, rubs, or gallops.  GASTROINTESTINAL: Soft, nontender, non-distended. No masses. Positive bowel sounds. No hepatosplenomegaly.  MUSCULOSKELETAL: No swelling, clubbing, or edema.  NEUROLOGIC: Altered sensorium with no focal deficit SKIN:intact,warm,dry   PERTINENT DATA     Infusions: . sodium chloride Stopped (04/14/21 1611)  . folic acid (FOLVITE) IVPB Stopped (04/16/21 1900)  . potassium chloride 10 mEq (04/17/21 0952)   Scheduled Medications: . amLODipine  5 mg Oral Daily  . Chlorhexidine Gluconate Cloth  6 each Topical Q0600  . cloNIDine  0.1 mg Transdermal Weekly  . dorzolamide-timolol  1 drop Both Eyes BID  . folic acid  1 mg Oral Daily  . heparin injection (subcutaneous)  5,000 Units Subcutaneous Q8H  . insulin aspart  0-9 Units Subcutaneous Q4H  . multivitamin with minerals  1 tablet Oral Daily  . nicotine  14 mg Transdermal Daily  . nystatin  5 mL Oral QID  . potassium & sodium phosphates  2 packet Oral TID AC & HS  . prednisoLONE acetate  1 drop Right Eye TID  . thiamine  100 mg Oral Daily   Or  . thiamine  100 mg Intravenous Daily   PRN Medications: docusate sodium, heparin, labetalol, LORazepam **OR** LORazepam, morphine injection, polyethylene glycol Hemodynamic parameters:   Intake/Output: 05/03 0701 - 05/04 0700 In: 2000 [P.O.:240; I.V.:1760] Out: 800 [Urine:800]  Ventilator  Settings:    LAB RESULTS:  Basic Metabolic Panel: Recent Labs  Lab 04/12/21 2228 04/13/21 0457 04/14/21 0313 04/15/21 0758 04/15/21 1614 04/16/21 0406 04/16/21 1058 04/16/21 1717 04/16/21 2237 04/17/21 0455  NA 139 138 147* 155*  157*   < > 152* 150* 150* 143 143  K 5.4* 3.3* 4.2 4.3  4.3   < > 3.4* 3.4* 3.4* 3.3* 3.2*  CL 100 97* 108 114*  115*   < > 113* 111 111 105 104  CO2 13* 23 28 26  28    < > 29 31 30 29 29   GLUCOSE 174* 186* 138* 175*  180*   < > 225* 220* 206* 237* 196*  BUN 266* 117* 75* 52*  53*   < > 36* 30* 27* 26* 22*   CREATININE 13.80* 6.02* 1.50* 1.04  1.04   < >  0.85 0.86 0.81 0.73 0.74  CALCIUM 8.5* 8.0* 8.5* 9.3  9.5   < > 9.2 9.1 9.3 8.8* 8.9  MG 2.7* 2.1 2.0 2.1  --  2.0  --   --   --  1.7  PHOS 13.7* 7.2* 3.7 1.8*  --   --   --   --   --  2.0*   < > = values in this interval not displayed.   Liver Function Tests: Recent Labs  Lab 04/12/21 1559 04/12/21 2228 04/13/21 0457 04/15/21 0758  AST 10* 11* 13*  --   ALT 17 16 16   --   ALKPHOS 77 59 54  --   BILITOT 1.5* 1.2 1.4*  --   PROT 7.5 6.2* 5.6*  --   ALBUMIN 3.8 3.1* 2.8* 3.8   No results for input(s): LIPASE, AMYLASE in the last 168 hours. No results for input(s): AMMONIA in the last 168 hours. CBC: Recent Labs  Lab 04/12/21 1559 04/13/21 0457 04/14/21 0313 04/15/21 0758  WBC 11.7* 6.1 4.9 4.6  NEUTROABS 10.7*  --   --   --   HGB 15.1 12.3* 13.4 14.8  HCT 42.0 33.9* 37.7* 44.1  MCV 89.2 88.5 90.8 93.8  PLT 207 126* 123* 126*   Cardiac Enzymes: Recent Labs  Lab 04/12/21 1745  CKTOTAL 588*   BNP: Invalid input(s): POCBNP CBG: Recent Labs  Lab 04/16/21 1520 04/16/21 2045 04/16/21 2341 04/17/21 0411 04/17/21 0744  GLUCAP 176* 250* 190* 169* 195*       IMAGING RESULTS:  Imaging: No results found. @PROBHOSP @ No results found.        ASSESSMENT AND PLAN    -Multidisciplinary rounds held today   Renal Failure-AKI stage V-              -resolved  - 04/14/21- Creat significantly imrpoved and UOP has increased -s/p emergent dialysis -follow UO -continue Foley Catheter-assess need daily -Nephrology team on case-will leave riple-lumen dialysis catheter until Renal team asks to remove  Altered mental status with confusion-improved -Likely due to toxic metabolic encephalopathy secondary to severe AKI -with Alcoholic encephalopathy and withdrawal and concomitant effects of benzo via CIWA   High anion gap metabolic acidosis   -Patient is not with hyperglycemia, suspect metabolic derangements and  lactic acidosis   -Currently receiving sodium bicarb infusion and planning for emergent dialysis   Severe hyperkalemia -Repeat twelve-lead EKG -peaked T waves and PR prolongation noted - deliver 1amp calcium gloconate -emergent HD -Lokelma ordered  -Renal team on case appreciate input -pharmacy consultation   Chronic alcoholism - CIWA protocol -monitor for alcohol withdrawal syndrome -Ativan PRN ordered   Polysubstance abuse -Cocaine & tobacco history  -monitor for withdrawal -urine drug screen ordered -nicotine patch 14mg    Hx of right lower extermity osteosarcoma  - s/p AKA at age 60  -fall precautions while with altered sensorium  - may need 1:1 sitter  ID -continue IV abx as prescibed -follow up cultures  GI/Nutrition GI PROPHYLAXIS as indicated DIET-->TF's as tolerated Constipation protocol as indicated  ENDO - ICU hypoglycemic\Hyperglycemia protocol -check FSBS per protocol    DVT/GI PRX ordered -SCDs  TRANSFUSIONS AS NEEDED MONITOR FSBS ASSESS the need for LABS as needed      Vida RiggerFuad Delle Andrzejewski, M.D.  Division of Pulmonary & Critical Care Medicine  [facility]

## 2021-04-17 NOTE — Consult Note (Signed)
PHARMACY CONSULT NOTE  Pharmacy Consult for Electrolyte Monitoring and Replacement   Recent Labs: Potassium (mmol/L)  Date Value  04/17/2021 3.2 (L)   Magnesium (mg/dL)  Date Value  31/49/7026 1.7   Calcium (mg/dL)  Date Value  37/85/8850 8.9   Albumin (g/dL)  Date Value  27/74/1287 3.8   Phosphorus (mg/dL)  Date Value  86/76/7209 2.0 (L)   Sodium (mmol/L)  Date Value  04/17/2021 143   Assessment: Patient w/ severe AKI/hyperkalemia and admitted to the medical intensive care unit 4/29 for requirement of emergent dialysis. Patient with severe hyperkalemia ( K 7.9), acodisos (CO2 12) and uremia (BUN 215)  Patient was seen by nephrology and received emergent HD on 4/29. No further sessions have been necessary.  Renal function markedly improved. 2426 mL UOP yesterday.  Goal of Therapy:  Electrolytes WNL   Plan:  --Na 143. Nephrology and Primary team following. --K 3.2, MD placed order for KCL IV x 3 doses --Phos 2.0. MD has placed order for Phos-NAK 2 packets x 4 doses --Follow-up electrolytes with AM labs tomorrow  Bettey Costa 04/17/2021 2:08 PM

## 2021-04-17 NOTE — Progress Notes (Signed)
PT Cancellation Note  Patient Details Name: Isaac Morrison MRN: [id number] DOB: November 29, 1961   Cancelled Treatment:    Reason Eval/Treat Not Completed: Fatigue/lethargy limiting ability to participate (Consult received and chart reviewed.  Patient currently sleeping soundly, s/p recent administration of anxiolytics per CIWA.  Unable to awaken, maintain alertness for participation with session.  Will continue efforts at later time/date as appropriate.)   Brilynn Biasi H. Manson Passey, PT, DPT, NCS 04/17/21, 2:53 PM [phone]

## 2021-04-18 LAB — CBC WITH DIFFERENTIAL/PLATELET
Abs Immature Granulocytes: 0.04 10*3/uL (ref 0.00–0.07)
Basophils Absolute: 0 10*3/uL (ref 0.0–0.1)
Basophils Relative: 0 %
Eosinophils Absolute: 0.1 10*3/uL (ref 0.0–0.5)
Eosinophils Relative: 2 %
HCT: 41.9 % (ref 39.0–52.0)
Hemoglobin: 15.1 g/dL (ref 13.0–17.0)
Immature Granulocytes: 1 %
Lymphocytes Relative: 24 %
Lymphs Abs: 2.1 10*3/uL (ref 0.7–4.0)
MCH: 32 pg (ref 26.0–34.0)
MCHC: 36 g/dL (ref 30.0–36.0)
MCV: 88.8 fL (ref 80.0–100.0)
Monocytes Absolute: 0.4 10*3/uL (ref 0.1–1.0)
Monocytes Relative: 5 %
Neutro Abs: 6 10*3/uL (ref 1.7–7.7)
Neutrophils Relative %: 68 %
Platelets: 99 10*3/uL — ABNORMAL LOW (ref 150–400)
RBC: 4.72 MIL/uL (ref 4.22–5.81)
RDW: 11.9 % (ref 11.5–15.5)
WBC: 8.8 10*3/uL (ref 4.0–10.5)
nRBC: 0 % (ref 0.0–0.2)

## 2021-04-18 LAB — BASIC METABOLIC PANEL
Anion gap: 8 (ref 5–15)
BUN: 16 mg/dL (ref 6–20)
CO2: 26 mmol/L (ref 22–32)
Calcium: 8.8 mg/dL — ABNORMAL LOW (ref 8.9–10.3)
Chloride: 108 mmol/L (ref 98–111)
Creatinine, Ser: 0.72 mg/dL (ref 0.61–1.24)
GFR, Estimated: >60 mL/min (ref >60)
Glucose, Bld: 145 mg/dL — ABNORMAL HIGH (ref 70–99)
Potassium: 3.7 mmol/L (ref 3.5–5.1)
Sodium: 142 mmol/L (ref 135–145)

## 2021-04-18 LAB — GLUCOSE, CAPILLARY
Glucose-Capillary: 133 mg/dL — ABNORMAL HIGH (ref 70–99)
Glucose-Capillary: 148 mg/dL — ABNORMAL HIGH (ref 70–99)
Glucose-Capillary: 167 mg/dL — ABNORMAL HIGH (ref 70–99)
Glucose-Capillary: 188 mg/dL — ABNORMAL HIGH (ref 70–99)
Glucose-Capillary: 194 mg/dL — ABNORMAL HIGH (ref 70–99)
Glucose-Capillary: 200 mg/dL — ABNORMAL HIGH (ref 70–99)
Glucose-Capillary: 218 mg/dL — ABNORMAL HIGH (ref 70–99)

## 2021-04-18 LAB — PHOSPHORUS: Phosphorus: 2.6 mg/dL (ref 2.5–4.6)

## 2021-04-18 LAB — MAGNESIUM: Magnesium: 1.6 mg/dL — ABNORMAL LOW (ref 1.7–2.4)

## 2021-04-18 MED ORDER — LOPERAMIDE HCL 2 MG PO CAPS
2.0000 mg | ORAL_CAPSULE | ORAL | Status: DC | PRN
  Administered 2021-04-18: 11:00:00 2 mg via ORAL
  Filled 2021-04-18: qty 1

## 2021-04-18 MED ORDER — MAGNESIUM SULFATE 2 GM/50ML IV SOLN
2.0000 g | Freq: Once | INTRAVENOUS | Status: AC
  Administered 2021-04-18: 08:00:00 2 g via INTRAVENOUS
  Filled 2021-04-18: qty 50

## 2021-04-18 MED ORDER — POTASSIUM PHOSPHATES 15 MMOLE/5ML IV SOLN: 20.0000 mmol | Freq: Once | INTRAVENOUS | Status: DC

## 2021-04-18 NOTE — Evaluation (Signed)
Physical Therapy Evaluation Patient Details Name: Isaac Morrison MRN: [id number] DOB: 11-29-1961 Today's Date: 04/18/2021   History of Present Illness  Pt is a 60 y.o. male presenting to hospital 4/29 with generalized weakness and confusion.  Pt admitted with renal failure-AKI stage V, AMS with confusion, high anion gap metabolic acidosis, severe hyperkalemia, chronic alcoholism, and h/o polysubstance abuse.  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  Per chart (per pt's brother) pt is a daily drinker.  Clinical Impression  Pt sitting edge of bed finishing OT session upon PT arrival.  Prior to hospital admission, pt reports being ambulatory with R LE prosthesis; lives with family in 1 level home with 4 STE no railing.  Currently pt is CGA to min assist x2 with transfers and walking a few feet bed to [facility] with RW and R LE prosthesis.  Pt started to put R prosthetic sleeve on in sitting but reports he finishes it with standing so pt stood to finish putting prosthetic sleeve on, put some lotion on prosthetic sleeve (pt reports this is how he always does it to improve process), and then put on R LE prosthesis (pt requiring CGA to min assist x2 for safety and balance in standing during this).  Pt initially unable to take steps and pt reporting it was because his R LE prosthesis was not "charged"; therapist asked pt to clarify and pt reporting that it needed to be charged so it had power to walk--pt declining to walk d/t this but then pt suddenly adamant on walking despite initial concerns from pt.  Then pt reporting needing to toilet for bowel movement.  Pt transferred back to bed after using BSC (prosthesis and sleeve doffed).  Impulsiveness and decreased safety awareness noted during sessions activities; confusion also noted during session.  Extra time required during session for all activities.  Pt would benefit from skilled PT to  address noted impairments and functional limitations (see below for any additional details).  Upon hospital discharge, pt would benefit from SNF.    Follow Up Recommendations SNF    Equipment Recommendations  Rolling walker with 5" wheels;3in1 (PT)    Recommendations for Other Services OT consult     Precautions / Restrictions Precautions Precautions: Fall Precaution Comments: aspiration Restrictions Weight Bearing Restrictions: No Other Position/Activity Restrictions: h/o R AKA      Mobility  Bed Mobility Overal bed mobility: Needs Assistance Bed Mobility: Sit to Supine     Sit to supine: Supervision;HOB elevated   General bed mobility comments: SBA for safety    Transfers Overall transfer level: Needs assistance Equipment used: Rolling walker (2 wheeled);None Transfers: Sit to/from Stand Sit to Stand: Min guard;Min assist;+2 safety/equipment         General transfer comment: x2 trials standing from bed (R UE support on bed rail); stand pivot BSC to bed min assist x2 for safety and balance  Ambulation/Gait Ambulation/Gait assistance: Min guard;Min assist;+2 physical assistance Gait Distance (Feet): 3 Feet (bed to [facility] with R LE prosthesis) Assistive device: Rolling walker (2 wheeled)   Gait velocity: decreased   General Gait Details: increased difficulty and time to take steps; decreased B LE step length/foot clearance  Stairs            Wheelchair Mobility    Modified Rankin (Stroke Patients Only)       Balance Overall balance assessment: Needs assistance Sitting-balance support: No upper extremity supported;Feet  supported;Feet unsupported Sitting balance-Leahy Scale: Good Sitting balance - Comments: steady sitting reaching within Morrison   Standing balance support: Single extremity supported Standing balance-Leahy Scale: Fair Standing balance comment: pt requires at least single UE support for static standing balance                              Pertinent Vitals/Pain Pain Assessment: 0-10 Pain Score: 8  Faces Pain Scale: Hurts little more Pain Location: R shoulder, L hip, L knee Pain Descriptors / Indicators: Aching Pain Intervention(s): Limited activity within patient's tolerance;Monitored during session;Premedicated before session;Repositioned  Vitals (HR and O2 on room air) stable and WFL throughout treatment session.    Home Living Family/patient expects to be discharged to:: Private residence Living Arrangements: Other relatives (brother and nephew) Available Help at Discharge: Family (pt's family) Type of Home: Mobile home Home Access: Stairs to enter Entrance Stairs-Rails: None Entrance Stairs-Number of Steps: 4 Home Layout: One level Home Equipment: Environmental consultant - 2 wheels;Crutches;Cane - single point      Prior Function Level of Independence: Needs assistance   Gait / Transfers Assistance Needed: Pt reports always using R LE prosthesis; sometimes uses SPC for household mobility and sometimes RW for outside mobility d/t uneven ground; uses SPC and crutch for tub/shower transfers  ADL's / Homemaking Assistance Needed: Per OT eval "Pt reports mod indep with seated shower on a overturned 5 gallon bucket, dressing, prosthesis mgt, driving, and "sometimes" needs reminders to take his medication. Pt reports drinking ~6-pack/day and reports he does not feel like it is an issue".  Comments: Pt reports 1 fall early this morning but no falls at home     Hand Dominance   Dominant Hand: Right    Extremity/Trunk Assessment   Upper Extremity Assessment Upper Extremity Assessment: Defer to OT evaluation (h/o R rotator cuff injury/surgery)    Lower Extremity Assessment Lower Extremity Assessment: RLE deficits/detail (L LE WFL) RLE Deficits / Details: R AKA; at least 3/5 AROM hip flexion and hip abd/adduction    Cervical / Trunk Assessment Cervical / Trunk Assessment: Normal  Communication   Communication: No  difficulties  Cognition Arousal/Alertness: Awake/alert Behavior During Therapy: Impulsive Overall Cognitive Status: No family/caregiver present to determine baseline cognitive functioning                                 General Comments: Oriented to person, place, general situation; easily distracted; impulsive      General Comments   Nursing cleared pt for participation in physical therapy.  Pt agreeable to PT session.    Exercises    Assessment/Plan    PT Assessment Patient needs continued PT services  PT Problem List Decreased strength;Decreased activity tolerance;Decreased balance;Decreased mobility;Decreased cognition;Decreased knowledge of use of DME;Decreased safety awareness;Decreased knowledge of precautions;Pain       PT Treatment Interventions DME instruction;Gait training;Stair training;Functional mobility training;Therapeutic activities;Therapeutic exercise;Balance training;Patient/family education    PT Goals (Current goals can be found in the Care Plan section)  Acute Rehab PT Goals Patient Stated Goal: go home PT Goal Formulation: With patient Time For Goal Achievement: 05/02/21 Potential to Achieve Goals: Fair    Frequency Min 2X/week   Barriers to discharge Decreased caregiver support      Co-evaluation               AM-PAC PT "6 Clicks" Mobility  Outcome Measure Help  needed turning from your back to your side while in a flat bed without using bedrails?: None Help needed moving from lying on your back to sitting on the side of a flat bed without using bedrails?: A Little Help needed moving to and from a bed to a chair (including a wheelchair)?: A Little Help needed standing up from a chair using your arms (e.g., wheelchair or bedside chair)?: A Little Help needed to walk in hospital room?: A Lot Help needed climbing 3-5 steps with a railing? : Total 6 Click Score: 16    End of Session Equipment Utilized During Treatment: Gait  belt Activity Tolerance: Patient tolerated treatment well Patient left: in bed;with call bell/phone within reach;with bed alarm set;with nursing/sitter in room;Other (comment) Psychiatrist present) Nurse Communication: Mobility status;Precautions PT Visit Diagnosis: Unsteadiness on feet (R26.81);Other abnormalities of gait and mobility (R26.89);Muscle weakness (generalized) (M62.81);History of falling (Z91.81)    Time: 5732-2025 PT Time Calculation (min) (ACUTE ONLY): 53 min   Charges:   PT Evaluation $PT Eval Low Complexity: 1 Low PT Treatments $Therapeutic Activity: 38-52 mins       Daniella Dewberry, PT 04/18/21, 1:32 PM

## 2021-04-18 NOTE — Progress Notes (Signed)
Pt found on floor in room at 2220. Confused. Denied any pain when asked. Able to move all extremities WNL. VSS. MD notified. Pt's brother Zaylan Kissoon notified.

## 2021-04-18 NOTE — Evaluation (Signed)
Occupational Therapy Evaluation Patient Details Name: Isaac Morrison MRN: [id number] DOB: November 25, 1961 Today's Date: 04/18/2021    History of Present Illness 60 y.o. male with a history of childhood osteosarcoma s/p AKA, lifelong tobacco use, alcoholism, T2DM, HLD, HTN who presented to the ED 4/29 with 1 week of worsening delirium found to have severe renal failure with hyperkalemia and acidosis. He was admitted to the ICU, underwent HD with improvement in metabolic parameters.   Clinical Impression   Pt was seen for OT evaluation this date. Prior to hospital admission, pt reports being modified independent with seated shower (sits on overturned 5 gallon bucket in shower), using SPC + crutch for tub transfers, using SPC PRN for household mobility, and sometimes using RW in the yard 2/2 uneven ground. Pt lives with his brother and nephew in 1 story mobile home, 4 steps, no rails. Pt denies falls other than last night/this morning. Pt initially a bit sleepy/lethargic but improves alertness with session. Pt performs bed mobility with CGA, cues for hand and foot placement to use RW for ADL transfer EOB requiring MIN A and cues for safety/RW mgt training to negotiate obstacles. Pt performed without prosthesis which pt notes he has at end of session. PT present for evaluation at end of session. Currently pt demonstrates impairments as described below (See OT problem list) which functionally limit his ability to perform ADL/self-care tasks. Pt currently requires MIN A for LB ADL, Min A for ADL transfers, and cues for safety. Pt currently not at baseline independence level. Pt would benefit from skilled OT services to address noted impairments and functional limitations (see below for any additional details) in order to maximize safety and independence while minimizing falls risk and caregiver burden. Upon hospital discharge, recommend STR to maximize pt safety and return to PLOF.     Follow Up Recommendations   SNF    Equipment Recommendations  Tub/shower bench    Recommendations for Other Services       Precautions / Restrictions Precautions Precautions: Fall Restrictions Other Position/Activity Restrictions: hx R AKA      Mobility Bed Mobility Overal bed mobility: Needs Assistance Bed Mobility: Supine to Sit;Sit to Supine     Supine to sit: Min guard;HOB elevated Sit to supine: Min guard;HOB elevated        Transfers Overall transfer level: Needs assistance Equipment used: Rolling walker (2 wheeled) Transfers: Sit to/from Stand Sit to Stand: Min assist         General transfer comment: cues for hands/foot placement, no RLE prosthesis used    Balance Overall balance assessment: Needs assistance Sitting-balance support: No upper extremity supported;Feet unsupported;Feet supported Sitting balance-Leahy Scale: Good     Standing balance support: Bilateral upper extremity supported Standing balance-Leahy Scale: Poor Standing balance comment: requires BUE support on RW                           ADL either performed or assessed with clinical judgement   ADL Overall ADL's : Needs assistance/impaired                                       General ADL Comments: Pt requires MIN A for LB ADL tasks, MIN A for ADL transfers, cues for safety     Vision Baseline Vision/History: Wears glasses Wears Glasses: Reading only Patient Visual Report: No change from baseline  Perception     Praxis      Pertinent Vitals/Pain Pain Assessment: 0-10 Pain Score: 8  Pain Location: R shoulder, L hip, L knee Pain Descriptors / Indicators: Aching Pain Intervention(s): Limited activity within patient's tolerance;Monitored during session;Premedicated before session;Repositioned     Hand Dominance Right   Extremity/Trunk Assessment Upper Extremity Assessment Upper Extremity Assessment: Generalized weakness (hx R shoulder RTC injury and surgeries)    Lower Extremity Assessment Lower Extremity Assessment: Generalized weakness (hx R AKA)       Communication Communication Communication: No difficulties   Cognition Arousal/Alertness: Lethargic;Awake/alert Behavior During Therapy: Impulsive Overall Cognitive Status: No family/caregiver present to determine baseline cognitive functioning                                 General Comments: Pt lethargic initially but becomes more alert, oriented to self, place, and here "for drinking and my sugar", a bit impulsive, requires cues for safety, easily distracted   General Comments       Exercises Other Exercises Other Exercises: ADL transfer training, dyn standing balance, RW mgt   Shoulder Instructions      Home Living Family/patient expects to be discharged to:: Private residence Living Arrangements: Other relatives (pt reports living with his brother and nephew) Available Help at Discharge: Family (pt reports "they can be there") Type of Home: Mobile home Home Access: Stairs to enter Entrance Stairs-Number of Steps: 4 Entrance Stairs-Rails: None Home Layout: One level     Bathroom Shower/Tub: Chief Strategy Officer: Standard     Home Equipment: Environmental consultant - 2 wheels;Crutches;Cane - single point          Prior Functioning/Environment Level of Independence: Needs assistance  Gait / Transfers Assistance Needed: Pt reports using prosthesis always, sometimes using SPC for household mobility, sometimes using RW for outside mobility 2/2 uneven ground, and using SPC and crutch for tub/shower transfers ADL's / Homemaking Assistance Needed: Pt reports mod indep with seated shower on a overturned 5 gallon bucket, dressing, prosthesis mgt, driving, and "sometimes" needs reminders to take his medication. Pt reports drinking ~6-pack/day and reports he does not feel like it is an issue   Comments: Pt endorses 1 fall early this morning, but no falls at home         OT Problem List: Decreased strength;Pain;Decreased range of motion;Decreased safety awareness;Decreased activity tolerance;Impaired balance (sitting and/or standing);Decreased knowledge of use of DME or AE      OT Treatment/Interventions: Self-care/ADL training;Therapeutic exercise;Therapeutic activities;DME and/or AE instruction;Patient/family education;Balance training    OT Goals(Current goals can be found in the care plan section) Acute Rehab OT Goals Patient Stated Goal: go home OT Goal Formulation: With patient Time For Goal Achievement: 05/02/21 Potential to Achieve Goals: Good ADL Goals Pt Will Perform Lower Body Dressing: sit to/from stand;with supervision Pt Will Transfer to Toilet: ambulating;with supervision (LRAD, prosthesis) Pt Will Perform Toileting - Clothing Manipulation and hygiene: with supervision;sit to/from stand Additional ADL Goal #1: Pt will perform daily am ADL routine with supervision for safety  OT Frequency: Min 2X/week   Barriers to D/C:            Co-evaluation              AM-PAC OT "6 Clicks" Daily Activity     Outcome Measure Help from another person eating meals?: None Help from another person taking care of personal grooming?: A Little Help from  another person toileting, which includes using toliet, bedpan, or urinal?: A Lot Help from another person bathing (including washing, rinsing, drying)?: A Little Help from another person to put on and taking off regular upper body clothing?: None Help from another person to put on and taking off regular lower body clothing?: A Little 6 Click Score: 19   End of Session Equipment Utilized During Treatment: Gait belt;Rolling walker  Activity Tolerance: Patient tolerated treatment well Patient left: in bed;with call bell/phone within reach;with nursing/sitter in room;Other (comment) (PT in room, sitter in room)  OT Visit Diagnosis: Other abnormalities of gait and mobility (R26.89);Muscle weakness  (generalized) (M62.81);Other symptoms and signs involving cognitive function;Pain Pain - Right/Left: Right Pain - part of body: Shoulder (and L hip, L knee)                Time: 2725-3664 OT Time Calculation (min): 26 min Charges:  OT General Charges $OT Visit: 1 Visit OT Evaluation $OT Eval Moderate Complexity: 1 Mod OT Treatments $Self Care/Home Management : 8-22 mins  Wynona Canes, MPH, MS, OTR/L ascom [phone] 04/18/21, 1:04 PM

## 2021-04-18 NOTE — Progress Notes (Addendum)
PROGRESS NOTE    Isaac Morrison AlertBilly Kyler  ZOX:[id number] DOB: [birth year] DOA: 04/12/2021 PCP: Dan MakerFleming, Ron D, MD   Chief complaint.  Altered mental status. Brief Narrative:   Zenda AlpersBilly Fortneris a59 y.o.malewith a history of childhood osteosarcoma s/p AKA, lifelong tobacco use, alcoholism, T2DM, HLD, HTN who presented to the ED 4/29 with 1 week of worsening delirium found to have severe renal failure with hyperkalemia and acidosis. He was admitted to the ICU, underwent HD with improvement in metabolic parameters. Mentation has remained severely altered and ativan has been given for alcohol withdrawal. He was transferred to the medical floor 5/1 on hospitalist service as of 5/2. Sodium has trended up to 157and improving with D5W. Nephrology is on board.  5/4.  Renal function had improved, however, patient still has significant encephalopathy, concerning for Wernicke's encephalopathy, start high-dose of thiamine.  Assessment & Plan:   Principal Problem:   Alcohol withdrawal delirium (HCC) Active Problems:   Acute renal failure (HCC)   Hypernatremia   Wernicke encephalopathy  #1.  Acute renal failure secondary to dehydration. Hypernatremia secondary dehydration. Initial hyperkalemia, now hypokalemia. High anion gap metabolic acidosis. Hypophosphatemia. Hypomagnesemia.  Conditions improved.  Continue supplement magnesium today.  Recheck BMP mag and Phos tomorrow.  Renal function normalized.   #2.  Alcohol abuse with alcohol withdrawal. Warnicke encephalopathy. Thrush. Patient no longer has any significant confusion, but is still has visual hallucination.  Unsteady gait.  Continue high-dose thiamine. Patient will be monitored closely. Also started nystatin for thrush.  3.  Diarrhea. Patient has some loose stool today,  Abdominal pain or nausea vomiting.  No fever or chills.  Lower possibility of C. difficile.  We will give Imodium.  4.  Hypertension and hypertension urgency. Blood  pressures  5.  Thrombocytopenia. Secondary to alcohol abuse.  Platelets dropped down to 99 today.  We will send HIT antibody.  Follow CBC.  #6.  Polysubstance abuse.  7.  Uncontrolled type 2 diabetes with hyperglycemia. Continue current regimen.    DVT prophylaxis: SCDs Code Status: Full Family Communication:  Disposition Plan:  .   Status is: Inpatient  Remains inpatient appropriate because:Inpatient level of care appropriate due to severity of illness   Dispo: The patient is from: Home              Anticipated d/c is to: Home              Patient currently is not medically stable to d/c.   Difficult to place patient No        I/O last 3 completed shifts: In: 2000 [P.O.:240; I.V.:1760] Out: 2400 [Urine:2400] Total I/O In: -  Out: 300 [Urine:300]     Consultants:   None  Procedures: None  Antimicrobials: None  Subjective: Patient had a large loose stools last night, but no nausea vomiting or abdominal pain.  Good appetite this morning. Patient does not have any confusion today, but still seeing bugs in his bed. No short of breath or cough. No dysuria or hematuria. No fever or chills. No headache or dizziness. No chest pain or palpitation.  Objective: Vitals:   04/17/21 2243 04/18/21 0037 04/18/21 0334 04/18/21 0752  BP: 125/75 132/84 138/87 (!) 145/91  Pulse: 87 86 86 90  Resp:  16 16 14   Temp: 99.4 F (37.4 C) 98.5 F (36.9 C) 99.5 F (37.5 C) 98.6 F (37 C)  TempSrc: Oral Oral Oral Oral  SpO2: 98% 98% 96% 96%  Weight:      Height:  Intake/Output Summary (Last 24 hours) at 04/18/2021 1059 Last data filed at 04/18/2021 0955 Gross per 24 hour  Intake --  Output 1300 ml  Net -1300 ml   Filed Weights   04/13/21 0504 04/14/21 0500 04/15/21 0500  Weight: 69.5 kg 64.1 kg 64.3 kg    Examination:  General exam: Appears calm and comfortable  Respiratory system: Clear to auscultation. Respiratory effort normal. Cardiovascular system:  S1 & S2 heard, RRR. No JVD, murmurs, rubs, gallops or clicks. No pedal edema. Gastrointestinal system: Abdomen is nondistended, soft and nontender. No organomegaly or masses felt. Normal bowel sounds heard. Central nervous system: Alert and oriented x3. No focal neurological deficits. Extremities: Right AKA. Skin: No rashes, lesions or ulcers Psychiatry:  Mood & affect appropriate.     Data Reviewed: I have personally reviewed following labs and imaging studies  CBC: Recent Labs  Lab 04/12/21 1559 04/13/21 0457 04/14/21 0313 04/15/21 0758 04/18/21 0535  WBC 11.7* 6.1 4.9 4.6 8.8  NEUTROABS 10.7*  --   --   --  6.0  HGB 15.1 12.3* 13.4 14.8 15.1  HCT 42.0 33.9* 37.7* 44.1 41.9  MCV 89.2 88.5 90.8 93.8 88.8  PLT 207 126* 123* 126* 99*   Basic Metabolic Panel: Recent Labs  Lab 04/13/21 0457 04/14/21 0313 04/15/21 0758 04/15/21 1614 04/16/21 0406 04/16/21 1058 04/16/21 1717 04/16/21 2237 04/17/21 0455 04/18/21 0535  NA 138 147* 155*  157*   < > 152* 150* 150* 143 143 142  K 3.3* 4.2 4.3  4.3   < > 3.4* 3.4* 3.4* 3.3* 3.2* 3.7  CL 97* 108 114*  115*   < > 113* 111 111 105 104 108  CO2 23 28 26  28    < > 29 31 30 29 29 26   GLUCOSE 186* 138* 175*  180*   < > 225* 220* 206* 237* 196* 145*  BUN 117* 75* 52*  53*   < > 36* 30* 27* 26* 22* 16  CREATININE 6.02* 1.50* 1.04  1.04   < > 0.85 0.86 0.81 0.73 0.74 0.72  CALCIUM 8.0* 8.5* 9.3  9.5   < > 9.2 9.1 9.3 8.8* 8.9 8.8*  MG 2.1 2.0 2.1  --  2.0  --   --   --  1.7 1.6*  PHOS 7.2* 3.7 1.8*  --   --   --   --   --  2.0* 2.6   < > = values in this interval not displayed.   GFR: Estimated Creatinine Clearance: 91.5 mL/min (by C-G formula based on SCr of 0.72 mg/dL). Liver Function Tests: Recent Labs  Lab 04/12/21 1559 04/12/21 2228 04/13/21 0457 04/15/21 0758  AST 10* 11* 13*  --   ALT 17 16 16   --   ALKPHOS 77 59 54  --   BILITOT 1.5* 1.2 1.4*  --   PROT 7.5 6.2* 5.6*  --   ALBUMIN 3.8 3.1* 2.8* 3.8   No  results for input(s): LIPASE, AMYLASE in the last 168 hours. No results for input(s): AMMONIA in the last 168 hours. Coagulation Profile: Recent Labs  Lab 04/12/21 1559  INR 1.2   Cardiac Enzymes: Recent Labs  Lab 04/12/21 1745  CKTOTAL 588*   BNP (last 3 results) No results for input(s): PROBNP in the last 8760 hours. HbA1C: Recent Labs    04/17/21 0455  HGBA1C 6.9*   CBG: Recent Labs  Lab 04/17/21 1538 04/17/21 2059 04/18/21 0044 04/18/21 0337 04/18/21 0754  GLUCAP 195*  173* 188* 133* 148*   Lipid Profile: No results for input(s): CHOL, HDL, LDLCALC, TRIG, CHOLHDL, LDLDIRECT in the last 72 hours. Thyroid Function Tests: No results for input(s): TSH, T4TOTAL, FREET4, T3FREE, THYROIDAB in the last 72 hours. Anemia Panel: No results for input(s): VITAMINB12, FOLATE, FERRITIN, TIBC, IRON, RETICCTPCT in the last 72 hours. Sepsis Labs: Recent Labs  Lab 04/12/21 1559  LATICACIDVEN 1.4    Recent Results (from the past 240 hour(s))  Urine culture     Status: Abnormal   Collection Time: 04/12/21  3:45 PM   Specimen: In/Out Cath Urine  Result Value Ref Range Status   Specimen Description   Final    IN/OUT CATH URINE Performed at [facility], [address], [place], [place] [address]    Special Requests   Final    NONE Performed at [facility], [address] [place], [place] [address]    Culture MULTIPLE SPECIES PRESENT, SUGGEST RECOLLECTION (A)  Final   Report Status 04/14/2021 FINAL  Final  Resp Panel by RT-PCR (Flu A&B, Covid) Nasopharyngeal Swab     Status: None   Collection Time: 04/12/21  4:12 PM   Specimen: Nasopharyngeal Swab; Nasopharyngeal(NP) swabs in vial transport medium  Result Value Ref Range Status   SARS Coronavirus 2 by RT PCR NEGATIVE NEGATIVE Final    Comment: (NOTE) SARS-CoV-2 target nucleic acids are NOT DETECTED.  The SARS-CoV-2 RNA is generally detectable in upper respiratory specimens during the acute phase  of infection. The lowest concentration of SARS-CoV-2 viral copies this assay can detect is 138 copies/mL. A negative result does not preclude SARS-Cov-2 infection and should not be used as the sole basis for treatment or other patient management decisions. A negative result may occur with  improper specimen collection/handling, submission of specimen other than nasopharyngeal swab, presence of viral mutation(s) within the areas targeted by this assay, and inadequate number of viral copies(<138 copies/mL). A negative result must be combined with clinical observations, patient history, and epidemiological information. The expected result is Negative.  Fact Sheet for Patients:  BloggerCourse.com  Fact Sheet for Healthcare Providers:  SeriousBroker.it  This test is no t yet approved or cleared by the [place] FDA and  has been authorized for detection and/or diagnosis of SARS-CoV-2 by FDA under an Emergency Use Authorization (EUA). This EUA will remain  in effect (meaning this test can be used) for the duration of the COVID-19 declaration under Section 564(b)(1) of the Act, 21 U.S.C.section 360bbb-3(b)(1), unless the authorization is terminated  or revoked sooner.       Influenza A by PCR NEGATIVE NEGATIVE Final   Influenza B by PCR NEGATIVE NEGATIVE Final    Comment: (NOTE) The Xpert Xpress SARS-CoV-2/FLU/RSV plus assay is intended as an aid in the diagnosis of influenza from Nasopharyngeal swab specimens and should not be used as a sole basis for treatment. Nasal washings and aspirates are unacceptable for Xpert Xpress SARS-CoV-2/FLU/RSV testing.  Fact Sheet for Patients: BloggerCourse.com  Fact Sheet for Healthcare Providers: SeriousBroker.it  This test is not yet approved or cleared by the [place] FDA and has been authorized for detection and/or diagnosis of  SARS-CoV-2 by FDA under an Emergency Use Authorization (EUA). This EUA will remain in effect (meaning this test can be used) for the duration of the COVID-19 declaration under Section 564(b)(1) of the Act, 21 U.S.C. section 360bbb-3(b)(1), unless the authorization is terminated or revoked.  Performed at [facility], [address] [place], [place]  [address]   Blood Culture (routine x 2)     Status: None   Collection Time: 04/12/21  4:12 PM   Specimen: BLOOD  Result Value Ref Range Status   Specimen Description BLOOD BLOOD RIGHT WRIST  Final   Special Requests   Final    BOTTLES DRAWN AEROBIC AND ANAEROBIC Blood Culture adequate volume   Culture   Final    NO GROWTH 5 DAYS Performed at [facility], [address] [place], [place] [address]    Report Status 04/17/2021 FINAL  Final  Blood Culture (routine x 2)     Status: None   Collection Time: 04/12/21  4:17 PM   Specimen: BLOOD  Result Value Ref Range Status   Specimen Description BLOOD LEFT ANTECUBITAL  Final   Special Requests   Final    BOTTLES DRAWN AEROBIC AND ANAEROBIC Blood Culture adequate volume   Culture   Final    NO GROWTH 5 DAYS Performed at [facility], [address], [place], [place] [address]    Report Status 04/17/2021 FINAL  Final         Radiology Studies: No results found.      Scheduled Meds: . cloNIDine  0.1 mg Transdermal Weekly  . dorzolamide-timolol  1 drop Both Eyes BID  . folic acid  1 mg Oral Daily  . insulin aspart  0-9 Units Subcutaneous Q4H  . multivitamin with minerals  1 tablet Oral Daily  . nicotine  14 mg Transdermal Daily  . nystatin  5 mL Oral QID  . prednisoLONE acetate  1 drop Right Eye TID   Continuous Infusions: . sodium chloride Stopped (04/14/21 1611)  . folic acid (FOLVITE) IVPB Stopped (04/16/21 1900)  . thiamine injection 500 mg (04/18/21 0612)     LOS: 6 days    Time spent: 34 minutes    Marrion Coy, MD [facility]   To contact the attending provider between 7A-7P or the covering provider during after hours 7P-7A, please log into the web site [email or web address] and access using universal [place] password for that web site. If you do not have the password, please call the hospital operator.  04/18/2021, 10:59 AM

## 2021-04-18 NOTE — Progress Notes (Signed)
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  He has a history of chronic alcoholism, lifelong smoking history.  Brother of patient was present and was able to provide additional details including cocaine history.  Patient denies seizures in the past.  During interview patient is able to answer questions and is able to tell me that he is having visual and auditory hallucinations which are new for him and have lasted approximately 3 to 4 days.  Brother of patient states that he has been with decreased mentation and altered sensorium in the past 1 week.  Patient has severe AKI and is admitted to the medical intensive care unit for requirement of emergent dialysis.  Patient was seen by nephrology and plan for HD overnight.  04/13/21- patient had HD overnight with significant improvement in renal function. His mentation declined overnight and there is plan for CT head today. I suspect this is related to metabolic derrangement with sedation delivered overnight.  04/14/21- patient improved significantly. He is able to speak slowly now. Blood work with imrpoved renal indices post HD.  He continues to require PRN ativan per CIWA for EtOH withrdawal. Optimizing for TRH transfer today.   04/15/21- patient is stable, he is speaking whispering, states he knows [place] in [place] but that's all, still confused under anxiolytics for CIWA.  His renal function appears to have improved to baseline now.  PCCM will  sign off at this time and available if needed.     04/17/21- patient is improved, he does not require dialysis at this time. He has been with signs of etoh withdrawal and has restarted nourishement with continued electrolyte derrangements possibly due to refeeding syndrome. He is slow to answer but is not combative and seems comfortable despite some tremor and diaphoresis presumably from ongoing withdrawal symptoms.   04/18/21- Patient remains encepalopathic, he is in withdrawal had visual and auditory hallucinations this am requiring frequent re-orientation by sitter, he was aggitated ripped out his peripheral ivs and required hand mitts.  He fell out of bed prior to mitt placement. He is being followed by psychiatry and TRH service. He is oxygenating OK at this moment but remains high risk for aspiration and intubation due to inability to protect airway.   Lines/tubes :   Microbiology/Sepsis markers: Results for orders placed or performed during the hospital encounter of 04/12/21  Urine culture     Status: Abnormal   Collection Time: 04/12/21  3:45 PM   Specimen: In/Out Cath Urine  Result Value Ref Range Status   Specimen Description   Final    IN/OUT CATH URINE Performed at [facility], [address] [place], [place] [address]    Special Requests   Final    NONE Performed at [facility], [address] [place], [place] [address]    Culture MULTIPLE SPECIES PRESENT, SUGGEST RECOLLECTION (A)  Final   Report Status 04/14/2021 FINAL  Final  Resp Panel by RT-PCR (Flu A&B, Covid) Nasopharyngeal Swab     Status: None   Collection Time: 04/12/21  4:12 PM   Specimen: Nasopharyngeal Swab; Nasopharyngeal(NP) swabs in vial transport medium  Result Value Ref  Range Status   SARS Coronavirus 2 by RT PCR NEGATIVE NEGATIVE Final    Comment: (NOTE) SARS-CoV-2 target nucleic acids are NOT DETECTED.  The SARS-CoV-2 RNA is generally detectable in upper respiratory specimens  during the acute phase of infection. The lowest concentration of SARS-CoV-2 viral copies this assay can detect is 138 copies/mL. A negative result does not preclude SARS-Cov-2 infection and should not be used as the sole basis for treatment or other patient management decisions. A negative result may occur with  improper specimen collection/handling, submission of specimen other than nasopharyngeal swab, presence of viral mutation(s) within the areas targeted by this assay, and inadequate number of viral copies(<138 copies/mL). A negative result must be combined with clinical observations, patient history, and epidemiological information. The expected result is Negative.  Fact Sheet for Patients:  BloggerCourse.com  Fact Sheet for Healthcare Providers:  SeriousBroker.it  This test is no t yet approved or cleared by the [place] FDA and  has been authorized for detection and/or diagnosis of SARS-CoV-2 by FDA under an Emergency Use Authorization (EUA). This EUA will remain  in effect (meaning this test can be used) for the duration of the COVID-19 declaration under Section 564(b)(1) of the Act, 21 U.S.C.section 360bbb-3(b)(1), unless the authorization is terminated  or revoked sooner.       Influenza A by PCR NEGATIVE NEGATIVE Final   Influenza B by PCR NEGATIVE NEGATIVE Final    Comment: (NOTE) The Xpert Xpress SARS-CoV-2/FLU/RSV plus assay is intended as an aid in the diagnosis of influenza from Nasopharyngeal swab specimens and should not be used as a sole basis for treatment. Nasal washings and aspirates are unacceptable for Xpert Xpress SARS-CoV-2/FLU/RSV testing.  Fact Sheet for Patients: BloggerCourse.com  Fact Sheet for Healthcare Providers: SeriousBroker.it  This test is not yet approved or cleared by the [place] FDA and has been authorized for detection  and/or diagnosis of SARS-CoV-2 by FDA under an Emergency Use Authorization (EUA). This EUA will remain in effect (meaning this test can be used) for the duration of the COVID-19 declaration under Section 564(b)(1) of the Act, 21 U.S.C. section 360bbb-3(b)(1), unless the authorization is terminated or revoked.  Performed at [facility], [address] [place], [place] [address]   Blood Culture (routine x 2)     Status: None   Collection Time: 04/12/21  4:12 PM   Specimen: BLOOD  Result Value Ref Range Status   Specimen Description BLOOD BLOOD RIGHT WRIST  Final   Special Requests   Final    BOTTLES DRAWN AEROBIC AND ANAEROBIC Blood Culture adequate volume   Culture   Final    NO GROWTH 5 DAYS Performed at [facility], [address] [place], [place] [address]    Report Status 04/17/2021 FINAL  Final  Blood Culture (routine x 2)     Status: None   Collection Time: 04/12/21  4:17 PM   Specimen: BLOOD  Result Value Ref Range Status   Specimen Description BLOOD LEFT ANTECUBITAL  Final   Special Requests   Final    BOTTLES DRAWN AEROBIC AND ANAEROBIC Blood Culture adequate volume   Culture   Final    NO GROWTH 5 DAYS Performed at [facility], [address], [place], [place] [address]    Report Status 04/17/2021 FINAL  Final    Anti-infectives:  Anti-infectives (From admission, onward)   Start     Dose/Rate Route Frequency Ordered Stop   04/12/21 1615  cefTRIAXone (ROCEPHIN)  2 g in sodium chloride 0.9 % 100 mL IVPB  Status:  Discontinued        2 g 200 mL/hr over 30 Minutes Intravenous Every 24 hours 04/12/21 1611 04/14/21 1255   04/12/21 1615  azithromycin (ZITHROMAX) 500 mg in sodium chloride 0.9 % 250 mL IVPB  Status:  Discontinued        500 mg 250 mL/hr over 60 Minutes Intravenous Every 24 hours 04/12/21 1611 04/14/21 1255        PAST MEDICAL HISTORY   Past Medical History:  Diagnosis Date  . Diabetes mellitus without  complication (HCC)   . Glaucoma   . Hyperlipidemia   . Hypertension      SURGICAL HISTORY   Past Surgical History:  Procedure Laterality Date  . REPLACEMENT TOTAL KNEE BILATERAL Bilateral   . Skin grafts    . TOTAL SHOULDER REPLACEMENT Right      FAMILY HISTORY   Family History  Problem Relation Age of Onset  . Alzheimer's disease Father      SOCIAL HISTORY   Social History   Tobacco Use  . Smoking status: Never Smoker  . Smokeless tobacco: Never Used  Substance Use Topics  . Alcohol use: Yes    Alcohol/week: 49.0 standard drinks    Types: 49 Cans of beer per week  . Drug use: No     MEDICATIONS   Current Medication:  Current Facility-Administered Medications:  .  0.9 %  sodium chloride infusion, 250 mL, Intravenous, Continuous, Rust-Chester, Cecelia Byars, NP, Stopped at 04/14/21 1611 .  cloNIDine (CATAPRES - Dosed in mg/24 hr) patch 0.1 mg, 0.1 mg, Transdermal, Weekly, Hazeline Junker B, MD .  docusate sodium (COLACE) capsule 100 mg, 100 mg, Oral, BID PRN, Vida Rigger, MD, 100 mg at 04/16/21 2051 .  dorzolamide-timolol (COSOPT) 22.3-6.8 MG/ML ophthalmic solution 1 drop, 1 drop, Both Eyes, BID, Tyrone Nine, MD, 1 drop at 04/18/21 [phone] .  folic acid 1 mg in sodium chloride 0.9 % 50 mL IVPB, 1 mg, Intravenous, Daily, Stopped at 04/16/21 1900 **OR** folic acid (FOLVITE) tablet 1 mg, 1 mg, Oral, Daily, Hazeline Junker B, MD, 1 mg at 04/18/21 0827 .  haloperidol lactate (HALDOL) injection 1 mg, 1 mg, Intravenous, Q6H PRN, Clapacs, John T, MD .  insulin aspart (novoLOG) injection 0-9 Units, 0-9 Units, Subcutaneous, Q4H, Tyrone Nine, MD, 1 Units at 04/18/21 [phone] .  labetalol (NORMODYNE) injection 20 mg, 20 mg, Intravenous, Q3H PRN, Mansy, Jan A, MD, 20 mg at 04/16/21 1622 .  loperamide (IMODIUM) capsule 2 mg, 2 mg, Oral, PRN, Marrion Coy, MD, 2 mg at 04/18/21 1114 .  LORazepam (ATIVAN) tablet 1-4 mg, 1-4 mg, Oral, Q1H PRN **OR** LORazepam (ATIVAN) injection 1-4 mg, 1-4  mg, Intravenous, Q1H PRN, Tyrone Nine, MD, 2 mg at 04/18/21 0209 .  morphine 2 MG/ML injection 2 mg, 2 mg, Intravenous, Q4H PRN, Mansy, Jan A, MD, 2 mg at 04/18/21 0843 .  multivitamin with minerals tablet 1 tablet, 1 tablet, Oral, Daily, Rust-Chester, Britton L, NP, 1 tablet at 04/18/21 0827 .  nicotine (NICODERM CQ - dosed in mg/24 hours) patch 14 mg, 14 mg, Transdermal, Daily, Lecretia Buczek, MD, 14 mg at 04/18/21 0829 .  nystatin (MYCOSTATIN) 100000 UNIT/ML suspension 500,000 Units, 5 mL, Oral, QID, Marrion Coy, MD, 500,000 Units at 04/18/21 0827 .  polyethylene glycol (MIRALAX / GLYCOLAX) packet 17 g, 17 g, Oral, Daily PRN, Karna Christmas, Khristy Kalan, MD .  prednisoLONE acetate (PRED FORTE) 1 %  ophthalmic suspension 1 drop, 1 drop, Right Eye, TID, Marrion Coy, MD, 1 drop at 04/18/21 0829 .  thiamine  in normal saline (50ml) IVPB, 500 mg, Intravenous, Q8H, Marrion Coy, MD, Last Rate: 100 mL/hr at 04/18/21 0612, 500 mg at 04/18/21 0981    ALLERGIES   Patient has no known allergies.    REVIEW OF SYSTEMS     10 point ROS conducted exam except for hallucinations both auditory and visual.  PHYSICAL EXAMINATION   Vital Signs: Temp:  [97.7 F (36.5 C)-100 F (37.8 C)] 98.2 F (36.8 C) (05/05 1206) Pulse Rate:  [82-90] 82 (05/05 1206) Resp:  [14-20] 16 (05/05 1206) BP: (111-147)/(71-91) 147/89 (05/05 1206) SpO2:  [96 %-100 %] 100 % (05/05 1206)  GENERAL: Older than stated age, no apparent distress.  Becomes intermittently sedated post CIWA meds HEAD: Normocephalic, atraumatic.  EYES: Pupils equal, round, reactive to light.  No scleral icterus.  Positive conjunctivitis  MOUTH: Moist mucosal membrane. NECK: Supple. No thyromegaly. No nodules. No JVD.  PULMONARY: Clear to auscultation with mild rhonchorous breath sounds bilaterally CARDIOVASCULAR: S1 and S2. Regular rate and rhythm. No murmurs, rubs, or gallops.  GASTROINTESTINAL: Soft, nontender, non-distended. No masses. Positive  bowel sounds. No hepatosplenomegaly.  MUSCULOSKELETAL: No swelling, clubbing, or edema.  NEUROLOGIC: Altered sensorium with no focal deficit SKIN:intact,warm,dry   PERTINENT DATA     Infusions: . sodium chloride Stopped (04/14/21 1611)  . folic acid (FOLVITE) IVPB Stopped (04/16/21 1900)  . thiamine injection 500 mg (04/18/21 0612)   Scheduled Medications: . cloNIDine  0.1 mg Transdermal Weekly  . dorzolamide-timolol  1 drop Both Eyes BID  . folic acid  1 mg Oral Daily  . insulin aspart  0-9 Units Subcutaneous Q4H  . multivitamin with minerals  1 tablet Oral Daily  . nicotine  14 mg Transdermal Daily  . nystatin  5 mL Oral QID  . prednisoLONE acetate  1 drop Right Eye TID   PRN Medications: docusate sodium, haloperidol lactate, labetalol, loperamide, LORazepam **OR** LORazepam, morphine injection, polyethylene glycol Hemodynamic parameters:   Intake/Output: 05/04 0701 - 05/05 0700 In: -  Out: 1600 [Urine:1600]  Ventilator  Settings:    LAB RESULTS:  Basic Metabolic Panel: Recent Labs  Lab 04/13/21 0457 04/14/21 0313 04/15/21 0758 04/15/21 1614 04/16/21 0406 04/16/21 1058 04/16/21 1717 04/16/21 2237 04/17/21 0455 04/18/21 0535  NA 138 147* 155*  157*   < > 152* 150* 150* 143 143 142  K 3.3* 4.2 4.3  4.3   < > 3.4* 3.4* 3.4* 3.3* 3.2* 3.7  CL 97* 108 114*  115*   < > 113* 111 111 105 104 108  CO2 < > GLUCOSE 186* 138* 175*  180*   < > 225* 220* 206* 237* 196* 145*  BUN 117* 75* 52*  53*   < > 36* 30* 27* 26* 22* 16  CREATININE 6.02* 1.50* 1.04  1.04   < > 0.85 0.86 0.81 0.73 0.74 0.72  CALCIUM 8.0* 8.5* 9.3  9.5   < > 9.2 9.1 9.3 8.8* 8.9 8.8*  MG 2.1 2.0 2.1  --  2.0  --   --   --  1.7 1.6*  PHOS 7.2* 3.7 1.8*  --   --   --   --   --  2.0* 2.6   < > = values in this interval not displayed.   Liver Function Tests: Recent Labs  Lab 04/12/21 1559 04/12/21 2228 04/13/21 0457 04/15/21 0758  AST 10* 11* 13*  --    ALT 17 16 16   --   ALKPHOS 77 59 54  --   BILITOT 1.5* 1.2 1.4*  --   PROT 7.5 6.2* 5.6*  --   ALBUMIN 3.8 3.1* 2.8* 3.8   No results for input(s): LIPASE, AMYLASE in the last 168 hours. No results for input(s): AMMONIA in the last 168 hours. CBC: Recent Labs  Lab 04/12/21 1559 04/13/21 0457 04/14/21 0313 04/15/21 0758 04/18/21 0535  WBC 11.7* 6.1 4.9 4.6 8.8  NEUTROABS 10.7*  --   --   --  6.0  HGB 15.1 12.3* 13.4 14.8 15.1  HCT 42.0 33.9* 37.7* 44.1 41.9  MCV 89.2 88.5 90.8 93.8 88.8  PLT 207 126* 123* 126* 99*   Cardiac Enzymes: Recent Labs  Lab 04/12/21 1745  CKTOTAL 588*   BNP: Invalid input(s): POCBNP CBG: Recent Labs  Lab 04/17/21 2059 04/18/21 0044 04/18/21 0337 04/18/21 0754 04/18/21 1209  GLUCAP 173* 188* 133* 148* 194*       IMAGING RESULTS:  Imaging: No results found. @PROBHOSP @ No results found.        ASSESSMENT AND PLAN    -Multidisciplinary rounds held today   Renal Failure-AKI stage V-              -resolved  - 04/14/21- Creat significantly imrpoved and UOP has increased -s/p emergent dialysis -follow UO -continue Foley Catheter-assess need daily -Nephrology team on case-will leave riple-lumen dialysis catheter until Renal team asks to remove  Altered mental status with confusion-improved -Likely due to toxic metabolic encephalopathy secondary to severe AKI -with Alcoholic encephalopathy and withdrawal and concomitant effects of benzo via CIWA   High anion gap metabolic acidosis   -Patient is not with hyperglycemia, suspect metabolic derangements and lactic acidosis   -Currently receiving sodium bicarb infusion and planning for emergent dialysis   Severe hyperkalemia -Repeat twelve-lead EKG -peaked T waves and PR prolongation noted - deliver 1amp calcium gloconate -emergent HD -Lokelma ordered  -Renal team on case appreciate input -pharmacy consultation   Chronic alcoholism - CIWA protocol -monitor for alcohol  withdrawal syndrome -Ativan PRN ordered   Polysubstance abuse -Cocaine & tobacco history  -monitor for withdrawal -urine drug screen ordered -nicotine patch 14mg    Hx of right lower extermity osteosarcoma  - s/p AKA at age 63  -fall precautions while with altered sensorium  - may need 1:1 sitter  ID -continue IV abx as prescibed -follow up cultures  GI/Nutrition GI PROPHYLAXIS as indicated DIET-->TF's as tolerated Constipation protocol as indicated  ENDO - ICU hypoglycemic\Hyperglycemia protocol -check FSBS per protocol    DVT/GI PRX ordered -SCDs  TRANSFUSIONS AS NEEDED MONITOR FSBS ASSESS the need for LABS as needed      06/14/21, M.D.  Division of Pulmonary & Critical Care Medicine  [facility]

## 2021-04-19 LAB — BASIC METABOLIC PANEL
Anion gap: 5 (ref 5–15)
BUN: 15 mg/dL (ref 6–20)
CO2: 26 mmol/L (ref 22–32)
Calcium: 8.6 mg/dL — ABNORMAL LOW (ref 8.9–10.3)
Chloride: 111 mmol/L (ref 98–111)
Creatinine, Ser: 0.6 mg/dL — ABNORMAL LOW (ref 0.61–1.24)
GFR, Estimated: >60 mL/min (ref >60)
Glucose, Bld: 115 mg/dL — ABNORMAL HIGH (ref 70–99)
Potassium: 4.2 mmol/L (ref 3.5–5.1)
Sodium: 142 mmol/L (ref 135–145)

## 2021-04-19 LAB — C4 COMPLEMENT

## 2021-04-19 LAB — CBC WITH DIFFERENTIAL/PLATELET
Abs Immature Granulocytes: 0.03 10*3/uL (ref 0.00–0.07)
Basophils Absolute: 0 10*3/uL (ref 0.0–0.1)
Basophils Relative: 0 %
Eosinophils Absolute: 0.2 10*3/uL (ref 0.0–0.5)
Eosinophils Relative: 2 %
HCT: 40.4 % (ref 39.0–52.0)
Hemoglobin: 14.3 g/dL (ref 13.0–17.0)
Immature Granulocytes: 0 %
Lymphocytes Relative: 22 %
Lymphs Abs: 2.1 10*3/uL (ref 0.7–4.0)
MCH: 31.8 pg (ref 26.0–34.0)
MCHC: 35.4 g/dL (ref 30.0–36.0)
MCV: 89.8 fL (ref 80.0–100.0)
Monocytes Absolute: 0.4 10*3/uL (ref 0.1–1.0)
Monocytes Relative: 4 %
Neutro Abs: 7.1 10*3/uL (ref 1.7–7.7)
Neutrophils Relative %: 72 %
Platelets: 102 10*3/uL — ABNORMAL LOW (ref 150–400)
RBC: 4.5 MIL/uL (ref 4.22–5.81)
RDW: 12.2 % (ref 11.5–15.5)
WBC: 9.8 10*3/uL (ref 4.0–10.5)
nRBC: 0 % (ref 0.0–0.2)

## 2021-04-19 LAB — GLUCOSE, CAPILLARY
Glucose-Capillary: 132 mg/dL — ABNORMAL HIGH (ref 70–99)
Glucose-Capillary: 111 mg/dL — ABNORMAL HIGH (ref 70–99)
Glucose-Capillary: 204 mg/dL — ABNORMAL HIGH (ref 70–99)
Glucose-Capillary: 147 mg/dL — ABNORMAL HIGH (ref 70–99)
Glucose-Capillary: 203 mg/dL — ABNORMAL HIGH (ref 70–99)

## 2021-04-19 LAB — MAGNESIUM: Magnesium: 2.1 mg/dL (ref 1.7–2.4)

## 2021-04-19 LAB — HEPARIN INDUCED PLATELET AB (HIT ANTIBODY): Heparin Induced Plt Ab: 0.226 OD (ref 0.000–0.400)

## 2021-04-19 LAB — PHOSPHORUS: Phosphorus: 2.7 mg/dL (ref 2.5–4.6)

## 2021-04-19 LAB — C3 COMPLEMENT

## 2021-04-19 MED ORDER — LIDOCAINE 5 % EX PTCH
2.0000 | MEDICATED_PATCH | CUTANEOUS | Status: DC
  Administered 2021-04-19: 2 via TRANSDERMAL
  Filled 2021-04-19 (×2): qty 2

## 2021-04-19 MED ORDER — THIAMINE HCL 100 MG/ML IJ SOLN
250.0000 mg | INTRAVENOUS | Status: DC
  Administered 2021-04-19 – 2021-04-20 (×2): 250 mg via INTRAVENOUS
  Filled 2021-04-19 (×2): qty 2.5

## 2021-04-19 MED ORDER — OXYCODONE-ACETAMINOPHEN 5-325 MG PO TABS
1.0000 | ORAL_TABLET | ORAL | Status: DC | PRN
Start: 2021-04-19 — End: 2021-04-20
  Administered 2021-04-19 – 2021-04-20 (×3): 1 via ORAL
  Filled 2021-04-19 (×3): qty 1

## 2021-04-19 NOTE — Progress Notes (Signed)
PROGRESS NOTE    Isaac Morrison  ZOX:[id number] DOB: 1961/07/19 DOA: 04/12/2021 PCP: Isaac Morrison   Chief Complaint.  Altered mental status. Brief Narrative:  Isaac Morrison a59 y.o.malewith a history of childhood osteosarcoma s/p AKA, lifelong tobacco use, alcoholism, T2DM, HLD, HTN who presented to the ED 4/29 with 1 week of worsening delirium found to have severe renal failure with hyperkalemia and acidosis. He was admitted to the ICU, underwent HD with improvement in metabolic parameters. Mentation has remained severely altered and ativan has been given for alcohol withdrawal. He was transferred to the medical floor 5/1 on hospitalist service as of 5/2. Sodium has trended up to 157and improving with D5W. Nephrology is on board. 5/4.Renal function had improved, however, patient still has significant encephalopathy, concerning for Wernicke's encephalopathy, start high-dose of thiamine.   Assessment & Plan:   Principal Problem:   Alcohol withdrawal delirium (HCC) Active Problems:   Acute renal failure (HCC)   Hypernatremia   Wernicke encephalopathy  #1. Acute renal failure secondary to dehydration. Hypernatremia secondary dehydration. Initial hyperkalemia, now hypokalemia. High anion gap metabolic acidosis. Hypophosphatemia. Hypomagnesemia. Conditions had improved.  #2. Alcohol abuse with alcohol withdrawal. Warnicke encephalopathy. Thrush. Patient condition is much better today.  No additional visual hallucination.  He still has significant weakness from physical therapist, pending nursing home placement.  #3.  Essential hypertension, hypertension emergency. Condition stable.  4.  Thrombocytopenia. Platelet count better, unlikely to be heparin-induced.  5.  Uncontrolled type 2 diabetes with hyperglycemia Continue current regimen.  DVT prophylaxis: SCDs Code Status: Full Family Communication:  Disposition Plan:  .   Status is: Inpatient  Remains  inpatient appropriate because:Inpatient level of care appropriate due to severity of illness   Dispo: The patient is from: Home              Anticipated d/c is to: SNF              Patient currently is not medically stable to d/c.   Difficult to place patient No        I/O last 3 completed shifts: In: 100 [IV Piggyback:100] Out: 1300 [Urine:1300] Total I/O In: -  Out: 300 [Urine:300]     Consultants:   None  Procedures: Full  Antimicrobials: None  Subjective: Patient doing better today, no additional visual hallucination.  No confusion today.  He slept well last night.  He still has unsteady gait, able to walk with physical therapy. Denies any short of breath or cough. No abdominal pain or nausea vomiting.  Diarrhea has resolved today. No fever or chills.  Objective: Vitals:   04/19/21 0000 04/19/21 0411 04/19/21 0756 04/19/21 1157  BP: 107/60 124/74 (!) 141/76 119/72  Pulse: 75 63 86 81  Resp: 16 16 14    Temp: 97.6 F (36.4 C) 98.4 F (36.9 C) 98.2 F (36.8 C) 98.8 F (37.1 C)  TempSrc: Oral Oral  Oral  SpO2: 99% 100% 100% 96%  Weight:      Height:        Intake/Output Summary (Last 24 hours) at 04/19/2021 1246 Last data filed at 04/19/2021 1007 Gross per 24 hour  Intake 100 ml  Output 1000 ml  Net -900 ml   Filed Weights   04/13/21 0504 04/14/21 0500 04/15/21 0500  Weight: 69.5 kg 64.1 kg 64.3 kg    Examination:  General exam: Appears calm and comfortable  Respiratory system: Clear to auscultation. Respiratory effort normal. Cardiovascular system: S1 & S2 heard, RRR. No JVD,  murmurs, rubs, gallops or clicks. No pedal edema. Gastrointestinal system: Abdomen is nondistended, soft and nontender. No organomegaly or masses felt. Normal bowel sounds heard. Central nervous system: Alert and oriented. No focal neurological deficits. Extremities: Symmetric 5 x 5 power. Skin: No rashes, lesions or ulcers Psychiatry: Judgement and insight appear normal.  Mood & affect appropriate.     Data Reviewed: I have personally reviewed following labs and imaging studies  CBC: Recent Labs  Lab 04/12/21 1559 04/13/21 0457 04/14/21 0313 04/15/21 0758 04/18/21 0535 04/19/21 0442  WBC 11.7* 6.1 4.9 4.6 8.8 9.8  NEUTROABS 10.7*  --   --   --  6.0 7.1  HGB 15.1 12.3* 13.4 14.8 15.1 14.3  HCT 42.0 33.9* 37.7* 44.1 41.9 40.4  MCV 89.2 88.5 90.8 93.8 88.8 89.8  PLT 207 126* 123* 126* 99* 102*   Basic Metabolic Panel: Recent Labs  Lab 04/14/21 0313 04/15/21 0758 04/15/21 1614 04/16/21 0406 04/16/21 1058 04/16/21 1717 04/16/21 2237 04/17/21 0455 04/18/21 0535 04/19/21 0442  NA 147* 155*  157*   < > 152*   < > 150* 143 143 142 142  K 4.2 4.3  4.3   < > 3.4*   < > 3.4* 3.3* 3.2* 3.7 4.2  CL 108 114*  115*   < > 113*   < > 111 105 104 108 111  CO2 28 26  28    < > 29   < > 30 29 29 26 26   GLUCOSE 138* 175*  180*   < > 225*   < > 206* 237* 196* 145* 115*  BUN 75* 52*  53*   < > 36*   < > 27* 26* 22* 16 15  CREATININE 1.50* 1.04  1.04   < > 0.85   < > 0.81 0.73 0.74 0.72 0.60*  CALCIUM 8.5* 9.3  9.5   < > 9.2   < > 9.3 8.8* 8.9 8.8* 8.6*  MG 2.0 2.1  --  2.0  --   --   --  1.7 1.6* 2.1  PHOS 3.7 1.8*  --   --   --   --   --  2.0* 2.6 2.7   < > = values in this interval not displayed.   GFR: Estimated Creatinine Clearance: 91.5 mL/min (A) (by C-G formula based on SCr of 0.6 mg/dL (L)). Liver Function Tests: Recent Labs  Lab 04/12/21 1559 04/12/21 2228 04/13/21 0457 04/15/21 0758  AST 10* 11* 13*  --   ALT 17 16 16   --   ALKPHOS 77 59 54  --   BILITOT 1.5* 1.2 1.4*  --   PROT 7.5 6.2* 5.6*  --   ALBUMIN 3.8 3.1* 2.8* 3.8   No results for input(s): LIPASE, AMYLASE in the last 168 hours. No results for input(s): AMMONIA in the last 168 hours. Coagulation Profile: Recent Labs  Lab 04/12/21 1559  INR 1.2   Cardiac Enzymes: Recent Labs  Lab 04/12/21 1745  CKTOTAL 588*   BNP (last 3 results) No results for input(s):  PROBNP in the last 8760 hours. HbA1C: Recent Labs    04/17/21 0455  HGBA1C 6.9*   CBG: Recent Labs  Lab 04/18/21 2109 04/18/21 2358 04/19/21 0405 04/19/21 0732 04/19/21 1201  GLUCAP 218* 200* 132* 111* 204*   Lipid Profile: No results for input(s): CHOL, HDL, LDLCALC, TRIG, CHOLHDL, LDLDIRECT in the last 72 hours. Thyroid Function Tests: No results for input(s): TSH, T4TOTAL, FREET4, T3FREE, THYROIDAB in the last  72 hours. Anemia Panel: No results for input(s): VITAMINB12, FOLATE, FERRITIN, TIBC, IRON, RETICCTPCT in the last 72 hours. Sepsis Labs: Recent Labs  Lab 04/12/21 1559  LATICACIDVEN 1.4    Recent Results (from the past 240 hour(s))  Urine culture     Status: Abnormal   Collection Time: 04/12/21  3:45 PM   Specimen: In/Out Cath Urine  Result Value Ref Range Status   Specimen Description   Final    IN/OUT CATH URINE Performed at [facility], [address], [place], [place] [address]    Special Requests   Final    NONE Performed at [facility], [address] [place], [place] [address]    Culture MULTIPLE SPECIES PRESENT, SUGGEST RECOLLECTION (A)  Final   Report Status 04/14/2021 FINAL  Final  Resp Panel by RT-PCR (Flu A&B, Covid) Nasopharyngeal Swab     Status: None   Collection Time: 04/12/21  4:12 PM   Specimen: Nasopharyngeal Swab; Nasopharyngeal(NP) swabs in vial transport medium  Result Value Ref Range Status   SARS Coronavirus 2 by RT PCR NEGATIVE NEGATIVE Final    Comment: (NOTE) SARS-CoV-2 target nucleic acids are NOT DETECTED.  The SARS-CoV-2 RNA is generally detectable in upper respiratory specimens during the acute phase of infection. The lowest concentration of SARS-CoV-2 viral copies this assay can detect is 138 copies/mL. A negative result does not preclude SARS-Cov-2 infection and should not be used as the sole basis for treatment or other patient management decisions. A negative result may occur with  improper  specimen collection/handling, submission of specimen other than nasopharyngeal swab, presence of viral mutation(s) within the areas targeted by this assay, and inadequate number of viral copies(<138 copies/mL). A negative result must be combined with clinical observations, patient history, and epidemiological information. The expected result is Negative.  Fact Sheet for Patients:  BloggerCourse.com  Fact Sheet for Healthcare Providers:  SeriousBroker.it  This test is no t yet approved or cleared by the [place] FDA and  has been authorized for detection and/or diagnosis of SARS-CoV-2 by FDA under an Emergency Use Authorization (EUA). This EUA will remain  in effect (meaning this test can be used) for the duration of the COVID-19 declaration under Section 564(b)(1) of the Act, 21 U.S.C.section 360bbb-3(b)(1), unless the authorization is terminated  or revoked sooner.       Influenza A by PCR NEGATIVE NEGATIVE Final   Influenza B by PCR NEGATIVE NEGATIVE Final    Comment: (NOTE) The Xpert Xpress SARS-CoV-2/FLU/RSV plus assay is intended as an aid in the diagnosis of influenza from Nasopharyngeal swab specimens and should not be used as a sole basis for treatment. Nasal washings and aspirates are unacceptable for Xpert Xpress SARS-CoV-2/FLU/RSV testing.  Fact Sheet for Patients: BloggerCourse.com  Fact Sheet for Healthcare Providers: SeriousBroker.it  This test is not yet approved or cleared by the [place] FDA and has been authorized for detection and/or diagnosis of SARS-CoV-2 by FDA under an Emergency Use Authorization (EUA). This EUA will remain in effect (meaning this test can be used) for the duration of the COVID-19 declaration under Section 564(b)(1) of the Act, 21 U.S.C. section 360bbb-3(b)(1), unless the authorization is terminated or revoked.  Performed at  [facility], [address] [place], [place] [address]   Blood Culture (routine x 2)     Status: None   Collection Time: 04/12/21  4:12 PM   Specimen: BLOOD  Result Value Ref Range Status   Specimen Description BLOOD BLOOD  RIGHT WRIST  Final   Special Requests   Final    BOTTLES DRAWN AEROBIC AND ANAEROBIC Blood Culture adequate volume   Culture   Final    NO GROWTH 5 DAYS Performed at [facility], [address] [place], [place] [address]    Report Status 04/17/2021 FINAL  Final  Blood Culture (routine x 2)     Status: None   Collection Time: 04/12/21  4:17 PM   Specimen: BLOOD  Result Value Ref Range Status   Specimen Description BLOOD LEFT ANTECUBITAL  Final   Special Requests   Final    BOTTLES DRAWN AEROBIC AND ANAEROBIC Blood Culture adequate volume   Culture   Final    NO GROWTH 5 DAYS Performed at [facility], [address] [place], [place] [address]    Report Status 04/17/2021 FINAL  Final         Radiology Studies: No results found.      Scheduled Meds: . cloNIDine  0.1 mg Transdermal Weekly  . dorzolamide-timolol  1 drop Both Eyes BID  . folic acid  1 mg Oral Daily  . insulin aspart  0-9 Units Subcutaneous Q4H  . lidocaine  2 patch Transdermal Q24H  . multivitamin with minerals  1 tablet Oral Daily  . nicotine  14 mg Transdermal Daily  . nystatin  5 mL Oral QID  . prednisoLONE acetate  1 drop Right Eye TID   Continuous Infusions: . sodium chloride Stopped (04/14/21 1611)  . folic acid (FOLVITE) IVPB Stopped (04/16/21 1900)  . thiamine injection 250 mg (04/19/21 1007)     LOS: 7 days    Time spent: 28 minutes    Marrion Coy, [facility]   To contact the attending provider between 7A-7P or the covering provider during after hours 7P-7A, please log into the web site [email or web address] and access using universal [place] password for that web site. If you do not have the password, please call the  hospital operator.  04/19/2021, 12:46 PM

## 2021-04-19 NOTE — Plan of Care (Signed)
End of Shift Summary:   Patient slept comfortably throughout the night. PRN Ativan was not needed per CIWA scale. Safety Sitter remains in room. Lidocaine Patch ordered for back pain. Vital signs stable. No acute medical complaints at this time.

## 2021-04-19 NOTE — TOC Progression Note (Signed)
Transition of Care [facility]) - Progression Note    Patient Details  Name: Josmar Messimer MRN: [id number] Date of Birth: 03-19-1961  [facility]) CM/SW Contact  Caryn Section, RN Phone Number: 04/19/2021, 4:39 PM  Clinical Narrative:   TOC in to see patient re: discharge planning.  Patient plans to return home with brother, states he has no concerns.  Discussed recommendations of SNF with patient, patient refuses SNF, care team aware, awaiting response.  Patient declines further needs, TOC contact information given to patient.      Expected Discharge Plan: Home/Self Care Barriers to Discharge: Continued Medical Work up  Expected Discharge Plan and Services Expected Discharge Plan: Home/Self Care   Discharge Planning Services: CM Consult Post Acute Care Choice: NA Living arrangements for the past 2 months: Single Family Home                 DME Arranged:  (TBD)         HH Arranged:  (TBD)           Social Determinants of Health (SDOH) Interventions    Readmission Risk Interventions No flowsheet data found.

## 2021-04-19 NOTE — Progress Notes (Signed)
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  He has a history of chronic alcoholism, lifelong smoking history.  Brother of patient was present and was able to provide additional details including cocaine history.  Patient denies seizures in the past.  During interview patient is able to answer questions and is able to tell me that he is having visual and auditory hallucinations which are new for him and have lasted approximately 3 to 4 days.  Brother of patient states that he has been with decreased mentation and altered sensorium in the past 1 week.  Patient has severe AKI and is admitted to the medical intensive care unit for requirement of emergent dialysis.  Patient was seen by nephrology and plan for HD overnight.  04/13/21- patient had HD overnight with significant improvement in renal function. His mentation declined overnight and there is plan for CT head today. I suspect this is related to metabolic derrangement with sedation delivered overnight.  04/14/21- patient improved significantly. He is able to speak slowly now. Blood work with imrpoved renal indices post HD.  He continues to require PRN ativan per CIWA for EtOH withrdawal. Optimizing for TRH transfer today.   04/15/21- patient is stable, he is speaking whispering, states he knows he is in [place] but that's all, still confused under anxiolytics for CIWA.  His renal function appears to have improved to baseline now.  PCCM will  sign off at this time and available if needed.     04/17/21- patient is improved, he does not require dialysis at this time. He has been with signs of etoh withdrawal and has restarted nourishement with continued electrolyte derrangements possibly due to refeeding syndrome. He is slow to answer but is not combative and seems comfortable despite some tremor and diaphoresis presumably from ongoing withdrawal symptoms.   04/18/21- Patient remains encepalopathic, he is in withdrawal had visual and auditory hallucinations this am requiring frequent re-orientation by sitter, he was aggitated ripped out his peripheral ivs and required hand mitts.  He fell out of bed prior to mitt placement. He is being followed by psychiatry and TRH service. He is oxygenating OK at this moment but remains high risk for aspiration and intubation due to inability to protect airway.   04/19/21- patient is awake and speaking this am , he does not recall anything from yesterday.  Today he states he is ready for PT/OT wants to try to put on his prosthetic and walk around if possible. PCCM will sign off today and are available when needed.   Lines/tubes :   Microbiology/Sepsis markers: Results for orders placed or performed during the hospital encounter of 04/12/21  Urine culture     Status: Abnormal   Collection Time: 04/12/21  3:45 PM   Specimen: In/Out Cath Urine  Result Value Ref Range Status   Specimen Description   Final    IN/OUT CATH URINE Performed at [facility], [address], [place], [place] [address]    Special Requests   Final    NONE Performed at [facility], [address] [place], [place] [address]    Culture MULTIPLE SPECIES PRESENT, SUGGEST RECOLLECTION (  A)  Final   Report Status 04/14/2021 FINAL  Final  Resp Panel by RT-PCR (Flu A&B, Covid) Nasopharyngeal Swab     Status: None   Collection Time: 04/12/21  4:12 PM   Specimen: Nasopharyngeal Swab; Nasopharyngeal(NP) swabs in vial  transport medium  Result Value Ref Range Status   SARS Coronavirus 2 by RT PCR NEGATIVE NEGATIVE Final    Comment: (NOTE) SARS-CoV-2 target nucleic acids are NOT DETECTED.  The SARS-CoV-2 RNA is generally detectable in upper respiratory specimens during the acute phase of infection. The lowest concentration of SARS-CoV-2 viral copies this assay can detect is 138 copies/mL. A negative result does not preclude SARS-Cov-2 infection and should not be used as the sole basis for treatment or other patient management decisions. A negative result may occur with  improper specimen collection/handling, submission of specimen other than nasopharyngeal swab, presence of viral mutation(s) within the areas targeted by this assay, and inadequate number of viral copies(<138 copies/mL). A negative result must be combined with clinical observations, patient history, and epidemiological information. The expected result is Negative.  Fact Sheet for Patients:  BloggerCourse.com  Fact Sheet for Healthcare Providers:  SeriousBroker.it  This test is no t yet approved or cleared by the [place] FDA and  has been authorized for detection and/or diagnosis of SARS-CoV-2 by FDA under an Emergency Use Authorization (EUA). This EUA will remain  in effect (meaning this test can be used) for the duration of the COVID-19 declaration under Section 564(b)(1) of the Act, 21 U.S.C.section 360bbb-3(b)(1), unless the authorization is terminated  or revoked sooner.       Influenza A by PCR NEGATIVE NEGATIVE Final   Influenza B by PCR NEGATIVE NEGATIVE Final    Comment: (NOTE) The Xpert Xpress SARS-CoV-2/FLU/RSV plus assay is intended as an aid in the diagnosis of influenza from Nasopharyngeal swab specimens and should not be used as a sole basis for treatment. Nasal washings and aspirates are unacceptable for Xpert Xpress SARS-CoV-2/FLU/RSV testing.  Fact  Sheet for Patients: BloggerCourse.com  Fact Sheet for Healthcare Providers: SeriousBroker.it  This test is not yet approved or cleared by the [place] FDA and has been authorized for detection and/or diagnosis of SARS-CoV-2 by FDA under an Emergency Use Authorization (EUA). This EUA will remain in effect (meaning this test can be used) for the duration of the COVID-19 declaration under Section 564(b)(1) of the Act, 21 U.S.C. section 360bbb-3(b)(1), unless the authorization is terminated or revoked.  Performed at [facility], [address] [place], [place] [address]   Blood Culture (routine x 2)     Status: None   Collection Time: 04/12/21  4:12 PM   Specimen: BLOOD  Result Value Ref Range Status   Specimen Description BLOOD BLOOD RIGHT WRIST  Final   Special Requests   Final    BOTTLES DRAWN AEROBIC AND ANAEROBIC Blood Culture adequate volume   Culture   Final    NO GROWTH 5 DAYS Performed at [facility], [address], [place], [place] [address]    Report Status 04/17/2021 FINAL  Final  Blood Culture (routine x 2)     Status: None   Collection Time: 04/12/21  4:17 PM   Specimen: BLOOD  Result Value Ref Range Status   Specimen Description BLOOD LEFT ANTECUBITAL  Final   Special Requests   Final    BOTTLES DRAWN AEROBIC AND ANAEROBIC Blood Culture adequate volume   Culture   Final    NO GROWTH 5  DAYS Performed at [facility], [address], [place], [place] [address]    Report Status 04/17/2021 FINAL  Final    Anti-infectives:  Anti-infectives (From admission, onward)   Start     Dose/Rate Route Frequency Ordered Stop   04/12/21 1615  cefTRIAXone (ROCEPHIN) 2 g in sodium chloride 0.9 % 100 mL IVPB  Status:  Discontinued        2 g 200 mL/hr over 30 Minutes Intravenous Every 24 hours 04/12/21 1611 04/14/21 1255   04/12/21 1615  azithromycin (ZITHROMAX) 500 mg in sodium chloride  0.9 % 250 mL IVPB  Status:  Discontinued        500 mg 250 mL/hr over 60 Minutes Intravenous Every 24 hours 04/12/21 1611 04/14/21 1255        PAST MEDICAL HISTORY   Past Medical History:  Diagnosis Date  . Diabetes mellitus without complication (HCC)   . Glaucoma   . Hyperlipidemia   . Hypertension      SURGICAL HISTORY   Past Surgical History:  Procedure Laterality Date  . REPLACEMENT TOTAL KNEE BILATERAL Bilateral   . Skin grafts    . TOTAL SHOULDER REPLACEMENT Right      FAMILY HISTORY   Family History  Problem Relation Age of Onset  . Alzheimer's disease Father      SOCIAL HISTORY   Social History   Tobacco Use  . Smoking status: Never Smoker  . Smokeless tobacco: Never Used  Substance Use Topics  . Alcohol use: Yes    Alcohol/week: 49.0 standard drinks    Types: 49 Cans of beer per week  . Drug use: No     MEDICATIONS   Current Medication:  Current Facility-Administered Medications:  .  0.9 %  sodium chloride infusion, 250 mL, Intravenous, Continuous, Rust-Chester, Cecelia Byars, NP, Stopped at 04/14/21 1611 .  cloNIDine (CATAPRES - Dosed in mg/24 hr) patch 0.1 mg, 0.1 mg, Transdermal, Weekly, Hazeline Junker B, MD .  docusate sodium (COLACE) capsule 100 mg, 100 mg, Oral, BID PRN, Vida Rigger, MD, 100 mg at 04/16/21 2051 .  dorzolamide-timolol (COSOPT) 22.3-6.8 MG/ML ophthalmic solution 1 drop, 1 drop, Both Eyes, BID, Tyrone Nine, MD, 1 drop at 04/18/21 2148 .  folic acid 1 mg in sodium chloride 0.9 % 50 mL IVPB, 1 mg, Intravenous, Daily, Stopped at 04/16/21 1900 **OR** folic acid (FOLVITE) tablet 1 mg, 1 mg, Oral, Daily, Hazeline Junker B, MD, 1 mg at 04/18/21 0827 .  haloperidol lactate (HALDOL) injection 1 mg, 1 mg, Intravenous, Q6H PRN, Clapacs, John T, MD .  insulin aspart (novoLOG) injection 0-9 Units, 0-9 Units, Subcutaneous, Q4H, Hazeline Junker B, MD, 1 Units at 04/19/21 0419 .  labetalol (NORMODYNE) injection 20 mg, 20 mg, Intravenous, Q3H PRN,  Mansy, Jan A, MD, 20 mg at 04/16/21 1622 .  lidocaine (LIDODERM) 5 % 2 patch, 2 patch, Transdermal, Q24H, Manuela Schwartz, NP, 2 patch at 04/19/21 0121 .  loperamide (IMODIUM) capsule 2 mg, 2 mg, Oral, PRN, Marrion Coy, MD, 2 mg at 04/18/21 1114 .  LORazepam (ATIVAN) tablet 1-4 mg, 1-4 mg, Oral, Q1H PRN **OR** LORazepam (ATIVAN) injection 1-4 mg, 1-4 mg, Intravenous, Q1H PRN, Tyrone Nine, MD, 2 mg at 04/18/21 0209 .  morphine 2 MG/ML injection 2 mg, 2 mg, Intravenous, Q4H PRN, Mansy, Jan A, MD, 2 mg at 04/18/21 1624 .  multivitamin with minerals tablet 1 tablet, 1 tablet, Oral, Daily, Rust-Chester, Britton L, NP, 1 tablet at 04/18/21 0827 .  nicotine (NICODERM CQ - dosed in mg/24 hours) patch 14 mg, 14 mg, Transdermal, Daily, Lakeva Hollon, MD, 14 mg at 04/18/21 0829 .  nystatin (MYCOSTATIN) 100000 UNIT/ML suspension 500,000 Units, 5 mL, Oral, QID, Marrion Coy, MD, 500,000 Units at 04/18/21 2147 .  polyethylene glycol (MIRALAX / GLYCOLAX) packet 17 g, 17 g, Oral, Daily PRN, Karna Christmas, Jisell Majer, MD .  prednisoLONE acetate (PRED FORTE) 1 % ophthalmic suspension 1 drop, 1 drop, Right Eye, TID, Marrion Coy, MD, 1 drop at 04/18/21 2148    ALLERGIES   Patient has no known allergies.    REVIEW OF SYSTEMS     10 point ROS conducted exam except for hallucinations both auditory and visual.  PHYSICAL EXAMINATION   Vital Signs: Temp:  [97.6 F (36.4 C)-98.6 F (37 C)] 98.4 F (36.9 C) (05/06 0411) Pulse Rate:  [63-90] 63 (05/06 0411) Resp:  [14-17] 16 (05/06 0411) BP: (107-147)/(60-91) 124/74 (05/06 0411) SpO2:  [96 %-100 %] 100 % (05/06 0411)  GENERAL: Older than stated age, no apparent distress.  Becomes intermittently sedated post CIWA meds HEAD: Normocephalic, atraumatic.  EYES: Pupils equal, round, reactive to light.  No scleral icterus.  Positive conjunctivitis  MOUTH: Moist mucosal membrane. NECK: Supple. No thyromegaly. No nodules. No JVD.  PULMONARY: Clear to auscultation  with mild rhonchorous breath sounds bilaterally CARDIOVASCULAR: S1 and S2. Regular rate and rhythm. No murmurs, rubs, or gallops.  GASTROINTESTINAL: Soft, nontender, non-distended. No masses. Positive bowel sounds. No hepatosplenomegaly.  MUSCULOSKELETAL: No swelling, clubbing, or edema.  NEUROLOGIC: Altered sensorium with no focal deficit SKIN:intact,warm,dry   PERTINENT DATA     Infusions: . sodium chloride Stopped (04/14/21 1611)  . folic acid (FOLVITE) IVPB Stopped (04/16/21 1900)   Scheduled Medications: . cloNIDine  0.1 mg Transdermal Weekly  . dorzolamide-timolol  1 drop Both Eyes BID  . folic acid  1 mg Oral Daily  . insulin aspart  0-9 Units Subcutaneous Q4H  . lidocaine  2 patch Transdermal Q24H  . multivitamin with minerals  1 tablet Oral Daily  . nicotine  14 mg Transdermal Daily  . nystatin  5 mL Oral QID  . prednisoLONE acetate  1 drop Right Eye TID   PRN Medications: docusate sodium, haloperidol lactate, labetalol, loperamide, LORazepam **OR** LORazepam, morphine injection, polyethylene glycol Hemodynamic parameters:   Intake/Output: 05/05 0701 - 05/06 0700 In: 100 [IV Piggyback:100] Out: 1000 [Urine:1000]  Ventilator  Settings:    LAB RESULTS:  Basic Metabolic Panel: Recent Labs  Lab 04/14/21 0313 04/15/21 0758 04/15/21 1614 04/16/21 0406 04/16/21 1058 04/16/21 1717 04/16/21 2237 04/17/21 0455 04/18/21 0535 04/19/21 0442  NA 147* 155*  157*   < > 152*   < > 150* 143 143 142 142  K 4.2 4.3  4.3   < > 3.4*   < > 3.4* 3.3* 3.2* 3.7 4.2  CL 108 114*  115*   < > 113*   < > 111 105 104 108 111  CO2 28 26  28    < > 29   < > 30 29 29 26 26   GLUCOSE 138* 175*  180*   < > 225*   < > 206* 237* 196* 145* 115*  BUN 75* 52*  53*   < > 36*   < > 27* 26* 22* 16 15  CREATININE 1.50* 1.04  1.04   < > 0.85   < > 0.81 0.73 0.74 0.72 0.60*  CALCIUM 8.5* 9.3  9.5   < > 9.2   < >  9.3 8.8* 8.9 8.8* 8.6*  MG 2.0 2.1  --  2.0  --   --   --  1.7 1.6* 2.1   PHOS 3.7 1.8*  --   --   --   --   --  2.0* 2.6 2.7   < > = values in this interval not displayed.   Liver Function Tests: Recent Labs  Lab 04/12/21 1559 04/12/21 2228 04/13/21 0457 04/15/21 0758  AST 10* 11* 13*  --   ALT 17 16 16   --   ALKPHOS 77 59 54  --   BILITOT 1.5* 1.2 1.4*  --   PROT 7.5 6.2* 5.6*  --   ALBUMIN 3.8 3.1* 2.8* 3.8   No results for input(s): LIPASE, AMYLASE in the last 168 hours. No results for input(s): AMMONIA in the last 168 hours. CBC: Recent Labs  Lab 04/12/21 1559 04/13/21 0457 04/14/21 0313 04/15/21 0758 04/18/21 0535 04/19/21 0442  WBC 11.7* 6.1 4.9 4.6 8.8 9.8  NEUTROABS 10.7*  --   --   --  6.0 7.1  HGB 15.1 12.3* 13.4 14.8 15.1 14.3  HCT 42.0 33.9* 37.7* 44.1 41.9 40.4  MCV 89.2 88.5 90.8 93.8 88.8 89.8  PLT 207 126* 123* 126* 99* 102*   Cardiac Enzymes: Recent Labs  Lab 04/12/21 1745  CKTOTAL 588*   BNP: Invalid input(s): POCBNP CBG: Recent Labs  Lab 04/18/21 1536 04/18/21 2109 04/18/21 2358 04/19/21 0405 04/19/21 0732  GLUCAP 167* 218* 200* 132* 111*       IMAGING RESULTS:  Imaging: No results found. @PROBHOSP @ No results found.        ASSESSMENT AND PLAN    -Multidisciplinary rounds held today   Renal Failure-AKI stage V-              -resolved  - 04/14/21- Creat significantly imrpoved and UOP has increased -s/p emergent dialysis -follow UO -continue Foley Catheter-assess need daily -Nephrology team on case-will leave riple-lumen dialysis catheter until Renal team asks to remove  Altered mental status with confusion-improved -Likely due to toxic metabolic encephalopathy secondary to severe AKI -with Alcoholic encephalopathy and withdrawal and concomitant effects of benzo via CIWA   High anion gap metabolic acidosis   -Patient is not with hyperglycemia, suspect metabolic derangements and lactic acidosis   -Currently receiving sodium bicarb infusion and planning for emergent  dialysis   Severe hyperkalemia -Repeat twelve-lead EKG -peaked T waves and PR prolongation noted - deliver 1amp calcium gloconate -emergent HD -Lokelma ordered  -Renal team on case appreciate input -pharmacy consultation   Chronic alcoholism - CIWA protocol -monitor for alcohol withdrawal syndrome -Ativan PRN ordered   Polysubstance abuse -Cocaine & tobacco history  -monitor for withdrawal -urine drug screen ordered -nicotine patch 14mg    Hx of right lower extermity osteosarcoma  - s/p AKA at age 60  -fall precautions while with altered sensorium  - may need 1:1 sitter  ID -continue IV abx as prescibed -follow up cultures  GI/Nutrition GI PROPHYLAXIS as indicated DIET-->TF's as tolerated Constipation protocol as indicated  ENDO - ICU hypoglycemic\Hyperglycemia protocol -check FSBS per protocol    DVT/GI PRX ordered -SCDs  TRANSFUSIONS AS NEEDED MONITOR FSBS ASSESS the need for LABS as needed      Vida RiggerFuad Bali Lyn, M.D.  Division of Pulmonary & Critical Care Medicine  [facility]

## 2021-04-19 NOTE — Care Management Important Message (Signed)
Important Message  Patient Details  Name: Isaac Morrison MRN: [id number] Date of Birth: 04/16/1961   Medicare Important Message Given:  Yes     Olegario Messier A Tamirah George 04/19/2021, 12:11 PM

## 2021-04-20 DIAGNOSIS — F10231 Alcohol dependence with withdrawal delirium: Secondary | ICD-10-CM | POA: Diagnosis not present

## 2021-04-20 LAB — CBC WITH DIFFERENTIAL/PLATELET
Abs Immature Granulocytes: 0.02 10*3/uL (ref 0.00–0.07)
Basophils Absolute: 0 10*3/uL (ref 0.0–0.1)
Basophils Relative: 0 %
Eosinophils Absolute: 0.2 10*3/uL (ref 0.0–0.5)
Eosinophils Relative: 2 %
HCT: 39.1 % (ref 39.0–52.0)
Hemoglobin: 13.4 g/dL (ref 13.0–17.0)
Immature Granulocytes: 0 %
Lymphocytes Relative: 30 %
Lymphs Abs: 2.1 10*3/uL (ref 0.7–4.0)
MCH: 31.5 pg (ref 26.0–34.0)
MCHC: 34.3 g/dL (ref 30.0–36.0)
MCV: 92 fL (ref 80.0–100.0)
Monocytes Absolute: 0.4 10*3/uL (ref 0.1–1.0)
Monocytes Relative: 5 %
Neutro Abs: 4.5 10*3/uL (ref 1.7–7.7)
Neutrophils Relative %: 63 %
Platelets: 113 10*3/uL — ABNORMAL LOW (ref 150–400)
RBC: 4.25 MIL/uL (ref 4.22–5.81)
RDW: 12.3 % (ref 11.5–15.5)
WBC: 7.2 10*3/uL (ref 4.0–10.5)
nRBC: 0 % (ref 0.0–0.2)

## 2021-04-20 LAB — BASIC METABOLIC PANEL
Anion gap: 5 (ref 5–15)
BUN: 14 mg/dL (ref 6–20)
CO2: 25 mmol/L (ref 22–32)
Calcium: 8.8 mg/dL — ABNORMAL LOW (ref 8.9–10.3)
Chloride: 112 mmol/L — ABNORMAL HIGH (ref 98–111)
Creatinine, Ser: 0.66 mg/dL (ref 0.61–1.24)
GFR, Estimated: >60 mL/min (ref >60)
Glucose, Bld: 132 mg/dL — ABNORMAL HIGH (ref 70–99)
Potassium: 4.9 mmol/L (ref 3.5–5.1)
Sodium: 142 mmol/L (ref 135–145)

## 2021-04-20 LAB — C3 COMPLEMENT: C3 Complement: 117 mg/dL (ref 82–167)

## 2021-04-20 LAB — MAGNESIUM: Magnesium: 2.1 mg/dL (ref 1.7–2.4)

## 2021-04-20 LAB — GLUCOSE, CAPILLARY
Glucose-Capillary: 107 mg/dL — ABNORMAL HIGH (ref 70–99)
Glucose-Capillary: 124 mg/dL — ABNORMAL HIGH (ref 70–99)
Glucose-Capillary: 153 mg/dL — ABNORMAL HIGH (ref 70–99)
Glucose-Capillary: 204 mg/dL — ABNORMAL HIGH (ref 70–99)

## 2021-04-20 LAB — C4 COMPLEMENT: Complement C4, Body Fluid: 26 mg/dL (ref 12–38)

## 2021-04-20 MED ORDER — PRAVASTATIN SODIUM 80 MG PO TABS: 80.0000 mg | ORAL_TABLET | Freq: Every day | ORAL | 0 refills | Status: AC

## 2021-04-20 MED ORDER — NYSTATIN 100000 UNIT/ML MT SUSP: 5.0000 mL | Freq: Four times a day (QID) | OROMUCOSAL | 0 refills | Status: AC

## 2021-04-20 MED ORDER — VITAMIN B-1 250 MG PO TABS: 250.0000 mg | ORAL_TABLET | Freq: Every day | ORAL | 0 refills | Status: AC

## 2021-04-20 MED ORDER — HUMULIN 70/30 (70-30) 100 UNIT/ML ~~LOC~~ SUSP: 15.0000 [IU] | Freq: Every day | SUBCUTANEOUS | 0 refills | Status: AC

## 2021-04-20 MED ORDER — TRAZODONE HCL 50 MG PO TABS
25.0000 mg | ORAL_TABLET | Freq: Every evening | ORAL | Status: DC | PRN
  Administered 2021-04-20: 01:00:00 25 mg via ORAL
  Filled 2021-04-20: qty 1

## 2021-04-20 MED ORDER — BENAZEPRIL HCL 20 MG PO TABS
20.0000 mg | ORAL_TABLET | Freq: Every day | ORAL | 0 refills | Status: AC
Start: 2021-04-20 — End: ?

## 2021-04-20 NOTE — Consult Note (Signed)
  60 year old man recovering from delirium.  Patient is being discharged today. Patient requested to see psychiatrist to ask a question regarding his competece evaluation.  Patient seen. Patient reporter that his brother is his POA and patient is willing to take it off from his brother. We discussed that patient of family members can file a petition in court challenging his current POA agent.  If the court finds the agent is not acting in the principal`s best interest, the court can revoke the POA. Patient expressed understanding.  Patient reports he is "ready for discharge". Reports good mood, denies feeling depressed, suicidal, homicidal. Does not express delusions, does not appear psychotic or gravely-disabled.

## 2021-04-20 NOTE — Discharge Summary (Signed)
Physician Discharge Summary  Patient ID: Isaac Morrison MRN: [id number] DOB/AGE: 1960/12/28 60 y.o.  Admit date: 04/12/2021 Discharge date: 04/20/2021  Admission Diagnoses:  Discharge Diagnoses:  Principal Problem:   Alcohol withdrawal delirium (HCC) Active Problems:   Acute renal failure (HCC)   Hypernatremia   Wernicke encephalopathy   Discharged Condition: good  Hospital Course:  Isaac Morrison a59 y.o.malewith a history of childhood osteosarcoma s/p AKA, lifelong tobacco use, alcoholism, T2DM, HLD, HTN who presented to the ED 4/29 with 1 week of worsening delirium found to have severe renal failure with hyperkalemia and acidosis. He was admitted to the ICU, underwent HD with improvement in metabolic parameters. Mentation has remained severely altered and ativan has been given for alcohol withdrawal. He was transferred to the medical floor 5/1 on hospitalist service as of 5/2. Sodium has trended up to 157and improving with D5W. Nephrology is on board. 5/4.Renal function had improved, however, patient still has significant encephalopathy, concerning for Wernicke's encephalopathy, start high-dose of thiamine.  #1. Acute renal failure secondary to dehydration. Hypernatremia secondary dehydration. Initial hyperkalemia, now hypokalemia. High anion gap metabolic acidosis. Hypophosphatemia. Hypomagnesemia. Conditions had improved.  #2. Alcohol abuse with alcohol withdrawal. Warnicke encephalopathy. Thrush. Patient condition is much better today.  No additional visual hallucination.    PT/OT has recommended nursing home placement, patient refused it.  I will continue home PT and OT and set up home care.  I have asked psychiatry to see him again before discharge.   #3.  Essential hypertension, hypertension emergency. Condition stable.  4.  Thrombocytopenia. Platelet count better, unlikely to be heparin-induced.   5.  Uncontrolled type 2 diabetes with  hyperglycemia Resume home insulin with reduced dose to 15 units daily.  Follow-up with PCP to adjust dose as outpatient.   Consults: psychiatry  Significant Diagnostic Studies:     Treatments: ativan, thiamine, IVF  Discharge Exam: Blood pressure (!) 137/92, pulse 68, temperature 97.9 F (36.6 C), temperature source Oral, resp. rate 17, height 5\' 8"  (1.727 m), weight 68.5 kg, SpO2 99 %. General appearance: alert and cooperative Resp: clear to auscultation bilaterally Cardio: regular rate and rhythm, S1, S2 normal, no murmur, click, rub or gallop GI: soft, non-tender; bowel sounds normal; no masses,  no organomegaly Extremities: extremities normal, atraumatic, no cyanosis or edema  Disposition: Discharge disposition: 01-Home or Self Care       Discharge Instructions    Diet - low sodium heart healthy   Complete by: As directed    Increase activity slowly   Complete by: As directed      Allergies as of 04/20/2021   No Known Allergies     Medication List    STOP taking these medications   collagenase ointment Commonly known as: SANTYL   dicloxacillin 500 MG capsule Commonly known as: DYNAPEN   fenofibrate 145 MG tablet Commonly known as: TRICOR   insulin aspart 100 UNIT/ML injection Commonly known as: novoLOG   morphine 30 MG tablet Commonly known as: MSIR     TAKE these medications   benazepril 20 MG tablet Commonly known as: LOTENSIN Take 1 tablet (20 mg total) by mouth daily.   dorzolamide-timolol 22.3-6.8 MG/ML ophthalmic solution Commonly known as: COSOPT Place 1 drop into both eyes 2 (two) times daily.   feeding supplement Liqd Take 237 mLs by mouth 2 (two) times daily with a meal.   HumuLIN 70/30 (70-30) 100 UNIT/ML injection Generic drug: insulin NPH-regular Human Inject 15 Units into the skin daily. What changed: how  much to take   nystatin 100000 UNIT/ML suspension Commonly known as: MYCOSTATIN Take 5 mLs (500,000 Units total) by mouth  4 (four) times daily.   omeprazole 20 MG capsule Commonly known as: PRILOSEC Take 20 mg by mouth daily.   pravastatin 80 MG tablet Commonly known as: PRAVACHOL Take 1 tablet (80 mg total) by mouth at bedtime.   prednisoLONE acetate 1 % ophthalmic suspension Commonly known as: PRED FORTE Place 1 drop into the right eye in the morning, at noon, in the evening, and at bedtime.   vitamin B-1 250 MG tablet Take 1 tablet (250 mg total) by mouth daily for 2 days.            Durable Medical Equipment  (From admission, onward)         Start     Ordered   04/20/21 1000  For home use only DME Walker  Once       Question:  Patient needs a walker to treat with the following condition  Answer:  Unsteady gait   04/20/21 7340          Follow-up Information    Dan Maker, MD Follow up in 1 week(s).   Specialty: Internal Medicine Contact information: [address] [place] [address] [phone]              35 minutes Signed: Marrion Coy 04/20/2021, 10:19 AM

## 2021-04-20 NOTE — Discharge Instructions (Signed)
Acute Kidney Injury, Adult  Acute kidney injury is a sudden worsening of kidney function. The kidneys are organs that have several jobs. They filter the blood to remove waste products and extra fluid. They also maintain a healthy balance of minerals and hormones in the body, which helps control blood pressure and keep bones strong. With this condition, your kidneys do not do their jobs as well as they should. This condition ranges from mild to severe. Over time, it may develop into long-lasting (chronic) kidney disease. Early detection and treatment may prevent acute kidney injury from developing into a chronic condition. What are the causes? Common causes of this condition include:  A problem with blood flow to the kidneys. This may be caused by: ? Low blood pressure (hypotension) or shock. ? Blood loss. ? Heart and blood vessel (cardiovascular) disease. ? Severe burns. ? Liver disease.  Direct damage to the kidneys. This may be caused by: ? Certain medicines. ? A kidney infection. ? Poisoning. ? Being around or in contact with toxic substances. ? A surgical wound. ? A hard, direct hit to the kidney area.  A sudden blockage of urine flow. This may be caused by: ? Cancer. ? Kidney stones. ? An enlarged prostate in males. What increases the risk? You are more likely to develop this condition if you:  Are older than age 59.  Are male.  Are hospitalized, especially if you are in critical condition.  Have certain conditions, such as: ? Chronic kidney disease. ? Diabetes. ? Coronary artery disease and heart failure. ? Pulmonary disease. ? Chronic liver disease. What are the signs or symptoms? Symptoms of this condition may not be obvious until the condition becomes severe. Symptoms of this condition can include:  Tiredness (lethargy) or difficulty staying awake.  Nausea or vomiting.  Swelling (edema) of the face, legs, ankles, or feet.  Problems with urination, such  as: ? Pain in the abdomen, or pain along the side of your stomach (flank). ? Producing little or no urine. ? Passing urine with a weak flow.  Muscle twitches and cramps, especially in the legs.  Confusion or trouble concentrating.  Loss of appetite.  Fever. How is this diagnosed? Your health care provider can diagnose this condition based on your symptoms, medical history, and a physical exam.  You may also have other tests, such as:  Blood tests.  Urine tests.  Imaging tests.  A test in which a sample of tissue is removed from the kidneys to be examined under a microscope (kidney biopsy). How is this treated? Treatment for this condition depends on the cause and how severe the condition is. In mild cases, treatment may not be needed. The kidneys may heal on their own. In more severe cases, treatment will involve:  Treating the cause of the kidney injury. This may involve changing any medicines you are taking or adjusting your dosage.  Fluids. You may need specialized IV fluids to balance your body's needs.  Having a catheter placed to drain urine and prevent blockages.  Preventing problems from occurring. This may mean avoiding certain medicines or procedures that can cause further injury to the kidneys. In some cases, treatment may also require:  A procedure to remove toxic wastes from the body (dialysis or continuous renal replacement therapy, CRRT).  Surgery. This may be done to repair a torn kidney or to remove the blockage from the urinary system. Follow these instructions at home: Medicines  Take over-the-counter and prescription medicines only as   told by your health care provider.  Do not take any new medicines without your health care provider's approval. Many medicines can worsen your kidney damage.  Do not take any vitamin and mineral supplements without your health care provider's approval. Many nutritional supplements can worsen your kidney  damage. Lifestyle  If your health care provider prescribed changes to your diet, follow them. You may need to decrease the amount of protein you eat.  Achieve and maintain a healthy weight. If you need help with this, ask your health care provider.  Start or continue an exercise plan. Try to exercise at least 30 minutes a day, 5 days a week.  Do not use any products that contain nicotine or tobacco, such as cigarettes, e-cigarettes, and chewing tobacco. If you need help quitting, ask your health care provider.   General instructions  Keep track of your blood pressure. Report changes in your blood pressure as told by your health care provider.  Stay up to date with your vaccines. Ask your health care provider which vaccines you need.  Keep all follow-up visits as told by your health care provider. This is important.   Where to find more information  [employer] of Kidney Patients: ResidentialShow.is  [employer]: www.kidney.org  [employer]: FightingMatch.com.ee  [facility]: ? www.lifeoptions.org ? www.kidneyschool.org Contact a health care provider if:  Your symptoms get worse.  You develop new symptoms. Get help right away if:  You develop symptoms of worsening kidney disease, which include: ? Headaches. ? Abnormally dark or light skin. ? Easy bruising. ? Frequent hiccups. ? Chest pain. ? Shortness of breath. ? End of menstruation in women. ? Seizures. ? Confusion or altered mental status. ? Abdominal or back pain. ? Itchiness.  You have a fever.  Your body is producing less urine.  You have pain or bleeding when you urinate. Summary  Acute kidney injury is a sudden worsening of kidney function.  Acute kidney injury can be caused by problems with blood flow to the kidneys, direct damage to the kidneys, and sudden blockage of urine flow.  Symptoms of this condition may not be obvious until it becomes severe.  Symptoms may include edema, lethargy, confusion, nausea or vomiting, and problems passing urine.  This condition can be diagnosed with blood tests, urine tests, and imaging tests. Sometimes a kidney biopsy is done to diagnose this condition.  Treatment for this condition often involves treating the underlying cause. It is treated with fluids, medicines, diet changes, dialysis, or surgery. This information is not intended to replace advice given to you by your health care provider. Make sure you discuss any questions you have with your health care provider. Document Revised: 10/11/2019 Document Reviewed: 10/11/2019 Elsevier Patient Education  2021 [employer]   Confusion Confusion is the inability to think with your usual speed or clarity. Confusion can be caused by many things. People who are confused often describe their thinking as cloudy or unclear. Confusion can also include feeling disoriented. This means you are unaware of where you are or who you are. You may also not know the date or time. When confused, you may have trouble remembering, paying attention, or making decisions. Some people also act aggressively when they are confused. In some cases, confusion may come on quickly. In other cases, it may develop slowly over time. Confusion may be caused by medical conditions such as:  Infections, such as a urinary tract infection (UTI).  Low levels of oxygen,  which can develop from conditions such as long-term lung disorders.  Decrease in brain function due to dementia and other conditions that affect the brain, such as seizures, strokes, brain tumors, or head injuries.  Mental health conditions, like panic attacks, anxiety, depression, and hallucinations. Confusion may also be caused by physical factors such as:  Loss of fluid (dehydration) or an imbalance of salts and minerals in the body (electrolytes).  Lack of certain nutrients like niacin, thiamine, or other B  vitamins.  Fever or hypothermia, which is a sudden drop in body temperature.  Low or high blood sugar.  Low or high blood pressure. Other causes include:  Lack of sleep or changes in routine or surroundings, such as when traveling or staying in a hospital.  Using too much alcohol, drugs, or medicine.  Side effects of medicines, or taking medicines that affect other medicines (drug interactions). Follow these instructions at home: Pay attention to your symptoms. Tell your health care provider about any changes or if you develop new symptoms. Follow these instructions to control or treat symptoms. Ask a family member or friend for help if needed. Medicines  Take over-the-counter and prescription medicines only as told by your health care provider.  Ask your health care provider about changing or stopping any medicines that may be causing your confusion.  Avoid pain medicines or sleep medicines until you have fully recovered.  Use a pillbox or an alarm to help you take the right medicines at the right time.   Lifestyle  Eat a balanced diet that includes fruits and vegetables.  Get enough sleep. For most adults, this is 7-9 hours each night.  Do not drink alcohol.  Do not become isolated. Spend time with other people and make plans for your days.  Do not drive until your health care provider says that it is safe to do so.  Do not use any products that contain nicotine or tobacco, such as cigarettes, e-cigarettes, and chewing tobacco. If you need help quitting, ask your health care provider.  Stop other activities that may increase your chances of getting hurt. These may include some work duties, sports activities, swimming, or bike riding. Ask your health care provider what activities are safe for you.   Tips for caregivers  Find out if the person is confused. Ask the person to state his or her name, age, and the date. If the person is unsure or answers incorrectly, he or she  may be confused and need assistance.  Always introduce yourself, no matter how well the person knows you. Remind the person of his or her location.  Place a calendar and clock near the person who is confused. Keep a regular schedule. Make sure the person has plenty of light during the day and sleep at night.  Talk about current events and plans for the day.  Keep the environment calm, quiet, and peaceful.  Help the person do the things that he or she is unable to do. These include: ? Taking medicines. ? Keeping medical appointments. ? Helping with household duties, including meal preparation. ? Running errands.  Get help if you need it. There are several support groups for caregivers. If the person you are helping needs more support, consider day care, extended-care programs, or a skilled nursing facility. The person's health care provider may be able to help evaluate these options. General instructions  Monitor yourself for any conditions you may have. These can include: ? Checking your blood glucose levels if you  have diabetes. ? Maintaining a healthy weight. ? Monitoring your blood pressure if you have hypertension. ? Monitoring your body temperature if you have a fever.  Keep all follow-up visits. This is important. Contact a health care provider if:  You have new symptoms or your symptoms get worse. Get help right away if you:  Feel that you are not able to care for yourself.  Develop severe headaches, repeated vomiting, seizures, blackouts, or slurred speech.  Have increasing confusion, weakness, numbness, restlessness, or personality changes.  Develop a loss of balance, have marked dizziness, feel uncoordinated, or fall.  Develop severe anxiety, or you have delusions or hallucinations. These symptoms may represent a serious problem that is an emergency. Do not wait to see if the symptoms will go away. Get medical help right away. Call your local emergency services (911  in the [place] Do not drive yourself to the hospital. Summary  Confusion is the inability to think with your usual speed or clarity. People who are confused often describe their thinking as cloudy or unclear.  Confusion can also include having trouble remembering, paying attention, or making decisions.  Confusion may come on quickly or develop slowly over time, depending on the cause. There are many different causes of confusion.  Ask for help from family members or friends if you are unable to take care of yourself. This information is not intended to replace advice given to you by your health care provider. Make sure you discuss any questions you have with your health care provider. Document Revised: 03/27/2020 Document Reviewed: 03/27/2020 Elsevier Patient Education  2021 [employer].

## 2021-04-22 LAB — KAPPA/LAMBDA LIGHT CHAINS
Kappa free light chain: 158.8 mg/L — ABNORMAL HIGH (ref 3.3–19.4)
Kappa, lambda light chain ratio: 1.68 — ABNORMAL HIGH (ref 0.26–1.65)
Lambda free light chains: 94.7 mg/L — ABNORMAL HIGH (ref 5.7–26.3)

## 2021-04-22 LAB — ANCA TITERS
Atypical P-ANCA titer: <1:20 {titer}
C-ANCA: <1:20 {titer}
P-ANCA: <1:20 {titer}

## 2021-04-22 LAB — MPO/PR-3 (ANCA) ANTIBODIES
ANCA Proteinase 3: <3.5 U/mL (ref 0.0–3.5)
Myeloperoxidase Abs: <9 U/mL (ref 0.0–9.0)

## 2021-07-20 IMAGING — DX DG CHEST 1V PORT
1 series · 1 of 1 positions shown · non-contrast
Comparison: 02/18/2017

CLINICAL DATA: Sepsis

EXAM:
PORTABLE CHEST 1 VIEW

[chest ap]
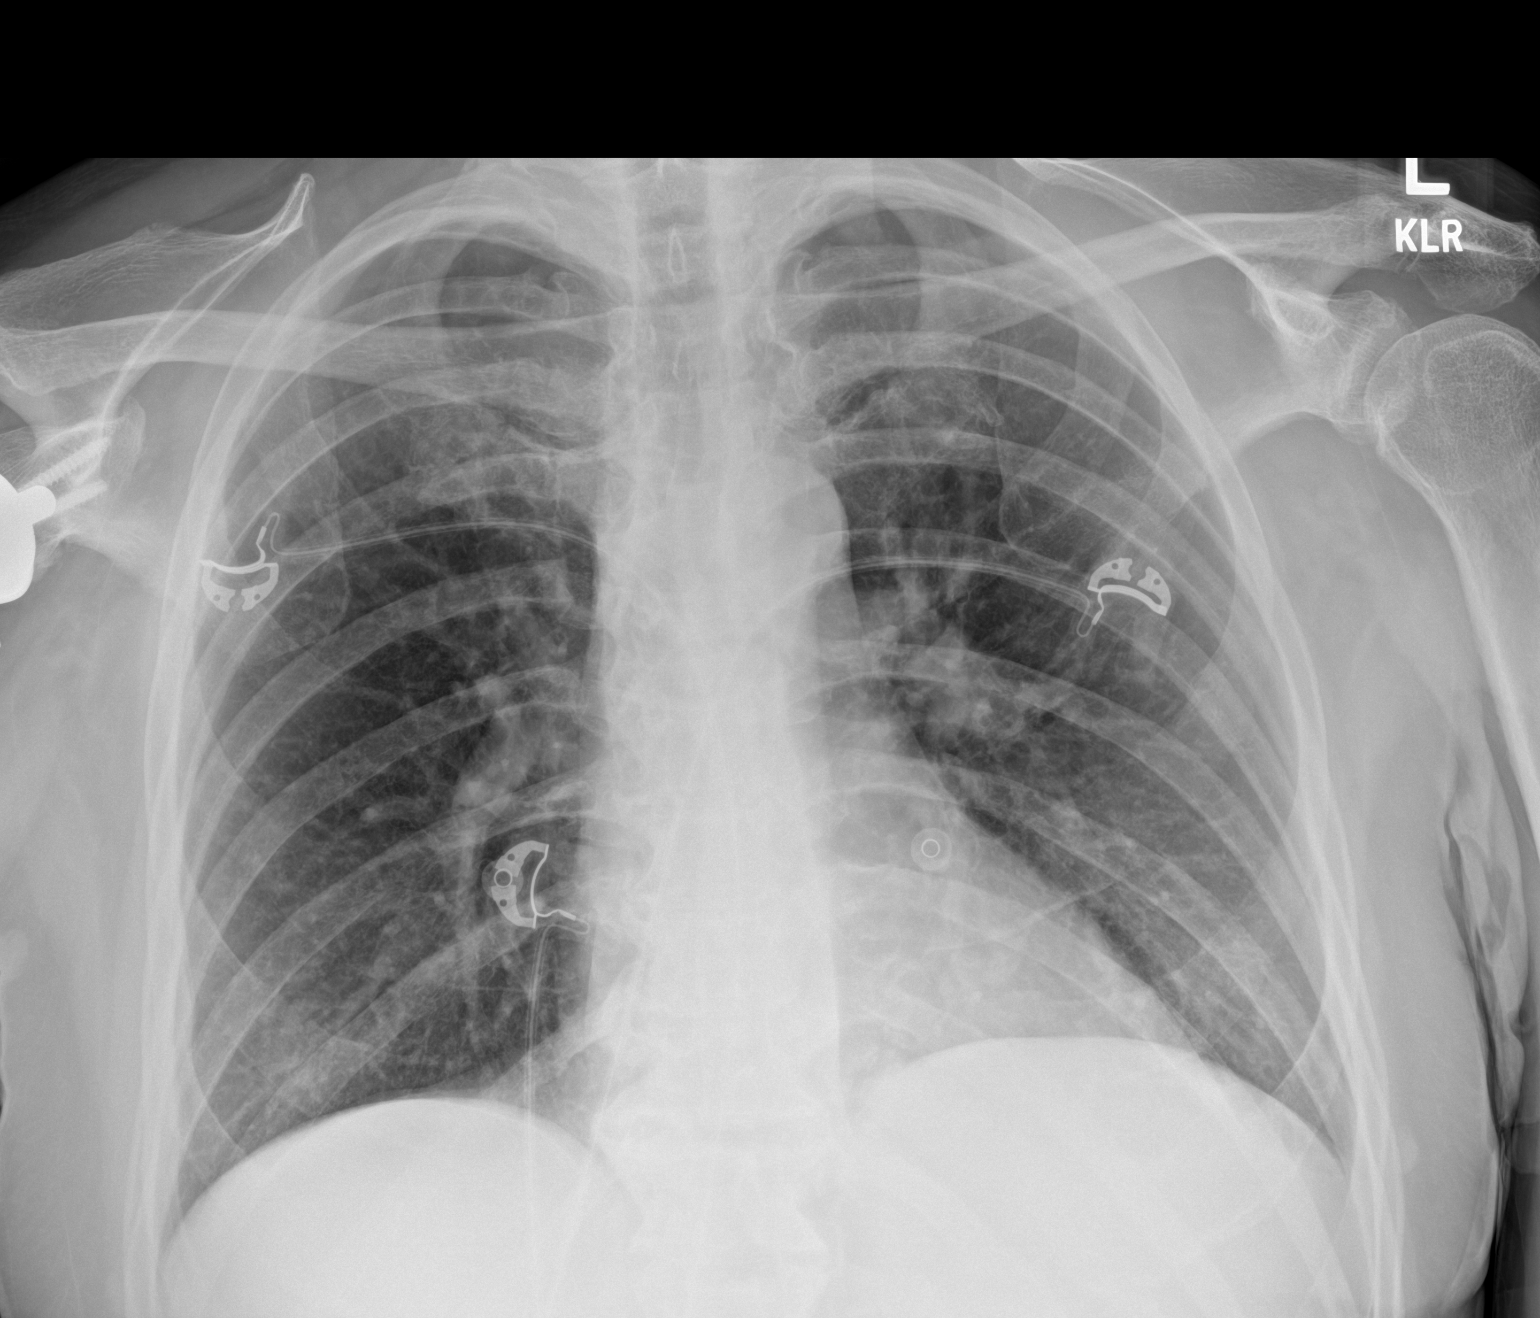

[1 of 1 positions shown; findings below may reference images not displayed]

FINDINGS: The heart size and mediastinal contours are within normal limits.
Both lungs are clear. The visualized skeletal structures are
unremarkable.
IMPRESSION: No active disease.

## 2021-07-21 IMAGING — CT CT HEAD W/O CM
3 series · 16 of 47 positions shown, 19 images · non-contrast
Comparison: None.

CLINICAL DATA: Diminished responsiveness.

EXAM:
CT HEAD WITHOUT CONTRAST
TECHNIQUE: Contiguous axial images were obtained from the base of the skull
through the vertex without intravenous contrast.

[Series 3: head wo · axial · 0.46mm/px · z∈[-154,-24]mm · 10 of 32 slices shown, 13 images]
[im 3/32  brain]
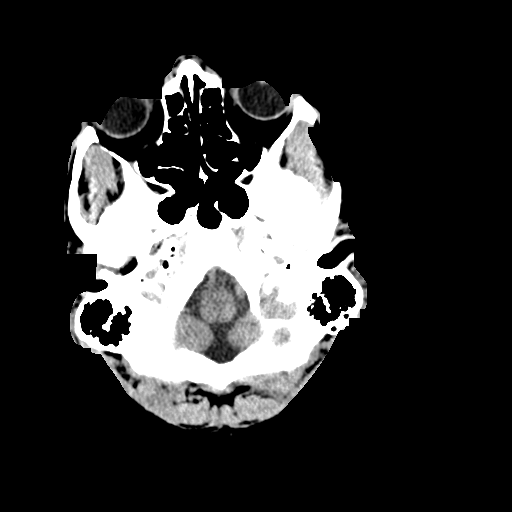
[im 3/32  bone]
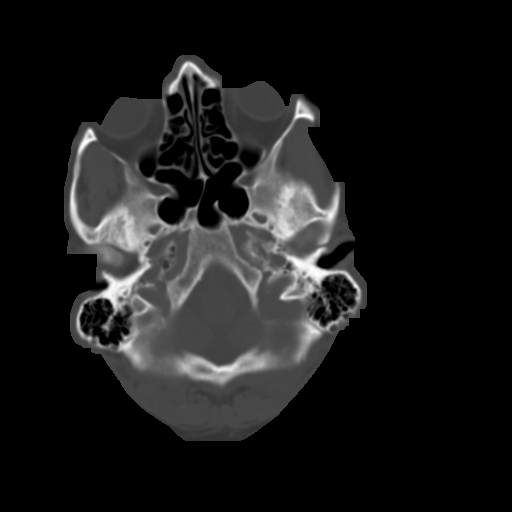
[im 6/32  brain]
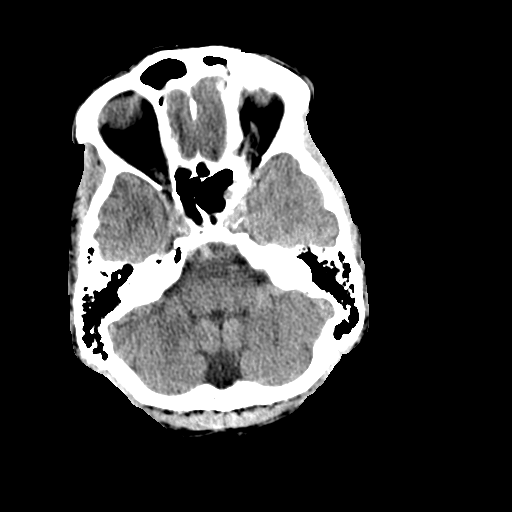
[im 9/32  brain]
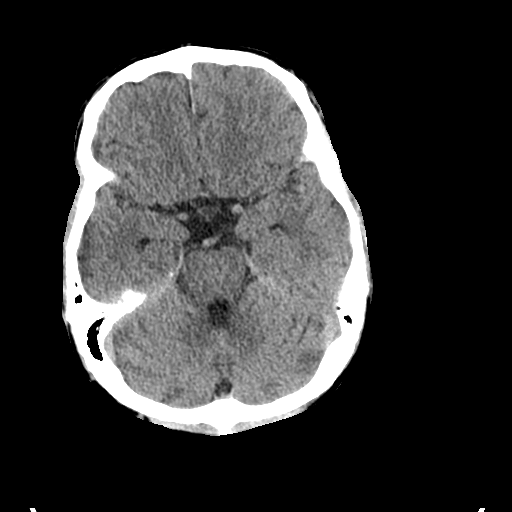
[im 11/32  brain]
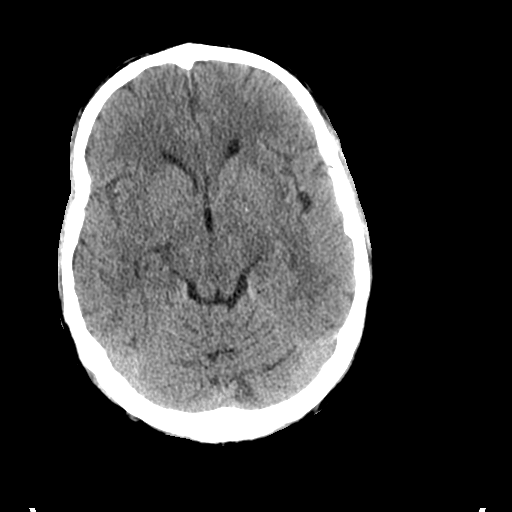
[im 14/32  brain]
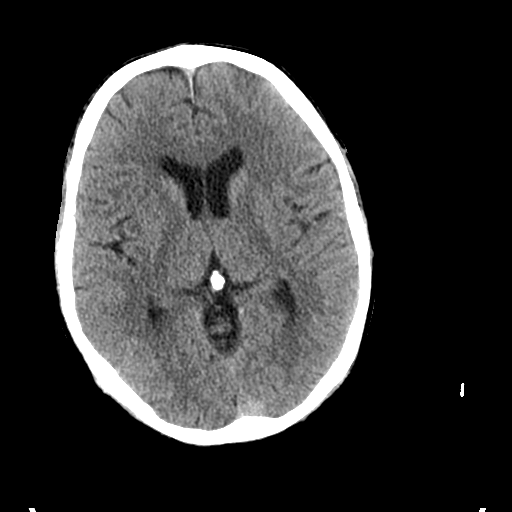
[im 14/32  bone]
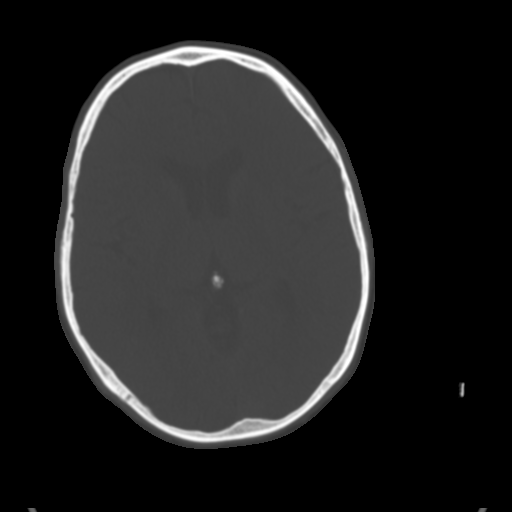
[im 18/32  brain]
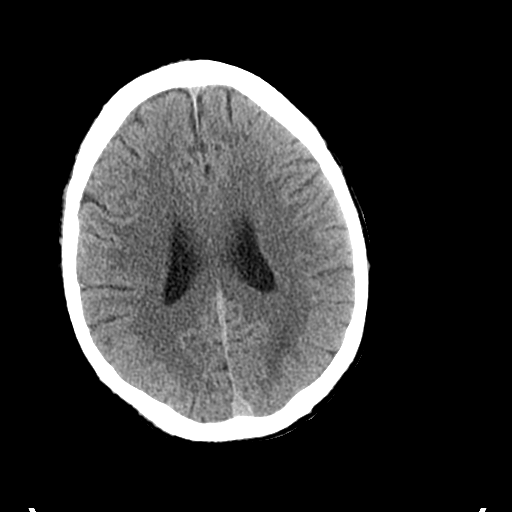
[im 21/32  brain]
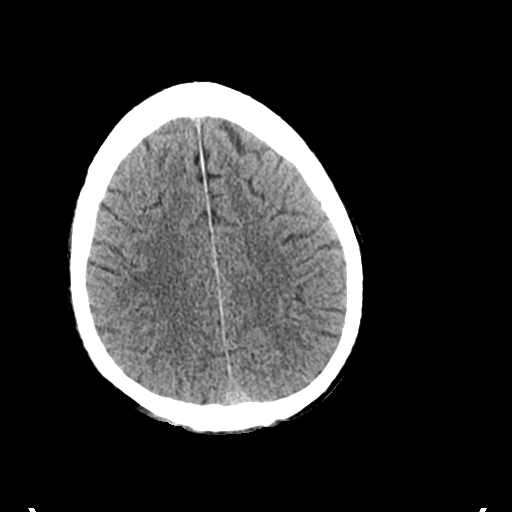
[im 24/32  brain]
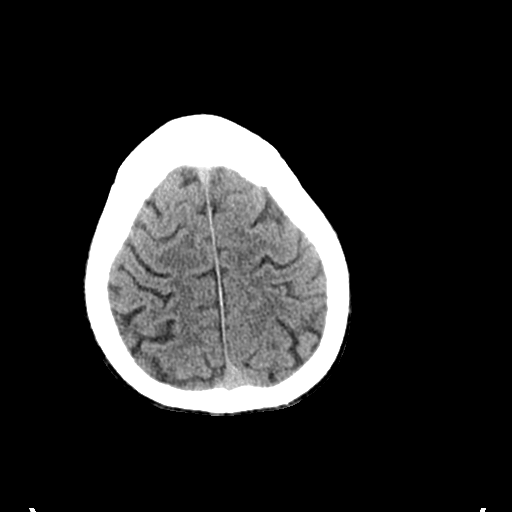
[im 26/32  brain]
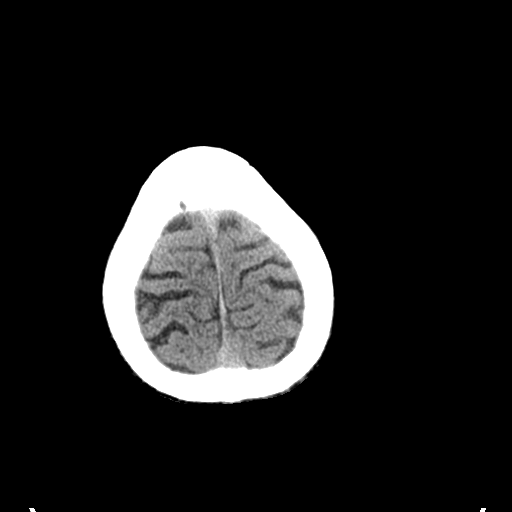
[im 26/32  bone]
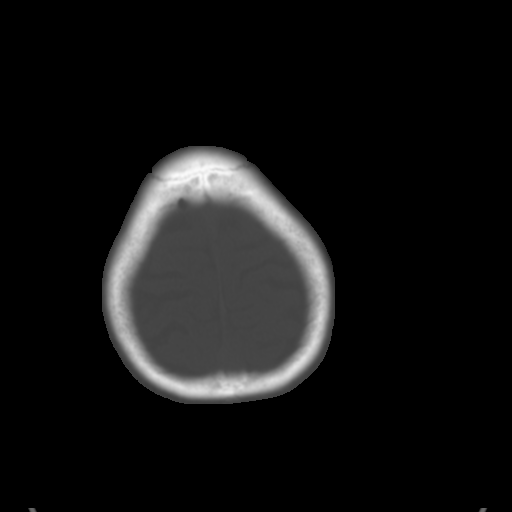
[im 29/32  brain]
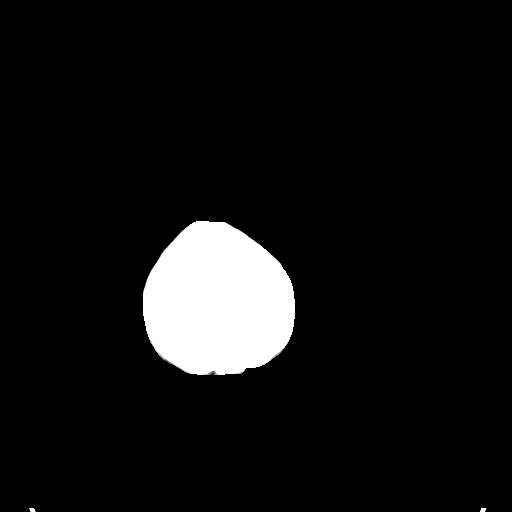

[Series 4: coronal soft tissue · coronal · 0.30mm/px · 3 of 65 slices shown]
[im 22/65  brain]
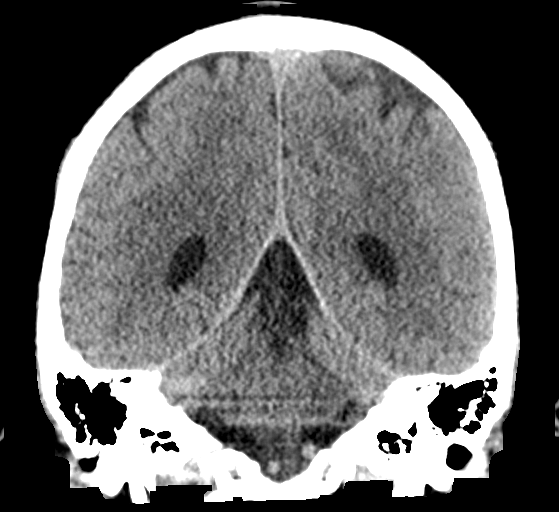
[im 29/65  brain]
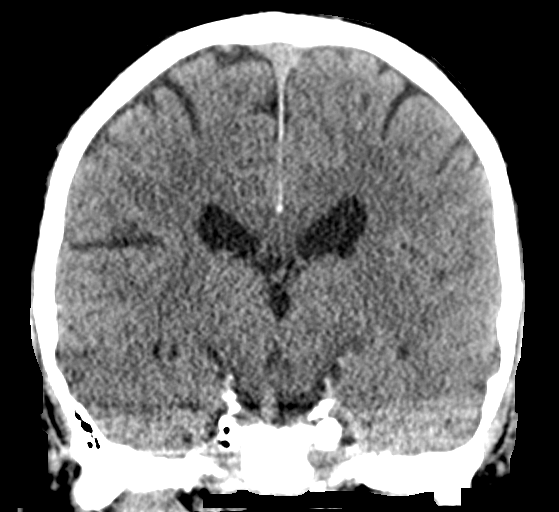
[im 36/65  brain]
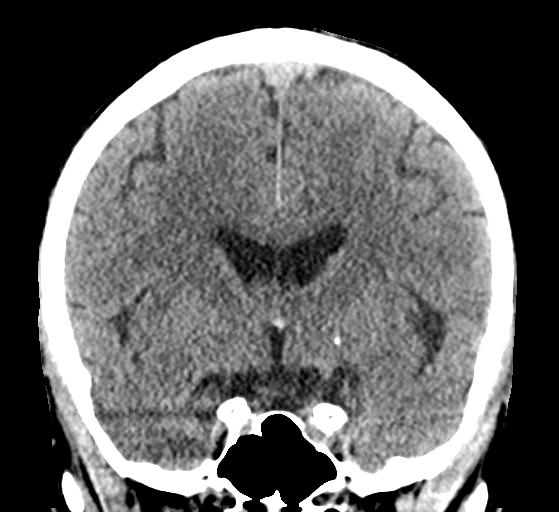

[Series 5: sagittal soft tissue · sagittal · 0.30mm/px · 3 of 56 slices shown]
[im 19/56  brain]
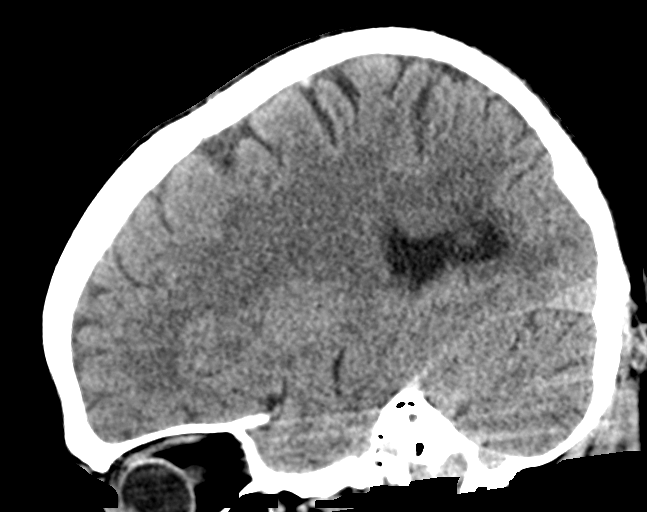
[im 28/56  brain]
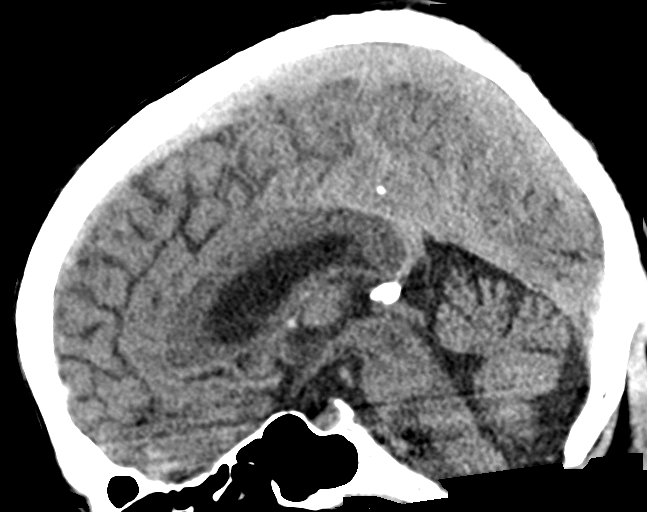
[im 37/56  brain]
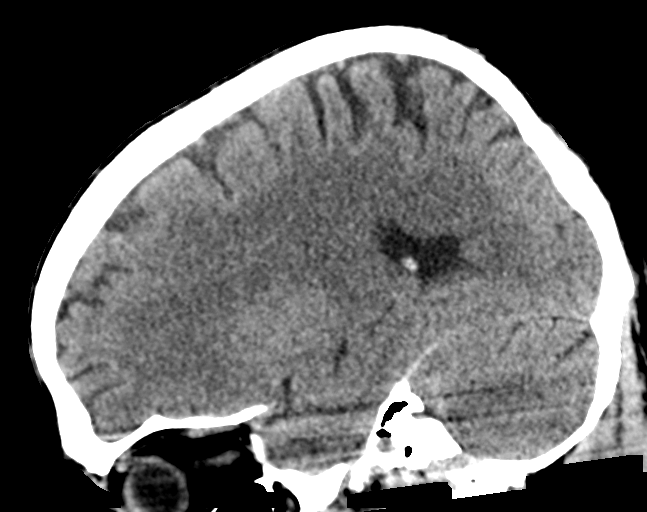

[16 of 47 positions shown; findings below may reference images not displayed]

FINDINGS: Brain: The brain shows a normal appearance without evidence of
malformation, atrophy, old or acute small or large vessel
infarction, mass lesion, hemorrhage, hydrocephalus or extra-axial
collection.

Vascular: There is atherosclerotic calcification of the major
vessels at the base of the brain.

Skull: Normal.  No traumatic finding.  No focal bone lesion.

Sinuses/Orbits: Sinuses are clear. Orbits appear normal. Mastoids
are clear.

Other: None significant
IMPRESSION: Normal examination for age.

## 2023-03-26 ENCOUNTER — Emergency Department: Payer: Medicare Other

## 2023-03-26 ENCOUNTER — Other Ambulatory Visit: Payer: Self-pay

## 2023-03-26 ENCOUNTER — Emergency Department
Admission: EM | Admit: 2023-03-26 | Discharge: 2023-03-27 | Disposition: A | Discharge status: Home / Self Care | Payer: Medicare Other | Attending: Emergency Medicine | Admitting: Emergency Medicine

## 2023-03-26 DIAGNOSIS — E119 Type 2 diabetes mellitus without complications: Secondary | ICD-10-CM | POA: Diagnosis not present

## 2023-03-26 DIAGNOSIS — I10 Essential (primary) hypertension: Secondary | ICD-10-CM | POA: Diagnosis not present

## 2023-03-26 DIAGNOSIS — R29810 Facial weakness: Secondary | ICD-10-CM | POA: Diagnosis present

## 2023-03-26 DIAGNOSIS — R519 Headache, unspecified: Secondary | ICD-10-CM | POA: Insufficient documentation

## 2023-03-26 DIAGNOSIS — R4781 Slurred speech: Secondary | ICD-10-CM | POA: Insufficient documentation

## 2023-03-26 DIAGNOSIS — Z794 Long term (current) use of insulin: Secondary | ICD-10-CM | POA: Diagnosis not present

## 2023-03-26 DIAGNOSIS — G510 Bell's palsy: Secondary | ICD-10-CM | POA: Insufficient documentation

## 2023-03-26 DIAGNOSIS — B0221 Postherpetic geniculate ganglionitis: Secondary | ICD-10-CM | POA: Diagnosis not present

## 2023-03-26 LAB — CBC
HCT: 50.1 % (ref 39.0–52.0)
Hemoglobin: 17.6 g/dL — ABNORMAL HIGH (ref 13.0–17.0)
MCH: 30.8 pg (ref 26.0–34.0)
MCHC: 35.1 g/dL (ref 30.0–36.0)
MCV: 87.7 fL (ref 80.0–100.0)
Platelets: 185 10*3/uL (ref 150–400)
RBC: 5.71 MIL/uL (ref 4.22–5.81)
RDW: 12.1 % (ref 11.5–15.5)
WBC: 5.3 10*3/uL (ref 4.0–10.5)
nRBC: 0 % (ref 0.0–0.2)

## 2023-03-26 LAB — COMPREHENSIVE METABOLIC PANEL
ALT: 15 U/L (ref 0–44)
AST: 21 U/L (ref 15–41)
Albumin: 4.6 g/dL (ref 3.5–5.0)
Alkaline Phosphatase: 62 U/L (ref 38–126)
Anion gap: 14 (ref 5–15)
BUN: 14 mg/dL (ref 6–20)
CO2: 23 mmol/L (ref 22–32)
Calcium: 9.5 mg/dL (ref 8.9–10.3)
Chloride: 96 mmol/L — ABNORMAL LOW (ref 98–111)
Creatinine, Ser: 0.73 mg/dL (ref 0.61–1.24)
GFR, Estimated: >60 mL/min (ref >60)
Glucose, Bld: 153 mg/dL — ABNORMAL HIGH (ref 70–99)
Potassium: 3.8 mmol/L (ref 3.5–5.1)
Sodium: 133 mmol/L — ABNORMAL LOW (ref 135–145)
Total Bilirubin: 1.5 mg/dL — ABNORMAL HIGH (ref 0.3–1.2)
Total Protein: 8.2 g/dL — ABNORMAL HIGH (ref 6.5–8.1)

## 2023-03-26 LAB — DIFFERENTIAL
Abs Immature Granulocytes: 0.01 10*3/uL (ref 0.00–0.07)
Basophils Absolute: 0 10*3/uL (ref 0.0–0.1)
Basophils Relative: 1 %
Eosinophils Absolute: 0 10*3/uL (ref 0.0–0.5)
Eosinophils Relative: 0 %
Immature Granulocytes: 0 %
Lymphocytes Relative: 26 %
Lymphs Abs: 1.4 10*3/uL (ref 0.7–4.0)
Monocytes Absolute: 0.3 10*3/uL (ref 0.1–1.0)
Monocytes Relative: 5 %
Neutro Abs: 3.7 10*3/uL (ref 1.7–7.7)
Neutrophils Relative %: 68 %

## 2023-03-26 LAB — ETHANOL: Alcohol, Ethyl (B): <10 mg/dL (ref <10)

## 2023-03-26 LAB — CBG MONITORING, ED: Glucose-Capillary: 166 mg/dL — ABNORMAL HIGH (ref 70–99)

## 2023-03-26 LAB — PROTIME-INR
INR: 1.1 (ref 0.8–1.2)
Prothrombin Time: 14.2 seconds (ref 11.4–15.2)

## 2023-03-26 LAB — APTT: aPTT: 30 seconds (ref 24–36)

## 2023-03-26 MED ORDER — SODIUM CHLORIDE 0.9% FLUSH: 3.0000 mL | Freq: Once | INTRAVENOUS | Status: DC

## 2023-03-26 NOTE — ED Triage Notes (Signed)
Pt reports R sided h/a since Sunday without fall or injury. Reports last night "I started to feel funny" and that ~1000 am today a R sided facial droop onset with slurred speech. Pt noted to have difficulty with articulation in triage and R droop noted. Pt reports difficulty swallowing but is noted to be maintaining his secretions at this time. Pt alert and oriented following commands. Pt pupils are equal, round, sluggish. Pt denies taking any of his medications this morning. States took all medicines yesterday. Pt HTN in triage with hx of same. Pt voice noted to be moist when speaking.   RN spoke with MD Derrill Kay on phone, requesting physician evaluation of pt in triage. Per provider via phone, no activation of code stroke at this time.

## 2023-03-27 MED ORDER — ARTIFICIAL TEARS OPHTHALMIC OINT: TOPICAL_OINTMENT | OPHTHALMIC | 0 refills | Status: AC | PRN

## 2023-03-27 MED ORDER — VALACYCLOVIR HCL 500 MG PO TABS
1000.0000 mg | ORAL_TABLET | Freq: Once | ORAL | Status: AC
  Administered 2023-03-27: 1000 mg via ORAL
  Filled 2023-03-27: qty 2

## 2023-03-27 MED ORDER — ACETAMINOPHEN 500 MG PO TABS
1000.0000 mg | ORAL_TABLET | Freq: Once | ORAL | Status: AC
  Administered 2023-03-27: 1000 mg via ORAL
  Filled 2023-03-27: qty 2

## 2023-03-27 MED ORDER — VALACYCLOVIR HCL 1 G PO TABS: 2000.0000 mg | ORAL_TABLET | Freq: Two times a day (BID) | ORAL | 0 refills | Status: AC

## 2023-03-27 MED ORDER — PREDNISONE 20 MG PO TABS
60.0000 mg | ORAL_TABLET | Freq: Once | ORAL | Status: AC
  Administered 2023-03-27: 60 mg via ORAL
  Filled 2023-03-27: qty 3

## 2023-03-27 MED ORDER — PREDNISONE 10 MG PO TABS: 60.0000 mg | ORAL_TABLET | ORAL | 0 refills | Status: AC

## 2023-03-27 NOTE — ED Provider Notes (Signed)
[facility] Provider Note    Event Date/Time   First MD Initiated Contact with Patient 03/26/23 2345     (approximate)   History   possible stroke   HPI  Isaac Morrison is a 62 y.o. male with history of hypertension, diabetes, hyperlipidemia, previous amputation of the right lower extremity who presents to the emergency department with right-sided facial droop, right-sided headache.  Patient states that he went to bed last night around midnight in his normal state of health and woke up around 9 AM with facial droop.  Unable to completely close the right eye.  No eye pain or vision changes.  Has noticed some crusted lesions in the right ear.  Has not had a shingles vaccination.  No fever.  No numbness, other focal weakness.  Able to ambulate at his baseline.   History provided by patient, friend.    Past Medical History:  Diagnosis Date   Diabetes mellitus without complication    Glaucoma    Hyperlipidemia    Hypertension     Past Surgical History:  Procedure Laterality Date   REPLACEMENT TOTAL KNEE BILATERAL Bilateral    Skin grafts     TOTAL SHOULDER REPLACEMENT Right     MEDICATIONS:  Prior to Admission medications   Medication Sig Start Date End Date Taking? Authorizing Provider  benazepril (LOTENSIN) 20 MG tablet Take 1 tablet (20 mg total) by mouth daily. 04/20/21   Marrion Coy, MD  dorzolamide-timolol (COSOPT) 22.3-6.8 MG/ML ophthalmic solution Place 1 drop into both eyes 2 (two) times daily. Patient not taking: Reported on 04/16/2021 01/16/17   [provider]  feeding supplement, ENSURE ENLIVE, (ENSURE ENLIVE) LIQD Take 237 mLs by mouth 2 (two) times daily with a meal. Patient not taking: No sig reported 02/20/17   Alford Highland, MD  HUMULIN 70/30 (70-30) 100 UNIT/ML injection Inject 15 Units into the skin daily. 04/20/21   Marrion Coy, MD  nystatin (MYCOSTATIN) 100000 UNIT/ML suspension Take 5 mLs (500,000 Units total) by mouth 4  (four) times daily. 04/20/21   Marrion Coy, MD  omeprazole (PRILOSEC) 20 MG capsule Take 20 mg by mouth daily. 12/12/16   [provider]  pravastatin (PRAVACHOL) 80 MG tablet Take 1 tablet (80 mg total) by mouth at bedtime. 04/20/21 05/20/21  Marrion Coy, MD  prednisoLONE acetate (PRED FORTE) 1 % ophthalmic suspension Place 1 drop into the right eye in the morning, at noon, in the evening, and at bedtime. 02/27/21   [provider]    Physical Exam   Triage Vital Signs: ED Triage Vitals  Enc Vitals Group     BP 03/26/23 2012 (!) 162/106     Pulse Rate 03/26/23 2012 (!) 119     Resp 03/26/23 2012 20     Temp 03/26/23 2012 98.8 F (37.1 C)     Temp Source 03/26/23 2012 Oral     SpO2 03/26/23 2012 94 %     Weight 03/26/23 2013 160 lb (72.6 kg)     Height 03/26/23 2013  (1.727 m)     Head Circumference --      Peak Flow --      Pain Score 03/26/23 2012 7     Pain Loc --      Pain Edu? --      Excl. in GC? --     Most recent vital signs: Vitals:   03/26/23 2012  BP: (!) 162/106  Pulse: (!) 119  Resp: 20  Temp: 98.8 F (37.1 C)  SpO2: 94%    CONSTITUTIONAL: Alert, responds appropriately to questions.  Chronically ill-appearing HEAD: Normocephalic, atraumatic EYES: Conjunctivae clear, pupils appear equal, sclera nonicteric, patient is unable to completely close the right eye, no chemosis, no tearing, no subconjunctival hemorrhage, hypopyon or hyphema noted.  Extraocular movements intact. ENT: normal nose; moist mucous membranes, vesicular crusted lesions noted within the right external auditory canal, pinna but no involvement of the face, scalp or neck NECK: Supple, normal ROM CARD: RRR; S1 and S2 appreciated RESP: Normal chest excursion without splinting or tachypnea; breath sounds clear and equal bilaterally; no wheezes, no rhonchi, no rales, no hypoxia or respiratory distress, speaking full sentences ABD/GI: Non-distended; soft, non-tender, no rebound, no  guarding, no peritoneal signs BACK: The back appears normal EXT: Normal ROM in all joints; no deformity noted, no edema, 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   ED Results / Procedures / Treatments   LABS: (all labs ordered are listed, but only abnormal results are displayed) Labs Reviewed  CBC - Abnormal; Notable for the following components:      Result Value   Hemoglobin 17.6 (*)    All other components within normal limits  COMPREHENSIVE METABOLIC PANEL - Abnormal; Notable for the following components:   Sodium 133 (*)    Chloride 96 (*)    Glucose, Bld 153 (*)    Total Protein 8.2 (*)    Total Bilirubin 1.5 (*)    All other components within normal limits  CBG MONITORING, ED - Abnormal; Notable for the following components:   Glucose-Capillary 166 (*)    All other components within normal limits  PROTIME-INR  APTT  DIFFERENTIAL  ETHANOL  CBG MONITORING, ED     EKG:  EKG Interpretation  Date/Time:  Thursday March 26 2023 20:14:56 EDT Ventricular Rate:  118 PR Interval:  272 QRS Duration: 92 QT Interval:  346 QTC Calculation: 484 R Axis:   -29 Text Interpretation: Sinus tachycardia with 1st degree A-V block Minimal voltage criteria for LVH, may be normal variant ( Cornell product ) Septal infarct (cited on or before 12-Apr-2021) Abnormal ECG When compared with ECG of 12-Apr-2021 16:04, QRS duration has decreased Confirmed by Rochele Raring [phone]) on 03/26/2023 11:45:46 PM         RADIOLOGY: My personal review and interpretation of imaging: CT head unremarkable.  I have personally reviewed  all radiology reports.   CT HEAD WO CONTRAST  Result Date: 03/26/2023 CLINICAL DATA:  right-sided headaches for several days, initial encounter EXAM: CT HEAD WITHOUT CONTRAST TECHNIQUE: Contiguous axial images were obtained from the base of the skull through the vertex without intravenous contrast. RADIATION DOSE REDUCTION: This exam was performed according to the departmental dose-optimization program which includes automated exposure control, adjustment of the mA and/or kV according to patient size and/or use of iterative reconstruction technique. COMPARISON:  04/13/2021 FINDINGS: Brain: No evidence of acute infarction, hemorrhage, hydrocephalus, extra-axial collection or mass lesion/mass effect. Vascular: No hyperdense vessel or unexpected calcification. Skull: Normal. Negative for fracture or focal lesion. Sinuses/Orbits: No acute finding. Other: None. IMPRESSION: No acute intracranial abnormality noted. Electronically Signed   By:  Alcide Clever M.D.   On: 03/26/2023 20:39     PROCEDURES:  Critical Care performed: No      .1-3 Lead EKG Interpretation  Performed by: Zaidyn Claire, Layla Maw, DO Authorized by: Damyra Luscher, Layla Maw, DO     Interpretation: abnormal     ECG rate:  106   ECG rate assessment: tachycardic     Rhythm: sinus tachycardia     Ectopy: none     Conduction: normal       IMPRESSION / MDM / ASSESSMENT AND PLAN / ED COURSE  I reviewed the triage vital signs and the nursing notes.    Patient here with Ramsay Hunt, Bell's palsy.  The patient is on the cardiac monitor to evaluate for evidence of arrhythmia and/or significant heart rate changes.   DIFFERENTIAL DIAGNOSIS (includes but not limited to):   Ramsay Hunt, Bell's palsy, low suspicion for intracranial hemorrhage, CVA, mass   Patient's presentation is most consistent with acute complicated illness / injury requiring diagnostic workup.   PLAN: Labs obtained from triage are unremarkable.  No leukocytosis, normal  electrolytes, negative ethanol.  CT head reviewed and interpreted by myself and the radiologist and is unremarkable.  Discussed with patient that this is not a stroke and there is no central cause for his symptoms today.  He clearly has Ramsay Hunt with Bell's palsy as he is not able to fully close the right eye and his forehead is not spared.  Will start him on prednisone and Valtrex.  Will give Tylenol for headache.  Recommended Tylenol, Motrin over-the-counter.  Recommended an eye patch since he is not able to fully close his eye to prevent corneal abrasion.  Will discharge with Lacri-Lube.  He states he has a PCP and ophthalmologist for follow-up.   MEDICATIONS GIVEN IN ED: Medications  sodium chloride flush (NS) 0.9 % injection 3 mL (has no administration in time range)  acetaminophen (TYLENOL) tablet 1,000 mg (has no administration in time range)  predniSONE (DELTASONE) tablet 60 mg (has no administration in time range)  valACYclovir (VALTREX) tablet 1,000 mg (has no administration in time range)     ED COURSE:  At this time, I do not feel there is any life-threatening condition present. I reviewed all nursing notes, vitals, pertinent previous records.  All lab and urine results, EKGs, imaging ordered have been independently reviewed and interpreted by myself.  I reviewed all available radiology reports from any imaging ordered this visit.  Based on my assessment, I feel the patient is safe to be discharged home without further emergent workup and can continue workup as an outpatient as needed. Discussed all findings, treatment plan as well as usual and customary return precautions.  They verbalize understanding and are comfortable with this plan.  Outpatient follow-up has been provided as needed.  All questions have been answered.    CONSULTS:  none   OUTSIDE RECORDS REVIEWED: Reviewed last ophthalmology note at [facility] on 01/01/2022 and last office visit with PCP on  09/26/2021.       FINAL CLINICAL IMPRESSION(S) / ED DIAGNOSES   Final diagnoses:  Ramsay Hunt syndrome (geniculate herpes zoster)  Bell's palsy     Rx / DC Orders   ED Discharge Orders          Ordered    valACYclovir (VALTREX) 1000 MG tablet  2 times daily        03/27/23 0016    predniSONE (DELTASONE) 10 MG tablet  As directed  03/27/23 0016    artificial tears (LACRILUBE) OINT ophthalmic ointment  Every 4 hours PRN        03/27/23 0016             Note:  This document was prepared using Dragon voice recognition software and may include unintentional dictation errors.   Astaria Nanez, Layla Maw, DO 03/27/23 [phone]

## 2023-03-27 NOTE — Discharge Instructions (Addendum)
You may alternate Tylenol 1000 mg every 6 hours as needed for pain, fever and Ibuprofen 800 mg every 6-8 hours as needed for pain, fever.  Please take Ibuprofen with food.  Do not take more than 4000 mg of Tylenol (acetaminophen) in a 24 hour period.   Please take your antiviral medication (Valtrex) and steroid as prescribed.  You will need to monitor your blood sugar closely while on prednisone.  I recommend that you follow-up with your eye doctor.  I recommend that you purchase an over-the-counter eye patch to wear at night since you are not able to fully close your eye to prevent corneal abrasions.  Symptoms of facial paralysis can sometimes take weeks to months to improve/resolve but can be permanent.  Again this is not from a stroke but from infection from the chickenpox/shingles virus that is affecting the facial nerve.  I recommend close follow-up with your primary care doctor as well for continued management of the symptoms and pain control if Tylenol, Motrin is not sufficient controlling her pain.
